# Patient Record
Sex: Female | Born: 1980 | Race: Black or African American | Hispanic: No | Marital: Single | Smoking: Never smoker
Health system: Southern US, Community
[De-identification: ages and names within clinical notes are randomized; demographics above are authoritative.]

## PROBLEM LIST (undated history)

## (undated) ENCOUNTER — Emergency Department (HOSPITAL_COMMUNITY): Admission: EM

## (undated) DIAGNOSIS — D649 Anemia, unspecified: Secondary | ICD-10-CM

## (undated) DIAGNOSIS — M1990 Unspecified osteoarthritis, unspecified site: Secondary | ICD-10-CM

## (undated) DIAGNOSIS — G4730 Sleep apnea, unspecified: Secondary | ICD-10-CM

## (undated) DIAGNOSIS — R7303 Prediabetes: Secondary | ICD-10-CM

## (undated) DIAGNOSIS — J45909 Unspecified asthma, uncomplicated: Secondary | ICD-10-CM

## (undated) DIAGNOSIS — R011 Cardiac murmur, unspecified: Secondary | ICD-10-CM

## (undated) DIAGNOSIS — R519 Headache, unspecified: Secondary | ICD-10-CM

## (undated) DIAGNOSIS — K219 Gastro-esophageal reflux disease without esophagitis: Secondary | ICD-10-CM

## (undated) DIAGNOSIS — R609 Edema, unspecified: Secondary | ICD-10-CM

## (undated) DIAGNOSIS — I10 Essential (primary) hypertension: Secondary | ICD-10-CM

## (undated) DIAGNOSIS — F329 Major depressive disorder, single episode, unspecified: Secondary | ICD-10-CM

## (undated) DIAGNOSIS — R112 Nausea with vomiting, unspecified: Secondary | ICD-10-CM

## (undated) DIAGNOSIS — R51 Headache: Secondary | ICD-10-CM

## (undated) DIAGNOSIS — F32A Depression, unspecified: Secondary | ICD-10-CM

## (undated) DIAGNOSIS — Z9889 Other specified postprocedural states: Secondary | ICD-10-CM

## (undated) DIAGNOSIS — T7840XA Allergy, unspecified, initial encounter: Secondary | ICD-10-CM

## (undated) DIAGNOSIS — IMO0001 Reserved for inherently not codable concepts without codable children: Secondary | ICD-10-CM

## (undated) HISTORY — DX: Essential (primary) hypertension: I10

## (undated) HISTORY — DX: Unspecified osteoarthritis, unspecified site: M19.90

## (undated) HISTORY — DX: Major depressive disorder, single episode, unspecified: F32.9

## (undated) HISTORY — DX: Depression, unspecified: F32.A

## (undated) HISTORY — DX: Prediabetes: R73.03

## (undated) HISTORY — DX: Sleep apnea, unspecified: G47.30

## (undated) HISTORY — DX: Edema, unspecified: R60.9

## (undated) HISTORY — PX: WISDOM TOOTH EXTRACTION: SHX21

## (undated) HISTORY — PX: CHOLECYSTECTOMY: SHX55

## (undated) HISTORY — DX: Cardiac murmur, unspecified: R01.1

## (undated) HISTORY — DX: Allergy, unspecified, initial encounter: T78.40XA

---

## 2000-10-16 ENCOUNTER — Emergency Department (HOSPITAL_COMMUNITY): Admission: EM | Admit: 2000-10-16 | Discharge: 2000-10-16 | Payer: Self-pay

## 2001-03-20 ENCOUNTER — Emergency Department (HOSPITAL_COMMUNITY): Admission: EM | Admit: 2001-03-20 | Discharge: 2001-03-20 | Payer: Self-pay | Admitting: Internal Medicine

## 2002-03-29 ENCOUNTER — Emergency Department (HOSPITAL_COMMUNITY): Admission: EM | Admit: 2002-03-29 | Discharge: 2002-03-30 | Payer: Self-pay | Admitting: Emergency Medicine

## 2003-04-25 ENCOUNTER — Emergency Department (HOSPITAL_COMMUNITY): Admission: EM | Admit: 2003-04-25 | Discharge: 2003-04-25 | Payer: Self-pay | Admitting: Emergency Medicine

## 2004-03-05 ENCOUNTER — Encounter: Admission: RE | Admit: 2004-03-05 | Discharge: 2004-06-03 | Payer: Self-pay | Admitting: Family Medicine

## 2004-11-28 ENCOUNTER — Emergency Department (HOSPITAL_COMMUNITY): Admission: EM | Admit: 2004-11-28 | Discharge: 2004-11-28 | Payer: Self-pay | Admitting: Emergency Medicine

## 2005-03-03 ENCOUNTER — Other Ambulatory Visit: Admission: RE | Admit: 2005-03-03 | Discharge: 2005-03-03 | Payer: Self-pay | Admitting: Family Medicine

## 2005-06-19 ENCOUNTER — Encounter: Admission: RE | Admit: 2005-06-19 | Discharge: 2005-06-19 | Payer: Self-pay | Admitting: Allergy and Immunology

## 2006-11-10 ENCOUNTER — Inpatient Hospital Stay (HOSPITAL_COMMUNITY): Admission: AD | Admit: 2006-11-10 | Discharge: 2006-11-10 | Payer: Self-pay | Admitting: *Deleted

## 2007-02-15 ENCOUNTER — Inpatient Hospital Stay (HOSPITAL_COMMUNITY): Admission: AD | Admit: 2007-02-15 | Discharge: 2007-02-20 | Payer: Self-pay | Admitting: Obstetrics and Gynecology

## 2009-02-01 ENCOUNTER — Emergency Department (HOSPITAL_COMMUNITY): Admission: EM | Admit: 2009-02-01 | Discharge: 2009-02-01 | Payer: Self-pay | Admitting: Emergency Medicine

## 2010-12-23 ENCOUNTER — Encounter: Payer: Self-pay | Admitting: Family Medicine

## 2011-04-18 NOTE — Discharge Summary (Signed)
Amber Daniels, Amber Daniels            ACCOUNT NO.:  [id number]   MEDICAL RECORD NO.:  [id number]          PATIENT TYPE:  INP   LOCATION:  9124                          FACILITY:  [facility]   PHYSICIAN:  Maxie Better, M.D.DATE OF BIRTH:  23-Aug-1981   DATE OF ADMISSION:  02/15/2007  DATE OF DISCHARGE:  02/20/2007                               DISCHARGE SUMMARY   ADMISSION DIAGNOSES:  1. Post dates.  2. Gestational hypertension   DISCHARGE DIAGNOSES:  1. Post dates, delivered.  2. Gestational hypertension.  3. Postoperative anemia.  4. Arrest of dilatation.   PROCEDURE:  Primary cesarean section Kerr hysterotomy.   HISTORY OF PRESENT ILLNESS:  This 30 year old G1, P0 female at 75 and  1/[redacted] weeks gestation who was admitted on February 15, 2007 for induction of  labor secondary to elevated blood pressure and nonreactive nonstress  test.  The patient was 41 weeks and 1 day, blood pressure was 150/100,  cervix was 1, 50%, and -3.  Her prenatal course up to that point was  remarkable.   HOSPITAL COURSE:  The patient was admitted.  PIH labs were obtained,  which were normal.  Cervidil was used.  A low dose Pitocin was also  subsequently used.  The patient subsequently had an epidural for pain  management.  She had a protracted active phase.  She had artificial  rupture of membranes.  Light meconium fluid had been noted.  Internal  monitors were placed.  The patient progressed to 5 cm, edematous, -3, -2  vertex presentation, meconium fluid, contracting every 2 minutes.  Cesarean section was recommended for arrest of dilatation.  Primary C-  section was performed.  Live female from the left occiput posterior  presentation was delivered.  Apgars of 9 and 9.  Normal tubes and  ovaries were noted at the time of surgery.  The patient had an  uncomplicated postoperative course.  Her postoperative labs were notable  for acute blood loss related anemia.  Her hemoglobin was 7.9, white  count of 10,  hematocrit 24.2, platelet count of 169,000.  The patient  was asymptomatic and therefore not transfused.  By postoperative day #3,  the patient had was tolerating a regular diet.  She had iron  supplementation.  Her incision showed no erythema, induration, or  exudate.  She was deemed well  to be discharged home.  Her blood  pressures did not have any problems during the labor or postoperatively.   DISPOSITION:  Home.   CONDITION:  Stable.   DISCHARGE MEDICATIONS:  1. Percocet 1 tablet every 4 hours p.r.n. pain.  2. Niferex Forte 1 p.o. daily.  3. Prenatal vitamins 1 p.o. daily.  4. Motrin 800 mg every 8 hours p.r.n. pain.   DISCHARGE INSTRUCTIONS:  Per the postpartum booklet given.   FOLLOW-UP APPOINTMENT:  At [facility] for staple removal  on February 24, 2007 and for [facility] postpartum care 6 weeks.      Maxie Better, M.D.  Electronically Signed     Kaplan/MEDQ  D:  03/28/2007  T:  03/28/2007  Job:  [id number]

## 2011-04-18 NOTE — Op Note (Signed)
Amber Daniels, SABINA            ACCOUNT NO.:  [id number]   MEDICAL RECORD NO.:  [id number]          PATIENT TYPE:  INP   LOCATION:  9164                          [facility]ACILITY:  [facility]   PHYSICIAN:  Maxie Better, M.D.DATE OF BIRTH:  01-Nov-1981   DATE OF PROCEDURE:  DATE OF DISCHARGE:                               OPERATIVE REPORT   PREOPERATIVE DIAGNOSIS:  Arrest of dilatation, post dates.   PROCEDURE:  Primary  Cesarean section, Kerr hysterotomy.   POSTOPERATIVE DIAGNOSIS:  Arrest of dilatation, post dates, left occiput  posterior presentation.   ANESTHESIA:  Epidural.   SURGEON:  Maxie Better, M.D.   ASSISTANT:  Marlinda Mike, C.N.M.   PROCEDURE:  After adequate epidural anesthesia, the patient was placed  in the  supine position with the left lateral tilt.  She was sterilely  prepped and draped in the usual fashion, and indwelling Foley catheter  was already in place.  Marcaine 0.25% was injected along the planned  Pfannenstiel skin incision.  The Pfannenstiel skin incision was then  made, carried down to the rectus fascia.  The rectus fascia was opened  transversely.  The rectus fascia was carefully dissected off the rectus  muscle in a superior and infection fashion.  The rectus muscle was split  in the midline.  The parietal peritoneum was entered sharply and  extended.  The lower uterine segment was well developed.  Vesicouterine  peritoneum was opened transversely.  The bladder was then bluntly  dissected off the lower uterine segment.  It was placed inferiorly with  a bladder retractor.  A curvilinear low transverse uterine incision was  then made and extended bilaterally with bandage scissors.  Subsequent  delivery of a live female from the left occiput posterior presentation,  was performed.  The baby was noted to have a large caput.  Meconium  previously noted.  The cord was clamped and cut.  The baby was  transferred to the awaiting pediatricians who  assigned  apgars of 9 and  9 at one and five.  The placenta was spontaneous, intact.  The uterine  cavity was cleaned of debris.  The uterine incision had no extension.  The uterine incision was closed in two layers, the first layer with 0  Monocryl running locked stitch, second layer imbricated 0 Monocryl  suture.  Several figure-of-eight sutures have been placed with good  hemostasis noted.  Small bleeders in the inferior aspect of the lower  uterine segment were cauterized.  The abdomen was then copiously  irrigated and suctioned of debris.  The paraicolic gutters were cleaned  of debris.  Normal tubes and ovaries were noted bilaterally.  The  parietal peritoneum was closed with 2-0 Vicryl.  The rectus fascia was  closed with 0 Vicryl x2.  Subcutaneous areas are approximated using a 2-  0 plain suture.  The skin is approximated using Ethicon staples.   SPECIMENS:  Placenta, not sent to pathology.   ESTIMATED BLOOD LOSS:  1000 cc.   INTRAOPERATIVE FLUIDS:  Crystalloid 2800 cc.   URINE OUTPUT:  150 cc of clear yellow urine.   Intraoperatively, the patient received  250 mcg of Hemabate due to  uterine atony noted.  Sponge and instrument counts x2 were correct.   COMPLICATIONS:  None.  The patient tolerated the procedure well.  She  was transferred to the recovery room in stable condition.      Maxie Better, M.D.  Electronically Signed     [place]ary/MEDQ  D:  02/17/2007  T:  02/17/2007  Job:  [id number]

## 2012-04-20 ENCOUNTER — Emergency Department (HOSPITAL_COMMUNITY): Payer: Medicaid Other

## 2012-04-20 ENCOUNTER — Emergency Department (HOSPITAL_COMMUNITY)
Admission: EM | Admit: 2012-04-20 | Discharge: 2012-04-20 | Disposition: A | Discharge status: Home / Self Care | Payer: Medicaid Other | Attending: Emergency Medicine | Admitting: Emergency Medicine

## 2012-04-20 ENCOUNTER — Encounter (HOSPITAL_COMMUNITY): Payer: Self-pay | Admitting: Physical Medicine and Rehabilitation

## 2012-04-20 DIAGNOSIS — S93409A Sprain of unspecified ligament of unspecified ankle, initial encounter: Secondary | ICD-10-CM | POA: Insufficient documentation

## 2012-04-20 DIAGNOSIS — S93609A Unspecified sprain of unspecified foot, initial encounter: Secondary | ICD-10-CM

## 2012-04-20 DIAGNOSIS — R937 Abnormal findings on diagnostic imaging of other parts of musculoskeletal system: Secondary | ICD-10-CM | POA: Insufficient documentation

## 2012-04-20 DIAGNOSIS — M79609 Pain in unspecified limb: Secondary | ICD-10-CM | POA: Insufficient documentation

## 2012-04-20 DIAGNOSIS — M25579 Pain in unspecified ankle and joints of unspecified foot: Secondary | ICD-10-CM | POA: Insufficient documentation

## 2012-04-20 DIAGNOSIS — W101XXA Fall (on)(from) sidewalk curb, initial encounter: Secondary | ICD-10-CM | POA: Insufficient documentation

## 2012-04-20 MED ORDER — HYDROCODONE-ACETAMINOPHEN 5-325 MG PO TABS
1.0000 | ORAL_TABLET | Freq: Once | ORAL | Status: AC
  Administered 2012-04-20: 1 via ORAL
  Filled 2012-04-20: qty 1

## 2012-04-20 MED ORDER — NAPROXEN 500 MG PO TABS: 500.0000 mg | ORAL_TABLET | Freq: Two times a day (BID) | ORAL | Status: AC

## 2012-04-20 MED ORDER — HYDROCODONE-ACETAMINOPHEN 5-325 MG PO TABS: 1.0000 | ORAL_TABLET | Freq: Four times a day (QID) | ORAL | Status: AC | PRN

## 2012-04-20 MED ORDER — IBUPROFEN 800 MG PO TABS
800.0000 mg | ORAL_TABLET | Freq: Once | ORAL | Status: AC
  Administered 2012-04-20: 800 mg via ORAL
  Filled 2012-04-20: qty 1

## 2012-04-20 NOTE — ED Notes (Signed)
Pt presents to department for evaluation of L foot and ankle pain. States she tripped and fell off curb this morning. Unable to wiggle digits at the time, pedal pulses present. No obvious deformity noted. 10/10 pain at the time. She is conscious alert and oriented x4. No signs of distress noted at present.

## 2012-04-20 NOTE — ED Provider Notes (Signed)
History   This chart was scribed for Shelda Jakes, MD by Brooks Sailors. The patient was seen in room STRE7/STRE7. Patient's care was started at 1626.   CSN: [id number]  Arrival date & time 04/20/12  1626   First MD Initiated Contact with Patient 04/20/12 1733      Chief Complaint  Patient presents with  . Foot Pain  . Ankle Pain    (Consider location/radiation/quality/duration/timing/severity/associated sxs/prior treatment) HPI  Amber Daniels is a 31 y.o. female who presents to the Emergency Department complaining of left foot and ankle pain onset this morning 8 hours ago. Pt was walking to the mailbox this morning when she fell and twisted left ankle. Pt says she was walking off the curb of the street when injury occurred. Pt says walking and movement make the pain worse.    No past medical history on file.  No past surgical history on file.  No family history on file.  History  Substance Use Topics  . Smoking status: Never Smoker   . Smokeless tobacco: Not on file  . Alcohol Use: Yes    OB History    Grav Para Term Preterm Abortions TAB SAB Ect Mult Living                  Review of Systems  Constitutional: Negative for fatigue.  HENT: Negative for sore throat.   Respiratory: Negative for cough and shortness of breath.   Cardiovascular: Negative for chest pain.  Gastrointestinal: Negative for nausea, vomiting, abdominal pain and diarrhea.  Genitourinary: Negative for dysuria.  Musculoskeletal: Negative for back pain.  Skin: Negative for rash.  Hematological: Negative for adenopathy.  All other systems reviewed and are negative.    Allergies  Review of patient's allergies indicates no known allergies.  Home Medications   Current Outpatient Rx  Name Route Sig Dispense Refill  . ACETAMINOPHEN 500 MG PO TABS Oral Take 1,500 mg by mouth every 6 (six) hours as needed. For pain.    Marland Kitchen HYDROCODONE-ACETAMINOPHEN 5-325 MG PO TABS Oral Take 1-2 tablets  by mouth every 6 (six) hours as needed for pain. 14 tablet 0  . NAPROXEN 500 MG PO TABS Oral Take 1 tablet (500 mg total) by mouth 2 (two) times daily. 14 tablet 0    BP 134/78  Pulse 77  Temp(Src) 98.4 F (36.9 C) (Oral)  Resp 16  SpO2 100%  LMP 03/29/2012  Physical Exam  Nursing note and vitals reviewed. Constitutional: She is oriented to person, place, and time. She appears well-developed and well-nourished.       obese  HENT:  Head: Normocephalic and atraumatic.  Eyes: Conjunctivae and EOM are normal. Pupils are equal, round, and reactive to light.  Neck: Normal range of motion. Neck supple.  Cardiovascular: Normal rate and regular rhythm.   Pulmonary/Chest: Effort normal and breath sounds normal.  Abdominal: Soft. Bowel sounds are normal.  Musculoskeletal: Normal range of motion.       DP pulse is 2+, cap refill 1 second, sensation intact, can move toes. Left foot  Neurological: She is alert and oriented to person, place, and time.  Skin: Skin is warm and dry.  Psychiatric: She has a normal mood and affect.    ED Course  Procedures (including critical care time)  Pt seen at 1745 Pt informed of xray results at 1851   Labs Reviewed - No data to display Dg Ankle Complete Left  04/20/2012  *RADIOLOGY REPORT*  Clinical Data: Injured  left ankle.  LEFT ANKLE COMPLETE - 3+ VIEW  Comparison: None.  Findings: The lateral ankle mortise is slightly widened.  Cannot exclude a ligamentous injury.  No acute ankle fracture.  The talus is intact.  IMPRESSION: Mild lateral ankle mortise widening.  Possible ligamentous injury.  Original Report Authenticated By: P. Loralie Champagne, M.D.   Ct Foot Left Wo Contrast  04/20/2012  *RADIOLOGY REPORT*  Clinical Data: Abnormal imaging of the left foot earlier, possible fracture involving the base of the second metatarsal.  CT OF THE LEFT FOOT WITHOUT CONTRAST  Technique:  Multidetector CT imaging was performed according to the standard protocol.  Multiplanar CT image reconstructions were also generated.  Comparison: Left foot x-rays obtained earlier same date.  Findings: No fracture identified involving the base of the second metatarsal or elsewhere involving the bones of the foot.  Ankle mortise intact, and no fractures identified about the ankle.  Joint spaces well preserved.  IMPRESSION: No fracture identified involving the base of the second metatarsal as questioned on earlier imaging.  No fractures elsewhere.  Original Report Authenticated By: Arnell Sieving, M.D.   Dg Foot Complete Left  04/20/2012  *RADIOLOGY REPORT*  Clinical Data: Larey Seat.  Left foot pain.  LEFT FOOT - COMPLETE 3+ VIEW  Comparison: None  Findings: The joint spaces are maintained.  Suspect fractures of the base of the second metatarsal.  No obvious Lisfranc's injury but CT suggested for further evaluation.  The metatarsal phalangeal joints are normal.  IMPRESSION:  Possible second metatarsal base fractures.  Recommend CT for further evaluation.  Original Report Authenticated By: P. Loralie Champagne, M.D.     1. Ankle sprain   2. Foot sprain       MDM  Followup with orthopedics.  CT scan shows no fracture to the foot.  Read your x-rays of the ankle were determined to be normal, possible ligamental injury, but no fracture.  Will treat as a bad ankle sprain foot sprain with crutches, ASO as needed and referral to orthopedics.      I personally performed the services described in this documentation, which was scribed in my presence. The recorded information has been reviewed and considered.     Shelda Jakes, MD 04/20/12 2024

## 2012-04-20 NOTE — Discharge Instructions (Signed)
Ankle Sprain An ankle sprain happens when the bands of tissue that hold the ankle bones together (ligaments) stretch too much and tear.  HOME CARE   Put ice on the ankle for 15 to 20 minutes, 3 to 4 times a day.   Put ice in a plastic bag.   Place a towel between your skin and the bag.   You may stop icing when the puffiness (swelling) goes down.   Raise (elevate) the injured ankle to lessen puffiness.   Use crutches if your doctor tells you to. Use them until you can walk without pain.   If a plaster splint was applied:   Rest the plaster splint on nothing harder than a pillow for 24 hours.   Do not put weight on it.   Do not get it wet.   Take it off to shower or bathe.   Follow up with your doctor.   Use an elastic wrap for support. Take the wrap off if the toes lose feeling (numb), tingle, or turn cold or blue.   If an air splint was applied:   Add or release air to make it comfortable.   Take it off at night and to shower and bathe.   Wiggle your toes while wearing the air splint.   Only take medicine as told by your doctor.   Do not drive until your doctor says it is okay.  GET HELP RIGHT AWAY IF:   You have more bruising, puffiness, or pain.   Your toes feel cold.   Your medicine does not help lessen your pain.   You are losing feeling in your toes or they turn blue.   You have severe pain.  MAKE SURE YOU:   Understand these instructions.   Will watch your condition.   Will get help right away if you are not doing well or get worse.  Document Released: 05/05/2008 Document Revised: 11/06/2011 Document Reviewed: 05/05/2008 [facility] Patient Information 2012 [place], [place].  Use crutches as needed.  X-rays and CAT scan of ankle, and foot without any evidence of acute fracture.  So, therefore, it is a bad sprain.  Followup with orthopedics.  Usually associate went to help support the ankle is okay to try to weight-bear and walk as tolerated.  Is too  uncomfortable.  Continue use of crutches.  He would need to give orthopedics a call for appointment time

## 2012-04-20 NOTE — ED Notes (Signed)
Unable to check temp due to pt having something cold to drink.

## 2012-04-20 NOTE — Progress Notes (Signed)
Orthopedic Tech Progress Note Patient Details:  Amber Daniels [birth year] [id number]  Other Ortho Devices Type of Ortho Device: ASO;Crutches Ortho Device Location: left ankle Ortho Device Interventions: Application   Nikki Dom 04/20/2012, 8:43 PM

## 2012-07-05 ENCOUNTER — Encounter (HOSPITAL_BASED_OUTPATIENT_CLINIC_OR_DEPARTMENT_OTHER): Payer: Self-pay | Admitting: *Deleted

## 2012-07-05 ENCOUNTER — Emergency Department (HOSPITAL_BASED_OUTPATIENT_CLINIC_OR_DEPARTMENT_OTHER)
Admission: EM | Admit: 2012-07-05 | Discharge: 2012-07-05 | Disposition: A | Discharge status: Home / Self Care | Payer: Medicaid Other | Attending: Emergency Medicine | Admitting: Emergency Medicine

## 2012-07-05 DIAGNOSIS — Z113 Encounter for screening for infections with a predominantly sexual mode of transmission: Secondary | ICD-10-CM | POA: Insufficient documentation

## 2012-07-05 DIAGNOSIS — J039 Acute tonsillitis, unspecified: Secondary | ICD-10-CM | POA: Insufficient documentation

## 2012-07-05 DIAGNOSIS — J45909 Unspecified asthma, uncomplicated: Secondary | ICD-10-CM | POA: Insufficient documentation

## 2012-07-05 HISTORY — DX: Unspecified asthma, uncomplicated: J45.909

## 2012-07-05 LAB — RAPID STREP SCREEN (MED CTR MEBANE ONLY): Streptococcus, Group A Screen (Direct): NEGATIVE

## 2012-07-05 MED ORDER — PENICILLIN G BENZATHINE 1200000 UNIT/2ML IM SUSP
1.2000 10*6.[IU] | Freq: Once | INTRAMUSCULAR | Status: AC
  Administered 2012-07-05: 1.2 10*6.[IU] via INTRAMUSCULAR
  Filled 2012-07-05: qty 2

## 2012-07-05 NOTE — ED Provider Notes (Signed)
History     CSN: [id number]  Arrival date & time 07/05/12  1300   First MD Initiated Contact with Patient 07/05/12 1404      Chief Complaint  Patient presents with  . Sore Throat    (Consider location/radiation/quality/duration/timing/severity/associated sxs/prior treatment) Patient is a 31 y.o. female presenting with pharyngitis. The history is provided by the patient.  Sore Throat This is a new problem. The current episode started yesterday. The problem occurs constantly. The problem has been gradually worsening. Associated symptoms include chills, myalgias and a sore throat. Pertinent negatives include no congestion, coughing, fever, nausea or rash. Associated symptoms comments: Feels generally achy, chills, sore throat that is worse today. No sick contacts. No cough.. The symptoms are aggravated by swallowing.    Past Medical History  Diagnosis Date  . Asthma     Past Surgical History  Procedure Date  . Cesarean section     No family history on file.  History  Substance Use Topics  . Smoking status: Never Smoker   . Smokeless tobacco: Not on file  . Alcohol Use: Yes    OB History    Grav Para Term Preterm Abortions TAB SAB Ect Mult Living                  Review of Systems  Constitutional: Positive for chills. Negative for fever.  HENT: Positive for sore throat. Negative for congestion and trouble swallowing.   Respiratory: Negative for cough.   Gastrointestinal: Negative for nausea.  Musculoskeletal: Positive for myalgias.  Skin: Negative for rash.    Allergies  Review of patient's allergies indicates no known allergies.  Home Medications   Current Outpatient Rx  Name Route Sig Dispense Refill  . ACETAMINOPHEN 500 MG PO TABS Oral Take 1,500 mg by mouth every 6 (six) hours as needed. For pain.    Marland Kitchen NAPROXEN 500 MG PO TABS Oral Take 1 tablet (500 mg total) by mouth 2 (two) times daily. 14 tablet 0    BP 135/89  Pulse 86  Temp 98.4 F (36.9 C)  (Oral)  Resp 20  SpO2 100%  Physical Exam  Constitutional: She appears well-developed and well-nourished.  HENT:  Head: Normocephalic.  Right Ear: External ear normal.  Left Ear: External ear normal.  Nose: Mucosal edema present. Right sinus exhibits frontal sinus tenderness. Left sinus exhibits frontal sinus tenderness.  Mouth/Throat: Oropharyngeal exudate present.  Neck: Normal range of motion. Neck supple.  Cardiovascular: Normal rate and normal heart sounds.   No murmur heard. Pulmonary/Chest: Effort normal and breath sounds normal. She has no wheezes. She has no rales.  Abdominal: Soft. Bowel sounds are normal. She exhibits no distension. There is no tenderness.  Musculoskeletal: Normal range of motion.  Lymphadenopathy:    She has cervical adenopathy.  Skin: Skin is warm and dry. No pallor.    ED Course  Procedures (including critical care time)   Labs Reviewed  RAPID STREP SCREEN   No results found.   No diagnosis found.  1. Exudative tonsillitis  MDM  Negative rapid strep, however, tonsillar erythema with exudate appears concerning for bacterial infection. Given Bicillin. Patient had concern regarding oral sex with boyfriend and possible STD infection as cause of her symptoms. She denies genital symptoms. Oral swab for GC done.         Rodena Medin, PA-C 07/05/12 [phone]

## 2012-07-05 NOTE — ED Notes (Signed)
Pt. Questioning about poss. Sexually transmitted disease in the throat.  RN Earlene Plater has inquired to EDP and she will speak with DR

## 2012-07-05 NOTE — ED Provider Notes (Signed)
Medical screening examination/treatment/procedure(s) were performed by non-physician practitioner and as supervising physician I was immediately available for consultation/collaboration.  Ethelda Chick, MD 07/05/12 [phone]

## 2012-07-05 NOTE — ED Notes (Signed)
Sore throat x 2 days. Woke this am with white exudate on her left tonsil.

## 2013-03-28 LAB — OB RESULTS CONSOLE HIV ANTIBODY (ROUTINE TESTING): HIV: NONREACTIVE

## 2013-03-28 LAB — OB RESULTS CONSOLE RPR: RPR: NONREACTIVE

## 2013-03-28 LAB — OB RESULTS CONSOLE ABO/RH: RH Type: POSITIVE

## 2013-03-28 LAB — OB RESULTS CONSOLE HEPATITIS B SURFACE ANTIGEN: Hepatitis B Surface Ag: NEGATIVE

## 2013-03-28 LAB — OB RESULTS CONSOLE RUBELLA ANTIBODY, IGM: Rubella: IMMUNE

## 2013-10-14 ENCOUNTER — Other Ambulatory Visit: Payer: Self-pay | Admitting: Obstetrics and Gynecology

## 2013-10-18 ENCOUNTER — Encounter (HOSPITAL_COMMUNITY): Payer: Self-pay | Admitting: Pharmacist

## 2013-10-31 ENCOUNTER — Encounter (HOSPITAL_COMMUNITY): Payer: Self-pay

## 2013-10-31 ENCOUNTER — Encounter (HOSPITAL_COMMUNITY)
Admission: RE | Admit: 2013-10-31 | Discharge: 2013-10-31 | Disposition: A | Discharge status: Home / Self Care | Payer: 59 | Source: Ambulatory Visit | Attending: Obstetrics and Gynecology | Admitting: Obstetrics and Gynecology

## 2013-10-31 LAB — TYPE AND SCREEN
ABO/RH(D): O POS
Antibody Screen: NEGATIVE

## 2013-10-31 LAB — CBC
Hemoglobin: 11.6 g/dL — ABNORMAL LOW (ref 12.0–15.0)
MCHC: 32.2 g/dL (ref 30.0–36.0)
RBC: 4.22 MIL/uL (ref 3.87–5.11)
WBC: 9.4 10*3/uL (ref 4.0–10.5)

## 2013-10-31 LAB — ABO/RH: ABO/RH(D): O POS

## 2013-10-31 LAB — RPR: RPR Ser Ql: NONREACTIVE

## 2013-10-31 MED ORDER — DEXTROSE 5 % IV SOLN
3.0000 g | INTRAVENOUS | Status: AC
  Administered 2013-11-01: 3 g via INTRAVENOUS
  Filled 2013-10-31: qty 3000

## 2013-10-31 NOTE — Patient Instructions (Signed)
20 Raymona Boss  10/31/2013   Your procedure is scheduled on:  11/01/13  Enter through the Main Entrance of [facility] at 745 AM.  Pick up the phone at the desk and dial [phone].   Call this number if you have problems the morning of surgery: [phone]   Remember:   Do not eat food:After Midnight.  Do not drink clear liquids: After Midnight.  Take these medicines the morning of surgery with A SIP OF WATER: Bring inhalers   Do not wear jewelry, make-up or nail polish.  Do not wear lotions, powders, or perfumes. You may wear deodorant.  Do not shave 48 hours prior to surgery.  Do not bring valuables to the hospital.  [facility] is not   responsible for any belongings or valuables brought to the hospital.  Contacts, dentures or bridgework may not be worn into surgery.  Leave suitcase in the car. After surgery it may be brought to your room.  For patients admitted to the hospital, checkout time is 11:00 AM the day of              discharge.   Patients discharged the day of surgery will not be allowed to drive             home.  Name and phone number of your driver: NA  Special Instructions:   Shower using CHG 2 nights before surgery and the night before surgery.  If you shower the day of surgery use CHG.  Use special wash - you have one bottle of CHG for all showers.  You should use approximately 1/3 of the bottle for each shower.   Please read over the following fact sheets that you were given:   Surgical Site Infection Prevention

## 2013-11-01 ENCOUNTER — Inpatient Hospital Stay (HOSPITAL_COMMUNITY): Payer: 59 | Admitting: Anesthesiology

## 2013-11-01 ENCOUNTER — Encounter (HOSPITAL_COMMUNITY): Payer: 59 | Admitting: Anesthesiology

## 2013-11-01 ENCOUNTER — Encounter (HOSPITAL_COMMUNITY): Payer: Self-pay | Admitting: *Deleted

## 2013-11-01 ENCOUNTER — Encounter (HOSPITAL_COMMUNITY)
Admission: AD | Disposition: A | Discharge status: Home / Self Care | Payer: Self-pay | Source: Ambulatory Visit | Attending: Obstetrics and Gynecology

## 2013-11-01 ENCOUNTER — Inpatient Hospital Stay (HOSPITAL_COMMUNITY)
Admission: AD | Admit: 2013-11-01 | Discharge: 2013-11-04 | DRG: 765 | Disposition: A | Discharge status: Home / Self Care | Payer: 59 | Source: Ambulatory Visit | Attending: Obstetrics and Gynecology | Admitting: Obstetrics and Gynecology

## 2013-11-01 DIAGNOSIS — E669 Obesity, unspecified: Secondary | ICD-10-CM | POA: Diagnosis present

## 2013-11-01 DIAGNOSIS — N736 Female pelvic peritoneal adhesions (postinfective): Secondary | ICD-10-CM | POA: Diagnosis present

## 2013-11-01 DIAGNOSIS — O9903 Anemia complicating the puerperium: Secondary | ICD-10-CM | POA: Diagnosis not present

## 2013-11-01 DIAGNOSIS — O99892 Other specified diseases and conditions complicating childbirth: Secondary | ICD-10-CM | POA: Diagnosis present

## 2013-11-01 DIAGNOSIS — D62 Acute posthemorrhagic anemia: Secondary | ICD-10-CM | POA: Diagnosis not present

## 2013-11-01 DIAGNOSIS — O34219 Maternal care for unspecified type scar from previous cesarean delivery: Principal | ICD-10-CM | POA: Diagnosis present

## 2013-11-01 SURGERY — Surgical Case
Anesthesia: Spinal | Site: Abdomen

## 2013-11-01 MED ORDER — SIMETHICONE 80 MG PO CHEW: 80.0000 mg | CHEWABLE_TABLET | ORAL | Status: DC | PRN

## 2013-11-01 MED ORDER — SIMETHICONE 80 MG PO CHEW
80.0000 mg | CHEWABLE_TABLET | Freq: Three times a day (TID) | ORAL | Status: DC
  Administered 2013-11-01 – 2013-11-04 (×9): 80 mg via ORAL
  Filled 2013-11-01 (×8): qty 1

## 2013-11-01 MED ORDER — PNEUMOCOCCAL VAC POLYVALENT 25 MCG/0.5ML IJ INJ
0.5000 mL | INJECTION | INTRAMUSCULAR | Status: AC
  Administered 2013-11-04: 0.5 mL via INTRAMUSCULAR
  Filled 2013-11-01: qty 0.5

## 2013-11-01 MED ORDER — OXYTOCIN 10 UNIT/ML IJ SOLN
40.0000 [IU] | INTRAVENOUS | Status: DC | PRN
  Administered 2013-11-01: 40 [IU] via INTRAVENOUS

## 2013-11-01 MED ORDER — SCOPOLAMINE 1 MG/3DAYS TD PT72
1.0000 | MEDICATED_PATCH | Freq: Once | TRANSDERMAL | Status: DC
  Administered 2013-11-01: 1.5 mg via TRANSDERMAL

## 2013-11-01 MED ORDER — HYDROMORPHONE HCL PF 1 MG/ML IJ SOLN: 0.2500 mg | INTRAMUSCULAR | Status: DC | PRN

## 2013-11-01 MED ORDER — SIMETHICONE 80 MG PO CHEW
80.0000 mg | CHEWABLE_TABLET | ORAL | Status: DC
  Administered 2013-11-01 – 2013-11-03 (×2): 80 mg via ORAL
  Filled 2013-11-01 (×3): qty 1

## 2013-11-01 MED ORDER — SODIUM CHLORIDE 0.9 % IJ SOLN: 3.0000 mL | Freq: Two times a day (BID) | INTRAMUSCULAR | Status: DC

## 2013-11-01 MED ORDER — DIPHENHYDRAMINE HCL 25 MG PO CAPS: 25.0000 mg | ORAL_CAPSULE | Freq: Four times a day (QID) | ORAL | Status: DC | PRN

## 2013-11-01 MED ORDER — LACTATED RINGERS IV SOLN
INTRAVENOUS | Status: DC
  Administered 2013-11-01 (×3): via INTRAVENOUS

## 2013-11-01 MED ORDER — PHENYLEPHRINE HCL 10 MG/ML IJ SOLN
INTRAMUSCULAR | Status: DC | PRN
  Administered 2013-11-01: 80 ug via INTRAVENOUS

## 2013-11-01 MED ORDER — MORPHINE SULFATE (PF) 0.5 MG/ML IJ SOLN
INTRAMUSCULAR | Status: DC | PRN
  Administered 2013-11-01: 4 mg via EPIDURAL

## 2013-11-01 MED ORDER — ARTIFICIAL TEARS OP OINT
TOPICAL_OINTMENT | OPHTHALMIC | Status: AC
  Filled 2013-11-01: qty 3.5

## 2013-11-01 MED ORDER — PRENATAL MULTIVITAMIN CH
1.0000 | ORAL_TABLET | Freq: Every day | ORAL | Status: DC
  Administered 2013-11-03 – 2013-11-04 (×2): 1 via ORAL
  Filled 2013-11-01 (×3): qty 1

## 2013-11-01 MED ORDER — LACTATED RINGERS IV SOLN
INTRAVENOUS | Status: DC
  Administered 2013-11-01: 18:00:00 via INTRAVENOUS

## 2013-11-01 MED ORDER — FLUTICASONE PROPIONATE HFA 44 MCG/ACT IN AERO
1.0000 | INHALATION_SPRAY | Freq: Two times a day (BID) | RESPIRATORY_TRACT | Status: DC
  Administered 2013-11-01 – 2013-11-04 (×6): 1 via RESPIRATORY_TRACT
  Filled 2013-11-01: qty 10.6

## 2013-11-01 MED ORDER — LACTATED RINGERS IV SOLN
INTRAVENOUS | Status: DC | PRN
  Administered 2013-11-01: 10:00:00 via INTRAVENOUS

## 2013-11-01 MED ORDER — KETOROLAC TROMETHAMINE 30 MG/ML IJ SOLN: 15.0000 mg | Freq: Once | INTRAMUSCULAR | Status: AC | PRN

## 2013-11-01 MED ORDER — OXYCODONE-ACETAMINOPHEN 5-325 MG PO TABS
1.0000 | ORAL_TABLET | ORAL | Status: DC | PRN
  Administered 2013-11-01 – 2013-11-03 (×6): 2 via ORAL
  Administered 2013-11-03: 1 via ORAL
  Administered 2013-11-03 – 2013-11-04 (×3): 2 via ORAL
  Filled 2013-11-01 (×5): qty 2
  Filled 2013-11-01: qty 1
  Filled 2013-11-01 (×4): qty 2

## 2013-11-01 MED ORDER — SCOPOLAMINE 1 MG/3DAYS TD PT72
MEDICATED_PATCH | TRANSDERMAL | Status: AC
  Administered 2013-11-01: 1.5 mg via TRANSDERMAL
  Filled 2013-11-01: qty 1

## 2013-11-01 MED ORDER — METHYLERGONOVINE MALEATE 0.2 MG/ML IJ SOLN: 0.2000 mg | INTRAMUSCULAR | Status: DC | PRN

## 2013-11-01 MED ORDER — OXYTOCIN 40 UNITS IN LACTATED RINGERS INFUSION - SIMPLE MED: 62.5000 mL/h | INTRAVENOUS | Status: AC

## 2013-11-01 MED ORDER — ONDANSETRON HCL 4 MG/2ML IJ SOLN
INTRAMUSCULAR | Status: AC
  Filled 2013-11-01: qty 2

## 2013-11-01 MED ORDER — SODIUM BICARBONATE 8.4 % IV SOLN
INTRAVENOUS | Status: DC | PRN
  Administered 2013-11-01: 5 mL via EPIDURAL
  Administered 2013-11-01 (×2): 3 mL via EPIDURAL

## 2013-11-01 MED ORDER — FLEET ENEMA 7-19 GM/118ML RE ENEM: 1.0000 | ENEMA | Freq: Every day | RECTAL | Status: DC | PRN

## 2013-11-01 MED ORDER — LIDOCAINE-EPINEPHRINE (PF) 2 %-1:200000 IJ SOLN
INTRAMUSCULAR | Status: AC
  Filled 2013-11-01: qty 20

## 2013-11-01 MED ORDER — SENNOSIDES-DOCUSATE SODIUM 8.6-50 MG PO TABS
2.0000 | ORAL_TABLET | ORAL | Status: DC
  Administered 2013-11-01 – 2013-11-03 (×3): 2 via ORAL
  Filled 2013-11-01 (×3): qty 2

## 2013-11-01 MED ORDER — PHENYLEPHRINE 40 MCG/ML (10ML) SYRINGE FOR IV PUSH (FOR BLOOD PRESSURE SUPPORT)
PREFILLED_SYRINGE | INTRAVENOUS | Status: AC
  Filled 2013-11-01: qty 5

## 2013-11-01 MED ORDER — LACTATED RINGERS IV SOLN
Freq: Once | INTRAVENOUS | Status: AC
  Administered 2013-11-01: 09:00:00 via INTRAVENOUS

## 2013-11-01 MED ORDER — SODIUM CHLORIDE 0.9 % IJ SOLN: 3.0000 mL | INTRAMUSCULAR | Status: DC | PRN

## 2013-11-01 MED ORDER — MEPERIDINE HCL 25 MG/ML IJ SOLN: 6.2500 mg | INTRAMUSCULAR | Status: DC | PRN

## 2013-11-01 MED ORDER — ONDANSETRON HCL 4 MG/2ML IJ SOLN: 4.0000 mg | INTRAMUSCULAR | Status: DC | PRN

## 2013-11-01 MED ORDER — NALBUPHINE HCL 10 MG/ML IJ SOLN
5.0000 mg | INTRAMUSCULAR | Status: DC | PRN
Start: 2013-11-01 — End: 2013-11-04
  Administered 2013-11-01: 10 mg via SUBCUTANEOUS
  Filled 2013-11-01 (×3): qty 1

## 2013-11-01 MED ORDER — NALOXONE HCL 0.4 MG/ML IJ SOLN: 0.4000 mg | INTRAMUSCULAR | Status: DC | PRN

## 2013-11-01 MED ORDER — WITCH HAZEL-GLYCERIN EX PADS: 1.0000 "application " | MEDICATED_PAD | CUTANEOUS | Status: DC | PRN

## 2013-11-01 MED ORDER — NALBUPHINE HCL 10 MG/ML IJ SOLN
5.0000 mg | INTRAMUSCULAR | Status: DC | PRN
  Administered 2013-11-01: 10 mg via INTRAVENOUS
  Filled 2013-11-01 (×2): qty 1

## 2013-11-01 MED ORDER — FENTANYL CITRATE 0.05 MG/ML IJ SOLN
INTRAMUSCULAR | Status: DC | PRN
  Administered 2013-11-01: 100 ug via EPIDURAL

## 2013-11-01 MED ORDER — ONDANSETRON HCL 4 MG PO TABS: 4.0000 mg | ORAL_TABLET | ORAL | Status: DC | PRN

## 2013-11-01 MED ORDER — SODIUM BICARBONATE 8.4 % IV SOLN
INTRAVENOUS | Status: AC
  Filled 2013-11-01: qty 50

## 2013-11-01 MED ORDER — ONDANSETRON HCL 4 MG/2ML IJ SOLN: 4.0000 mg | Freq: Three times a day (TID) | INTRAMUSCULAR | Status: DC | PRN

## 2013-11-01 MED ORDER — MORPHINE SULFATE 0.5 MG/ML IJ SOLN
INTRAMUSCULAR | Status: AC
  Filled 2013-11-01: qty 10

## 2013-11-01 MED ORDER — PROMETHAZINE HCL 25 MG/ML IJ SOLN: 6.2500 mg | INTRAMUSCULAR | Status: DC | PRN

## 2013-11-01 MED ORDER — BUPIVACAINE HCL (PF) 0.25 % IJ SOLN
INTRAMUSCULAR | Status: AC
  Filled 2013-11-01: qty 30

## 2013-11-01 MED ORDER — PHENYLEPHRINE 8 MG IN D5W 100 ML (0.08MG/ML) PREMIX OPTIME
INJECTION | INTRAVENOUS | Status: DC | PRN
  Administered 2013-11-01: 60 ug/min via INTRAVENOUS

## 2013-11-01 MED ORDER — SODIUM CHLORIDE 0.9 % IV SOLN: 250.0000 mL | INTRAVENOUS | Status: DC

## 2013-11-01 MED ORDER — DIPHENHYDRAMINE HCL 50 MG/ML IJ SOLN: 12.5000 mg | INTRAMUSCULAR | Status: DC | PRN

## 2013-11-01 MED ORDER — KETOROLAC TROMETHAMINE 30 MG/ML IJ SOLN
30.0000 mg | Freq: Four times a day (QID) | INTRAMUSCULAR | Status: AC | PRN
  Administered 2013-11-01: 30 mg via INTRAMUSCULAR

## 2013-11-01 MED ORDER — MENTHOL 3 MG MT LOZG: 1.0000 | LOZENGE | OROMUCOSAL | Status: DC | PRN

## 2013-11-01 MED ORDER — EPHEDRINE 5 MG/ML INJ
INTRAVENOUS | Status: AC
  Filled 2013-11-01: qty 10

## 2013-11-01 MED ORDER — METHYLERGONOVINE MALEATE 0.2 MG PO TABS: 0.2000 mg | ORAL_TABLET | ORAL | Status: DC | PRN

## 2013-11-01 MED ORDER — PHENYLEPHRINE HCL 10 MG/ML IJ SOLN
INTRAMUSCULAR | Status: AC
  Filled 2013-11-01: qty 1

## 2013-11-01 MED ORDER — LANOLIN HYDROUS EX OINT: 1.0000 "application " | TOPICAL_OINTMENT | CUTANEOUS | Status: DC | PRN

## 2013-11-01 MED ORDER — DIBUCAINE 1 % RE OINT: 1.0000 "application " | TOPICAL_OINTMENT | RECTAL | Status: DC | PRN

## 2013-11-01 MED ORDER — ALBUTEROL SULFATE HFA 108 (90 BASE) MCG/ACT IN AERS
2.0000 | INHALATION_SPRAY | RESPIRATORY_TRACT | Status: DC | PRN
  Filled 2013-11-01: qty 6.7

## 2013-11-01 MED ORDER — OXYTOCIN 10 UNIT/ML IJ SOLN
INTRAMUSCULAR | Status: AC
  Filled 2013-11-01: qty 4

## 2013-11-01 MED ORDER — KETOROLAC TROMETHAMINE 30 MG/ML IJ SOLN
INTRAMUSCULAR | Status: AC
  Filled 2013-11-01: qty 1

## 2013-11-01 MED ORDER — NALOXONE HCL 1 MG/ML IJ SOLN
1.0000 ug/kg/h | INTRAVENOUS | Status: DC | PRN
  Filled 2013-11-01: qty 2

## 2013-11-01 MED ORDER — MORPHINE SULFATE (PF) 0.5 MG/ML IJ SOLN
INTRAMUSCULAR | Status: DC | PRN
  Administered 2013-11-01: 1 mg via INTRAVENOUS

## 2013-11-01 MED ORDER — BUPIVACAINE IN DEXTROSE 0.75-8.25 % IT SOLN
INTRATHECAL | Status: DC | PRN
  Administered 2013-11-01: 10.5 mg via INTRATHECAL

## 2013-11-01 MED ORDER — IBUPROFEN 600 MG PO TABS: 600.0000 mg | ORAL_TABLET | Freq: Four times a day (QID) | ORAL | Status: DC | PRN

## 2013-11-01 MED ORDER — BISACODYL 10 MG RE SUPP: 10.0000 mg | Freq: Every day | RECTAL | Status: DC | PRN

## 2013-11-01 MED ORDER — DIPHENHYDRAMINE HCL 50 MG/ML IJ SOLN: 25.0000 mg | INTRAMUSCULAR | Status: DC | PRN

## 2013-11-01 MED ORDER — DIPHENHYDRAMINE HCL 25 MG PO CAPS
25.0000 mg | ORAL_CAPSULE | ORAL | Status: DC | PRN
  Administered 2013-11-02: 25 mg via ORAL
  Filled 2013-11-01 (×2): qty 1

## 2013-11-01 MED ORDER — FENTANYL CITRATE 0.05 MG/ML IJ SOLN
INTRAMUSCULAR | Status: AC
  Filled 2013-11-01: qty 2

## 2013-11-01 MED ORDER — ZOLPIDEM TARTRATE 5 MG PO TABS: 5.0000 mg | ORAL_TABLET | Freq: Every evening | ORAL | Status: DC | PRN

## 2013-11-01 MED ORDER — ONDANSETRON HCL 4 MG/2ML IJ SOLN
INTRAMUSCULAR | Status: DC | PRN
  Administered 2013-11-01: 4 mg via INTRAVENOUS

## 2013-11-01 MED ORDER — KETOROLAC TROMETHAMINE 30 MG/ML IJ SOLN: 30.0000 mg | Freq: Four times a day (QID) | INTRAMUSCULAR | Status: AC | PRN

## 2013-11-01 MED ORDER — METOCLOPRAMIDE HCL 5 MG/ML IJ SOLN: 10.0000 mg | Freq: Three times a day (TID) | INTRAMUSCULAR | Status: DC | PRN

## 2013-11-01 MED ORDER — EPHEDRINE SULFATE 50 MG/ML IJ SOLN
INTRAMUSCULAR | Status: DC | PRN
  Administered 2013-11-01 (×2): 10 mg via INTRAVENOUS

## 2013-11-01 MED ORDER — BUPIVACAINE HCL (PF) 0.25 % IJ SOLN
INTRAMUSCULAR | Status: DC | PRN
  Administered 2013-11-01: 10 mL

## 2013-11-01 MED ORDER — FERROUS SULFATE 325 (65 FE) MG PO TABS
325.0000 mg | ORAL_TABLET | Freq: Two times a day (BID) | ORAL | Status: DC
  Administered 2013-11-01 – 2013-11-04 (×6): 325 mg via ORAL
  Filled 2013-11-01 (×6): qty 1

## 2013-11-01 MED ORDER — BUPIVACAINE IN DEXTROSE 0.75-8.25 % IT SOLN
INTRATHECAL | Status: AC
  Filled 2013-11-01: qty 2

## 2013-11-01 MED ORDER — IBUPROFEN 600 MG PO TABS
600.0000 mg | ORAL_TABLET | Freq: Four times a day (QID) | ORAL | Status: DC
  Administered 2013-11-01 – 2013-11-04 (×12): 600 mg via ORAL
  Filled 2013-11-01 (×12): qty 1

## 2013-11-01 SURGICAL SUPPLY — 48 items
APL SKNCLS STERI-STRIP NONHPOA (GAUZE/BANDAGES/DRESSINGS) ×1
BARRIER ADHS 3X4 INTERCEED (GAUZE/BANDAGES/DRESSINGS) ×2 IMPLANT
BENZOIN TINCTURE PRP APPL 2/3 (GAUZE/BANDAGES/DRESSINGS) ×1 IMPLANT
BRR ADH 4X3 ABS CNTRL BYND (GAUZE/BANDAGES/DRESSINGS) ×1
CLAMP CORD UMBIL (MISCELLANEOUS) ×1 IMPLANT
CLOTH BEACON ORANGE TIMEOUT ST (SAFETY) ×2 IMPLANT
CONTAINER PREFILL 10% NBF 15ML (MISCELLANEOUS) IMPLANT
DRAPE LG THREE QUARTER DISP (DRAPES) IMPLANT
DRSG OPSITE POSTOP 4X10 (GAUZE/BANDAGES/DRESSINGS) ×2 IMPLANT
DURAPREP 26ML APPLICATOR (WOUND CARE) ×2 IMPLANT
ELECT REM PT RETURN 9FT ADLT (ELECTROSURGICAL) ×2
ELECTRODE REM PT RTRN 9FT ADLT (ELECTROSURGICAL) ×1 IMPLANT
EXTRACTOR VACUUM M CUP 4 TUBE (SUCTIONS) IMPLANT
GLOVE BIO SURGEON STRL SZ7 (GLOVE) ×1 IMPLANT
GLOVE BIO SURGEON STRL SZ8.5 (GLOVE) ×1 IMPLANT
GLOVE BIOGEL PI IND STRL 7.0 (GLOVE) ×1 IMPLANT
GLOVE BIOGEL PI IND STRL 8.5 (GLOVE) IMPLANT
GLOVE BIOGEL PI INDICATOR 7.0 (GLOVE) ×4
GLOVE BIOGEL PI INDICATOR 8.5 (GLOVE) ×2
GLOVE ECLIPSE 6.5 STRL STRAW (GLOVE) ×2 IMPLANT
GOWN PREVENTION PLUS XLARGE (GOWN DISPOSABLE) ×4 IMPLANT
GOWN STRL REIN XL XLG (GOWN DISPOSABLE) ×4 IMPLANT
KIT ABG SYR 3ML LUER SLIP (SYRINGE) IMPLANT
NDL HYPO 25X1 1.5 SAFETY (NEEDLE) ×1 IMPLANT
NDL HYPO 25X5/8 SAFETYGLIDE (NEEDLE) IMPLANT
NEEDLE HYPO 25X1 1.5 SAFETY (NEEDLE) ×2 IMPLANT
NEEDLE HYPO 25X5/8 SAFETYGLIDE (NEEDLE) ×2 IMPLANT
NS IRRIG 1000ML POUR BTL (IV SOLUTION) ×2 IMPLANT
PACK C SECTION WH (CUSTOM PROCEDURE TRAY) ×2 IMPLANT
PAD OB MATERNITY 4.3X12.25 (PERSONAL CARE ITEMS) ×2 IMPLANT
RTRCTR C-SECT PINK 25CM LRG (MISCELLANEOUS) IMPLANT
SPONGE LAP 18X18 X RAY DECT (DISPOSABLE) ×1 IMPLANT
STAPLER VISISTAT 35W (STAPLE) IMPLANT
STRIP CLOSURE SKIN 1/2X4 (GAUZE/BANDAGES/DRESSINGS) IMPLANT
SUT CHROMIC GUT AB #0 18 (SUTURE) IMPLANT
SUT MNCRL 0 VIOLET CTX 36 (SUTURE) ×3 IMPLANT
SUT MON AB 4-0 PS1 27 (SUTURE) IMPLANT
SUT MONOCRYL 0 CTX 36 (SUTURE) ×3
SUT PLAIN 2 0 (SUTURE)
SUT PLAIN 2 0 XLH (SUTURE) ×1 IMPLANT
SUT PLAIN ABS 2-0 CT1 27XMFL (SUTURE) IMPLANT
SUT VIC AB 0 CT1 36 (SUTURE) ×4 IMPLANT
SUT VIC AB 2-0 CT1 27 (SUTURE) ×2
SUT VIC AB 2-0 CT1 TAPERPNT 27 (SUTURE) ×1 IMPLANT
SYR CONTROL 10ML LL (SYRINGE) ×2 IMPLANT
TOWEL OR 17X24 6PK STRL BLUE (TOWEL DISPOSABLE) ×2 IMPLANT
TRAY FOLEY CATH 14FR (SET/KITS/TRAYS/PACK) ×1 IMPLANT
WATER STERILE IRR 1000ML POUR (IV SOLUTION) ×2 IMPLANT

## 2013-11-01 NOTE — Anesthesia Preprocedure Evaluation (Signed)
Anesthesia Evaluation  Patient identified by MRN, date of birth, ID band Patient awake    Reviewed: Allergy & Precautions, H&P , NPO status , Patient's Chart, lab work & pertinent test results  Airway Mallampati: II TM Distance: >3 FB Neck ROM: full    Dental no notable dental hx.    Pulmonary    Pulmonary exam normal       Cardiovascular negative cardio ROS      Neuro/Psych negative neurological ROS  negative psych ROS   GI/Hepatic negative GI ROS, Neg liver ROS,   Endo/Other  Morbid obesity  Renal/GU negative Renal ROS     Musculoskeletal   Abdominal (+) + obese,   Peds  Hematology negative hematology ROS (+)   Anesthesia Other Findings   Reproductive/Obstetrics (+) Pregnancy                           Anesthesia Physical Anesthesia Plan  ASA: III  Anesthesia Plan: Combined Spinal and Epidural   Post-op Pain Management:    Induction:   Airway Management Planned:   Additional Equipment:   Intra-op Plan:   Post-operative Plan:   Informed Consent: I have reviewed the patients History and Physical, chart, labs and discussed the procedure including the risks, benefits and alternatives for the proposed anesthesia with the patient or authorized representative who has indicated his/her understanding and acceptance.     Plan Discussed with:   Anesthesia Plan Comments:         Anesthesia Quick Evaluation

## 2013-11-01 NOTE — Anesthesia Procedure Notes (Signed)
Spinal  Patient location during procedure: OR Start time: 11/01/2013 9:21 AM End time: 11/01/2013 9:25 AM Reason for block: procedure for pain Staffing Anesthesiologist: Leilani Able Performed by: anesthesiologist  Preanesthetic Checklist Completed: patient identified, surgical consent, pre-op evaluation, timeout performed, IV checked, risks and benefits discussed and monitors and equipment checked Spinal Block Patient position: sitting Prep: DuraPrep Patient monitoring: cardiac monitor, continuous pulse ox, blood pressure and heart rate Approach: midline Location: L3-4 Injection technique: catheter Needle Needle type: Tuohy and Sprotte  Needle gauge: 24 G Needle length: 12.7 cm Needle insertion depth: 9 cm Catheter type: closed end flexible Catheter size: 19 g Catheter at skin depth: 15 cm Assessment Sensory level: T8

## 2013-11-01 NOTE — Anesthesia Postprocedure Evaluation (Signed)
Anesthesia Post Note  Patient: Amber Daniels  Procedure(s) Performed: Procedure(s) (LRB): REPEAT CESAREAN SECTION (N/A)  Anesthesia type: Spinal  Patient location: PACU  Post pain: Pain level controlled  Post assessment: Post-op Vital signs reviewed  Last Vitals:  Filed Vitals:   11/01/13 1130  BP: 106/85  Pulse: 92  Temp:   Resp: 24    Post vital signs: Reviewed  Level of consciousness: awake  Complications: No apparent anesthesia complications

## 2013-11-01 NOTE — Brief Op Note (Signed)
11/01/2013  11:08 AM  PATIENT:  Amber Daniels  32 y.o. female  PRE-OPERATIVE DIAGNOSIS:  Previous Cesarean Section, Morbid Obesity, Term gestation  POST-OPERATIVE DIAGNOSIS:  Previous Cesarean Section, Morbid Obesity , Term gestation, abdominopelvic adhesions  PROCEDURE:  Repeat Cesarean section, Kerr hysterotomy, extensive lysis of adhesions  SURGEON:  Surgeon(s) and Role:    * Kania Regnier Cathie Beams, MD - Primary  PHYSICIAN ASSISTANT:   ASSISTANTS: Raelyn Mora, CNM   ANESTHESIA:   epidural and spinal Findings: extensive omental adhesion to ant abdominal wall, & uterus, live female floating vtx, nl tubes and ovaries  EBL:  Total I/O In: 3500 [I.V.:3500] Out: 1000 [Urine:200; Blood:800]  BLOOD ADMINISTERED:none  DRAINS: none   LOCAL MEDICATIONS USED:  MARCAINE     SPECIMEN:  Source of Specimen:  Placenta  not sent  DISPOSITION OF SPECIMEN:  N/A  COUNTS:  YES  TOURNIQUET:  * No tourniquets in log *  DICTATION: .Other Dictation: Dictation Number [id number]  PLAN OF CARE: Admit to inpatient   PATIENT DISPOSITION:  PACU - hemodynamically stable.   Delay start of Pharmacological VTE agent (>24hrs) due to surgical blood loss or risk of bleeding: no

## 2013-11-01 NOTE — Transfer of Care (Signed)
Immediate Anesthesia Transfer of Care Note  Patient: Amber Daniels  Procedure(s) Performed: Procedure(s) with comments: REPEAT CESAREAN SECTION (N/A) - EDD: 11/04/13  Patient Location: PACU  Anesthesia Type:Epidural  Level of Consciousness: awake, alert  and oriented  Airway & Oxygen Therapy: Patient Spontanous Breathing  Post-op Assessment: Report given to PACU RN  Post vital signs: Reviewed  Complications: No apparent anesthesia complications

## 2013-11-01 NOTE — Anesthesia Postprocedure Evaluation (Signed)
Anesthesia Post Note  Patient: Amber Daniels  Procedure(s) Performed: Procedure(s) (LRB): REPEAT CESAREAN SECTION (N/A)  Anesthesia type: CSE  Patient location: Mother/Baby  Post pain: Pain level controlled  Post assessment: Post-op Vital signs reviewed  Last Vitals:  Filed Vitals:   11/01/13 1750  BP: 117/75  Pulse: 79  Temp: 36.3 C  Resp: 16    Post vital signs: Reviewed  Level of consciousness: awake  Complications: No apparent anesthesia complications

## 2013-11-01 NOTE — Preoperative (Signed)
Beta Blockers   Reason not to administer Beta Blockers:Not Applicable 

## 2013-11-01 NOTE — Progress Notes (Signed)
Dr. Jean Rosenthal called about pt pain, he ordered pt to go ahead and receive 2 percocet for pain and to start prior to 12 hrs after duramorph since she is in such pain. Pt is tolerating diet, without nausea.

## 2013-11-01 NOTE — Progress Notes (Signed)
Patient was referred for history of depression/anxiety. * Referral screened out by Clinical Social Worker because none of the following criteria appear to apply: ~ History of anxiety/depression during this pregnancy, or of post-partum depression. ~ Diagnosis of anxiety and/or depression within last 3 years-PNR states patient stopped taking medication in 2007. ~ History of depression due to pregnancy loss/loss of child OR * Patient's symptoms currently being treated with medication and/or therapy. Please contact the Clinical Social Worker if needs arise, or if patient requests.

## 2013-11-02 ENCOUNTER — Encounter (HOSPITAL_COMMUNITY): Payer: Self-pay | Admitting: Obstetrics and Gynecology

## 2013-11-02 LAB — CBC
HCT: 29.3 % — ABNORMAL LOW (ref 36.0–46.0)
Hemoglobin: 9.5 g/dL — ABNORMAL LOW (ref 12.0–15.0)
MCH: 28 pg (ref 26.0–34.0)
MCHC: 32.4 g/dL (ref 30.0–36.0)
MCV: 86.4 fL (ref 78.0–100.0)
Platelets: 167 10*3/uL (ref 150–400)
RBC: 3.39 MIL/uL — ABNORMAL LOW (ref 3.87–5.11)
RDW: 17.1 % — ABNORMAL HIGH (ref 11.5–15.5)
WBC: 10.3 10*3/uL (ref 4.0–10.5)

## 2013-11-02 LAB — CCBB MATERNAL DONOR DRAW

## 2013-11-02 NOTE — Progress Notes (Signed)
POD # 1  Subjective: Pt reports feeling good/ Pain controlled with Motrin and Percocet Tolerating po/ Foley d/c'd and voiding without problems/ No n/v/ Flatus absent but belching Activity: ad lib Bleeding is light Newborn info:  Information for the patient's newborn:  Deborah, Lazcano [[id number]]  female Feeding: breast   Objective:  VS:  Filed Vitals:   11/01/13 2020 11/01/13 2242 11/02/13 0100 11/02/13 0515  BP: 138/84 136/83 145/73 127/80  Pulse: 101 85 106 92  Temp: 98 F (36.7 C) 98.2 F (36.8 C) 98.5 F (36.9 C) 97.1 F (36.2 C)  TempSrc: Oral Oral Oral Oral  Resp: 16 20 20 20   Weight:      SpO2:  100% 100% 100%     I&O: Intake/Output     12/02 0701 - 12/03 0700 12/03 0701 - 12/04 0700   P.O. 1800    I.V. (mL/kg) 4600 (28)    Total Intake(mL/kg) 6400 (38.9)    Urine (mL/kg/hr) 1625    Blood 800    Total Output 2425     Net +3975             Recent Labs  10/31/13 1135 11/02/13 0600  WBC 9.4 10.3  HGB 11.6* 9.5*  HCT 36.0 29.3*  PLT 200 167    Blood type: --/--/O POS, O POS (12/01 1135) Rubella: Immune (04/28 1125)    Physical Exam:  General: alert, cooperative and morbidly obese CV: Regular rate and rhythm Resp: CTA bilaterally Abdomen: soft, nontender, normal bowel sounds Incision: healing well, no erythema, no hernia, no seroma, no swelling, well approximated, small amt pink drainage along inferior border of honeycomb dressing Uterine Fundus: firm, below umbilicus, nontender Lochia: minimal Ext: edema trace to feet and Homans sign is negative, no sign of DVT    Assessment: POD # 1/ G2P2002/ S/P C/Section d/t repeat Doing well  Plan: Ambulate Continue routine post op orders   Signed: Donette Larry, Dorris Carnes, MSN, CNM 11/02/2013, 8:36 AM

## 2013-11-02 NOTE — Op Note (Signed)
NAMESANTRESA, LEVETT            ACCOUNT NO.:  [id number]  MEDICAL RECORD NO.:  [id number]  LOCATION:  9147                          FACILITY:  [facility]  PHYSICIAN:  Maxie Better, M.D.DATE OF BIRTH:  November 01, 1981  DATE OF PROCEDURE:  11/01/2013 DATE OF DISCHARGE:                              OPERATIVE REPORT   PREOPERATIVE DIAGNOSES:  Morbid obesity, term gestation, previous cesarean section.  PROCEDURES:  Repeat cesarean section, Kerr hysterotomy. Lysis of adhesions.  POSTOPERATIVE DIAGNOSES:  Morbid obesity, term gestation, previous cesarean section.  ANESTHESIA:  Spinal/epidural anesthesia.  SURGEON:  Maxie Better, MD.  ASSISTANT:  Raelyn Mora, CNM.  DESCRIPTION OF PROCEDURE:  Under adequate spinal/epidural anesthesia, the patient was placed in the supine position with a left lateral tilt. She was sterilely prepped and draped in usual fashion.  Indwelling Foley catheter was sterilely placed.  An 0.25% Marcaine was injected along the previous Pfannenstiel skin incision.  Pfannenstiel skin incision was then made, carried down to the rectus fascia.  Rectus fascia was opened transversely.  Rectus fascia was then bluntly and sharply dissected off the rectus muscle in superior and inferior fashion.  The rectus muscle was split in midline.  The parietal peritoneum was entered carefully and sharply.  Upon entering the abdominal cavity, omental adhesions were encountered which needed to be lysed in order to facilitate the surgery. The vesicouterine peritoneum was subsequently opened transversely and the bladder carefully dissected off the lower uterine segment.  A curvilinear low transverse uterine incision was then made and extended with bandage scissors.  Artificial rupture of membranes occurred.  Clear amniotic fluid noted.  Subsequent delivery of a live female was accomplished.  Baby was bulb suctioned on the abdomen and cord was clamped, cut.  The baby was  transferred to the awaiting pediatrician who assigned Apgars of 8 and 9 at 1 and 5 minutes.  The placenta was manually removed.  Uterine cavity was cleaned of debris.  Uterine incision had no extension, was closed in 2 layers, the first layer with 0 Monocryl running locked stitch, second layer was imbricated using 0 Monocryl suture with good hemostasis subsequently noted.  Abdomen was copiously irrigated and suctioned.  The paracolic gutters were cleaned of debris.  Interceed was placed overlying the lower uterine segment. The parietal peritoneum was then closed with 2-0 Vicryl.  The rectus fascia was closed with 0 Vicryl x2.  Subcutaneous areas were irrigated, small bleeders cauterized.  Interrupted 2-0 plain sutures placed, and the skin approximated using Ethicon staples.  SPECIMENS:  Placenta not sent to Pathology.  Cord blood was obtained for donation.  ESTIMATED BLOOD LOSS:  800 mL.  INTRAOPERATIVE FLUIDS:  3400 mL.  URINE OUTPUT:  200 mL.  Sponge and instrument counts x2 were correct.  COMPLICATIONS:  None.  The patient tolerated the procedure well, was transferred to recovery room in stable condition and baby placed skin to skin.     Maxie Better, M.D.     /MEDQ  D:  11/01/2013  T:  11/02/2013  Job:  [id number]

## 2013-11-02 NOTE — Lactation Note (Signed)
This note was copied from the chart of Amber Daniels. Lactation Consultation Note  Patient Name: Amber Eshani Maestre UUVOZ'D Date: 11/02/2013 Reason for consult: Follow-up assessment;Other (Comment) (sleepy baby) but has been latching better today for 10-15 minute feedings.  LC arrived to observe last 5 minutes of a 15 minute feeding on mom's (R) breast.  Baby has rhythmical sucking and a few swallows but after 5 more minutes, she slips off and is no longer rooting. Mom has intact, everted nipple and no nipple discomfort with feedings on either breast.  A few drops of colostrum expressible but mom says she can express more easily on (L) breast.  LC reviewed cue feedings and need to ensure baby feeds at least 8-12 times per 24 hours.  LC also reviewed normal newborn output (p. 24 in Baby and Me).   Maternal Data    Feeding Feeding Type: Breast Fed  LATCH Score/Interventions            Observed baby well-latched on (R) with wide areolar grasp and strong sucking bursts          Lactation Tools Discussed/Used   Cue feedings Normal newborn output, based on baby's day of life  Consult Status Consult Status: Follow-up Date: 11/03/13 Follow-up type: In-patient    Warrick Parisian [facility] 11/02/2013, 8:37 PM

## 2013-11-03 NOTE — Progress Notes (Signed)
Patient ID: Amber Daniels, female   DOB: [birth year], 32 y.o.   MRN: [id number] Subjective: POD# 2 Information for the patient's newborn:  Amber, Daniels [[id number]]  female   Reports feeling well Feeding: breast Patient reports tolerating PO.  Breast symptoms: none Pain controlled with ibuprofen (OTC) and narcotic analgesics including Percocet Denies HA/SOB/C/P/N/V/dizziness. Flatus present, BM x 2 today. She reports vaginal bleeding as normal, without clots.  She is ambulating, urinating without difficult.     Objective:   VS:  Filed Vitals:   11/02/13 0100 11/02/13 0515 11/02/13 1722 11/03/13 0622  BP: 145/73 127/80 134/81 128/78  Pulse: 106 92 107 93  Temp: 98.5 F (36.9 C) 97.1 F (36.2 C) 98.3 F (36.8 C) 98.5 F (36.9 C)  TempSrc: Oral Oral Oral Oral  Resp: 20 20 19 20   Height:    5\' 3"  (1.6 m)  Weight:    164.202 kg (362 lb)  SpO2: 100% 100%      No intake or output data in the 24 hours ending 11/03/13 1428      Recent Labs  11/02/13 0600  WBC 10.3  HGB 9.5*  HCT 29.3*  PLT 167     Blood type: --/--/O POS, O POS (12/01 1135)  Rubella: Immune (04/28 1125)     Physical Exam:  General: alert, cooperative, no distress and morbidly obese Abdomen: soft, nontender, normal bowel sounds Incision: Tegaderm and Honeycomb intact - difficult to assess underlying incision d/t pannus Uterine Fundus: firm, 2 FB below umbilicus, nontender Lochia: minimal Ext: edema 2+, Homans sign is negative, no sign of DVT, redness or tenderness in the calves or thighs      Assessment/Plan: 32 y.o.   POD# 2.  s/p Cesarean Delivery.  Indications: repeat                Principal Problem:   Postpartum care following cesarean delivery (12/2)  Doing well, stable.               Regular diet as tolerated Ambulate Routine post-op care Anticipate discharge tomorrow  Raelyn Mora, M, MSN, CNM 11/03/2013, 2:28 PM

## 2013-11-04 MED ORDER — OXYCODONE-ACETAMINOPHEN 5-325 MG PO TABS: 1.0000 | ORAL_TABLET | ORAL | Status: DC | PRN

## 2013-11-04 MED ORDER — IBUPROFEN 600 MG PO TABS: 600.0000 mg | ORAL_TABLET | Freq: Four times a day (QID) | ORAL | Status: DC | PRN

## 2013-11-04 MED ORDER — FERROUS SULFATE 325 (65 FE) MG PO TABS: 325.0000 mg | ORAL_TABLET | Freq: Two times a day (BID) | ORAL | Status: DC

## 2013-11-04 MED ORDER — SENNOSIDES-DOCUSATE SODIUM 8.6-50 MG PO TABS: 2.0000 | ORAL_TABLET | ORAL | Status: DC

## 2013-11-04 NOTE — Lactation Note (Signed)
This note was copied from the chart of Amber Alyene Predmore. Lactation Consultation Note  Baby sleeping.  Baby recently fed infant for 20 minutes at 1000.  Reviewed breastfeeding basics with mom, engorgement care.  Taught mom to hand express, good amount of colostrum expressed.  Encouraged mother to feed baby 8-12 times in 24 hours and keeping baby awake to feed at least 15-20 min.  Mom using lanolin for dryness. Due to history of yeast infection, advised using olive oil and expressed milk on nipple instead.  Advised about patient services and support group.  Peds will follow up tomorrow for weight check.  Advised to call with next feeding for lactation to view latch.    Patient Name: Amber Daniels BJYNW'G Date: 11/04/2013 Reason for consult: Follow-up assessment   Maternal Data    Feeding Feeding Type: Breast Fed Length of feed: 10 min  LATCH Score/Interventions       Type of Nipple: Everted at rest and after stimulation  Comfort (Breast/Nipple): Soft / non-tender     Intervention(s): Breastfeeding basics reviewed;Skin to skin     Lactation Tools Discussed/Used     Consult Status      Hardie Pulley 11/04/2013, 10:45 AM

## 2013-11-04 NOTE — Progress Notes (Signed)
Patient ID: Amber Daniels, female   DOB: 11-18-1981, 32 y.o.   MRN: [id number] POD # 3  Subjective: Pt reports feeling well and eager for d/c home/ Pain controlled with ibuprofen and percocet Tolerating po/Voiding without problems/ No n/v/Flatus pos Activity: out of bed and ambulate Bleeding is light Newborn info:  Information for the patient's newborn:  Destyne, Goodreau [[id number]]  female Feeding: breast   Objective: VS: Blood pressure 134/84, pulse 96, temperature 98.2 F (36.8 C), temperature source Oral, resp. rate 20.   LABS:  Recent Labs  11/02/13 0600  WBC 10.3  HGB 9.5*  PLT 167                             Physical Exam:  General: alert, cooperative and no distress CV: Regular rate and rhythm Resp: clear Abdomen: pendulous, morbidly obese.  Pos BS x 4 quads  Incision: Tegaderm and honeycomb falling off and removed.  Staples intact and well approximated. Uterine Fundus: firm, below umbilicus, nontender Lochia: minimal Ext: edema +1 and Homans sign is negative, no sign of DVT    A/P: POD # 3/ G2P2002/ S/P Elective repeat C/Section @ 38w 5d Doing well and stable for discharge home RX's: Ibuprofen 600mg  po Q 6 hrs prn pain #30 Refill x 1 Percocet 5/325 1 - 2 tabs po every 6 hrs prn pain  #30 No refill Niferex 150mg  po QD #30 Refill x 1 Colace 100mg  po up to BID prn #30 Ref x 1 Staple removal in the office 11/08/13 @ 3:15 Rt pp visit in 6 weeks   Signed: Demetrius Revel, MSN, [facility] 11/04/2013, 9:46 AM

## 2013-11-04 NOTE — Discharge Summary (Signed)
Obstetric Discharge Summary Reason for Admission: G2 P1 0 0 1 @ 38w 5d for elective repeat C/S.  Morbidly obese Prenatal Procedures: NST and ultrasound Intrapartum Procedures: cesarean: low cervical, transverse and LOA Postpartum Procedures: none Complications-Operative and Postpartum: none Hemoglobin  Date Value Range Status  11/02/2013 9.5* 12.0 - 15.0 g/dL Final     REPEATED TO VERIFY     DELTA CHECK NOTED     HCT  Date Value Range Status  11/02/2013 29.3* 36.0 - 46.0 % Final    Physical Exam:  General: alert, cooperative and no distress Lochia: appropriate Uterine Fundus: firm Incision: closed with staples and well approximated DVT Evaluation: No evidence of DVT seen on physical exam. Negative Homan's sign.  Discharge Diagnoses: G2 P2 s/p repeat C/S with LOA @ 38w 5d.  Morbid obesity.  Mild ABL anemia. Abdominopelvic adhesions  Discharge Information: Date: 11/04/2013 Activity: pelvic rest Diet: routine Medications: PNV, Ibuprofen, Colace, Iron and Percocet Condition: stable Instructions: refer to practice specific booklet Discharge to: home Follow-up Information   Follow up with Emalee Knies A, MD In 6 weeks. (Staple removal in the office 11/08/13 @ 315pm)    Specialty:  Obstetrics and Gynecology   Contact information:   [address] [place] [address] [phone]       Newborn Data: Live born female on 11/01/13 Jonita Albee) Birth Weight: 7 lb 7.6 oz (3391 g) APGAR: 8, 9  Home with mother.  FISHER,JULIE K 11/04/2013, 10:12 AM

## 2014-01-01 ENCOUNTER — Emergency Department (HOSPITAL_BASED_OUTPATIENT_CLINIC_OR_DEPARTMENT_OTHER)
Admission: EM | Admit: 2014-01-01 | Discharge: 2014-01-01 | Disposition: A | Discharge status: Home / Self Care | Payer: 59 | Attending: Emergency Medicine | Admitting: Emergency Medicine

## 2014-01-01 ENCOUNTER — Encounter (HOSPITAL_BASED_OUTPATIENT_CLINIC_OR_DEPARTMENT_OTHER): Payer: Self-pay | Admitting: Emergency Medicine

## 2014-01-01 DIAGNOSIS — J45909 Unspecified asthma, uncomplicated: Secondary | ICD-10-CM | POA: Insufficient documentation

## 2014-01-01 DIAGNOSIS — Z3202 Encounter for pregnancy test, result negative: Secondary | ICD-10-CM | POA: Insufficient documentation

## 2014-01-01 DIAGNOSIS — IMO0002 Reserved for concepts with insufficient information to code with codable children: Secondary | ICD-10-CM | POA: Insufficient documentation

## 2014-01-01 DIAGNOSIS — Z79899 Other long term (current) drug therapy: Secondary | ICD-10-CM | POA: Insufficient documentation

## 2014-01-01 DIAGNOSIS — I10 Essential (primary) hypertension: Secondary | ICD-10-CM | POA: Insufficient documentation

## 2014-01-01 DIAGNOSIS — R609 Edema, unspecified: Secondary | ICD-10-CM

## 2014-01-01 HISTORY — DX: Morbid (severe) obesity due to excess calories: E66.01

## 2014-01-01 LAB — POCT I-STAT, CHEM 8
BUN: 6 mg/dL (ref 6–23)
Calcium, Ion: 1.23 mmol/L (ref 1.12–1.23)
Chloride: 103 mEq/L (ref 96–112)
Creatinine, Ser: 0.7 mg/dL (ref 0.50–1.10)
GLUCOSE: 98 mg/dL (ref 70–99)
HCT: 35 % — ABNORMAL LOW (ref 36.0–46.0)
HEMOGLOBIN: 11.9 g/dL — AB (ref 12.0–15.0)
Potassium: 4.1 mEq/L (ref 3.7–5.3)
SODIUM: 141 meq/L (ref 137–147)
TCO2: 26 mmol/L (ref 0–100)

## 2014-01-01 LAB — URINALYSIS, ROUTINE W REFLEX MICROSCOPIC
Bilirubin Urine: NEGATIVE
Glucose, UA: NEGATIVE mg/dL
Hgb urine dipstick: NEGATIVE
KETONES UR: NEGATIVE mg/dL
LEUKOCYTES UA: NEGATIVE
NITRITE: NEGATIVE
Protein, ur: NEGATIVE mg/dL
SPECIFIC GRAVITY, URINE: 1.017 (ref 1.005–1.030)
Urobilinogen, UA: 0.2 mg/dL (ref 0.0–1.0)
pH: 7 (ref 5.0–8.0)

## 2014-01-01 LAB — CBC WITH DIFFERENTIAL/PLATELET
BASOS ABS: 0 10*3/uL (ref 0.0–0.1)
Basophils Relative: 0 % (ref 0–1)
Eosinophils Absolute: 0.1 10*3/uL (ref 0.0–0.7)
Eosinophils Relative: 2 % (ref 0–5)
HCT: 34.8 % — ABNORMAL LOW (ref 36.0–46.0)
Hemoglobin: 10.8 g/dL — ABNORMAL LOW (ref 12.0–15.0)
LYMPHS PCT: 22 % (ref 12–46)
Lymphs Abs: 1.2 10*3/uL (ref 0.7–4.0)
MCH: 26.7 pg (ref 26.0–34.0)
MCHC: 31 g/dL (ref 30.0–36.0)
MCV: 86.1 fL (ref 78.0–100.0)
Monocytes Absolute: 0.5 10*3/uL (ref 0.1–1.0)
Monocytes Relative: 8 % (ref 3–12)
Neutro Abs: 3.8 10*3/uL (ref 1.7–7.7)
Neutrophils Relative %: 68 % (ref 43–77)
PLATELETS: 219 10*3/uL (ref 150–400)
RBC: 4.04 MIL/uL (ref 3.87–5.11)
RDW: 15.5 % (ref 11.5–15.5)
WBC: 5.5 10*3/uL (ref 4.0–10.5)

## 2014-01-01 LAB — D-DIMER, QUANTITATIVE (NOT AT ARMC): D-Dimer, Quant: 0.4 ug/mL-FEU (ref 0.00–0.48)

## 2014-01-01 LAB — PREGNANCY, URINE: Preg Test, Ur: NEGATIVE

## 2014-01-01 NOTE — ED Notes (Addendum)
Leg and feet swelling x2 days.  Also sore throat. Pt is 8 wks post partum.

## 2014-01-01 NOTE — Discharge Instructions (Signed)
Peripheral Edema You have swelling in your legs (peripheral edema). This swelling is due to excess accumulation of salt and water in your body. Edema may be a sign of heart, kidney or liver disease, or a side effect of a medication. It may also be due to problems in the leg veins. Elevating your legs and using special support stockings may be very helpful, if the cause of the swelling is due to poor venous circulation. Avoid long periods of standing, whatever the cause. Treatment of edema depends on identifying the cause. Chips, pretzels, pickles and other salty foods should be avoided. Restricting salt in your diet is almost always needed. Water pills (diuretics) are often used to remove the excess salt and water from your body via urine. These medicines prevent the kidney from reabsorbing sodium. This increases urine flow. Diuretic treatment may also result in lowering of potassium levels in your body. Potassium supplements may be needed if you have to use diuretics daily. Daily weights can help you keep track of your progress in clearing your edema. You should call your caregiver for follow up care as recommended. SEEK IMMEDIATE MEDICAL CARE IF:   You have increased swelling, pain, redness, or heat in your legs. Edema Edema is a buildup of fluids. It is most common in the feet, ankles, and legs. This happens more as a person ages. It may affect one or both legs. HOME CARE  Raise (elevate) the legs or ankles above the level of the heart while lying down. Avoid sitting or standing still for a long time. Exercise the legs to help the puffiness (swelling) go down. A low-salt diet may help lessen the puffiness. Only take medicine as told by your doctor. GET HELP RIGHT AWAY IF:  You develop shortness of breath or chest pain. You cannot breathe when you lie down. You have more puffiness that does not go away with treatment. You develop pain or redness in the areas that are puffy. You have a  temperature by mouth above 102 F (38.9 C), not controlled by medicine. You gain 03 lb/1.4 kg or more in 1 day or 05 lb/2.3 kg in a week. MAKE SURE YOU:  Understand these instructions. Will watch your condition. Will get help right away if you are not doing well or get worse. Document Released: 05/05/2008 Document Revised: 02/09/2012 Document Reviewed: 05/05/2008 [facility] Patient Information 2014 [employer], [place].   You develop shortness of breath, especially when lying down.  You develop chest or abdominal pain, weakness, or fainting.  You have a fever. Document Released: 12/25/2004 Document Revised: 02/09/2012 Document Reviewed: 12/05/2009 [facility] Patient Information 2014 [place], [place].

## 2014-01-01 NOTE — ED Provider Notes (Signed)
CSN: [id number]     Arrival date & time 01/01/14  4098 History   First MD Initiated Contact with Patient 01/01/14 0831     Chief Complaint  Patient presents with  . Leg Swelling   (Consider location/radiation/quality/duration/timing/severity/associated sxs/prior Treatment) HPI 33 year old female status post C-section 8 weeks prior to evaluation who presents today complaining of bilateral peripheral edema. She states she had severe edema immediately after the baby was born. She states this has been improving. She began taking her blood pressure medicine again on Wednesday and noticed some increased swelling of both of her legs on Friday which is somewhat worse today. She feels that the left leg may be slightly more swollen than the right leg. It is painful but there has not been any redness, chest pain, history of DVT, or dyspnea. She is back to work and is up and down during the day. She is not a smoker. This was her second pregnancy. Past Medical History  Diagnosis Date  . Asthma   . Postpartum care following cesarean delivery (12/2) 11/01/2013  . Morbid obesity    Past Surgical History  Procedure Laterality Date  . Cesarean section    . Cesarean section N/A 11/01/2013    Procedure: REPEAT CESAREAN SECTION;  Surgeon: Serita Kyle, MD;  Location: [facility];  Service: Obstetrics;  Laterality: N/A;  EDD: 11/04/13   No family history on file. History  Substance Use Topics  . Smoking status: Never Smoker   . Smokeless tobacco: Not on file  . Alcohol Use: Yes     Comment: 0 while pregnant   OB History   Grav Para Term Preterm Abortions TAB SAB Ect Mult Living   2 2 2       2      Review of Systems  All other systems reviewed and are negative.    Allergies  Review of patient's allergies indicates no known allergies.  Home Medications   Current Outpatient Rx  Name  Route  Sig  Dispense  Refill  . labetalol (NORMODYNE) 100 MG tablet   Oral   Take 100 mg by mouth 2 (two) times  daily.         Marland Kitchen acetaminophen (TYLENOL) 500 MG tablet   Oral   Take 1,500 mg by mouth every 6 (six) hours as needed. For pain.         Marland Kitchen albuterol (PROVENTIL HFA;VENTOLIN HFA) 108 (90 BASE) MCG/ACT inhaler   Inhalation   Inhale 2 puffs into the lungs every 4 (four) hours as needed for wheezing or shortness of breath.         . beclomethasone (QVAR) 40 MCG/ACT inhaler   Inhalation   Inhale 2 puffs into the lungs daily.         . Doxylamine-Pyridoxine (DICLEGIS) 10-10 MG TBEC   Oral   Take 1 tablet by mouth daily.         . ferrous sulfate 325 (65 FE) MG tablet   Oral   Take 1 tablet (325 mg total) by mouth 2 (two) times daily with a meal.   60 tablet   1   . fexofenadine (ALLEGRA) 60 MG tablet   Oral   Take 60 mg by mouth daily.         . hydrocortisone cream 0.5 %   Topical   Apply 1 application topically 2 (two) times daily. For eczema         . ibuprofen (ADVIL,MOTRIN) 600 MG tablet   Oral  Take 1 tablet (600 mg total) by mouth every 6 (six) hours as needed for mild pain.   30 tablet   1   . magnesium gluconate (MAGONATE) 500 MG tablet   Oral   Take 250 mg by mouth daily.         . mometasone (NASONEX) 50 MCG/ACT nasal spray   Nasal   Place 2 sprays into the nose daily.         Marland Kitchen. oxyCODONE-acetaminophen (PERCOCET/ROXICET) 5-325 MG per tablet   Oral   Take 1-2 tablets by mouth every 4 (four) hours as needed for severe pain (moderate - severe pain).   30 tablet   0   . Prenatal Vit-Fe Fumarate-FA (PRENATAL MULTIVITAMIN) TABS tablet   Oral   Take 1 tablet by mouth daily at 12 noon.         . senna-docusate (SENOKOT-S) 8.6-50 MG per tablet   Oral   Take 2 tablets by mouth daily.   60 tablet   1   . valACYclovir (VALTREX) 500 MG tablet   Oral   Take 500 mg by mouth. For fever blister          BP 129/63  Pulse 87  Temp(Src) 98.6 F (37 C) (Oral)  Resp 16  Ht 5\' 3"  (1.6 m)  Wt 345 lb (156.491 kg)  BMI 61.13 kg/m2  SpO2 99%   LMP 12/23/2013  Breastfeeding? No Physical Exam  Nursing note and vitals reviewed. Constitutional: She is oriented to person, place, and time. She appears well-developed and well-nourished.  Morbid obesity  HENT:  Head: Normocephalic and atraumatic.  Right Ear: External ear normal.  Left Ear: External ear normal.  Nose: Nose normal.  Mouth/Throat: Oropharynx is clear and moist.  Eyes: Conjunctivae and EOM are normal. Pupils are equal, round, and reactive to light.  Neck: Normal range of motion. Neck supple.  Cardiovascular: Normal rate, regular rhythm, normal heart sounds and intact distal pulses.   Pulmonary/Chest: Effort normal and breath sounds normal.  Abdominal: Soft. Bowel sounds are normal.  Musculoskeletal: She exhibits no tenderness.  Trace edema bilateral lower extremities with legs obese but equal. Dorsal pedalis pulses are 2+ bilaterally. There is no tenderness over the deep venous system bilaterally. No superficial veins are present.  Neurological: She is alert and oriented to person, place, and time. She has normal reflexes.  Skin: Skin is warm and dry.  Psychiatric: She has a normal mood and affect. Her behavior is normal. Judgment and thought content normal.    ED Course  Procedures (including critical care time) Labs Review Labs Reviewed  CBC WITH DIFFERENTIAL - Abnormal; Notable for the following:    Hemoglobin 10.8 (*)    HCT 34.8 (*)    All other components within normal limits  POCT I-STAT, CHEM 8 - Abnormal; Notable for the following:    Hemoglobin 11.9 (*)    HCT 35.0 (*)    All other components within normal limits  D-DIMER, QUANTITATIVE  URINALYSIS, ROUTINE W REFLEX MICROSCOPIC  PREGNANCY, URINE   Imaging Review No results found.  EKG Interpretation   None       MDM  8 week postpartum and her morbidly obese female presents today complaining of peripheral edema. She hypertension during her pregnancy had not been taking her medication for  several days ago. She has had some increased swelling. Wells criteria for DVT is negative placing her in the low risk group for DVT and d-dimer is negative. No signs, symptoms, or  complaints consistent with pulmonary embolism. She has trace edema which she had after her pregnancy. She has normal platelets with mild anemia but is improved from prior. There is no protein in her urine. Her blood pressure today is normal. Patient advised regarding symptomatic treatment of peripheral edema.   Hilario Quarry, MD 01/01/14 [phone]

## 2014-08-21 ENCOUNTER — Ambulatory Visit (INDEPENDENT_AMBULATORY_CARE_PROVIDER_SITE_OTHER): Payer: 59 | Admitting: Family Medicine

## 2014-08-21 ENCOUNTER — Ambulatory Visit (INDEPENDENT_AMBULATORY_CARE_PROVIDER_SITE_OTHER): Payer: 59

## 2014-08-21 VITALS — BP 122/78 | HR 78 | Temp 98.2°F | Resp 14 | Ht 64.5 in | Wt 368.0 lb

## 2014-08-21 DIAGNOSIS — R51 Headache: Secondary | ICD-10-CM

## 2014-08-21 DIAGNOSIS — I10 Essential (primary) hypertension: Secondary | ICD-10-CM

## 2014-08-21 DIAGNOSIS — R002 Palpitations: Secondary | ICD-10-CM

## 2014-08-21 DIAGNOSIS — G4730 Sleep apnea, unspecified: Secondary | ICD-10-CM

## 2014-08-21 DIAGNOSIS — R0789 Other chest pain: Secondary | ICD-10-CM

## 2014-08-21 LAB — COMPREHENSIVE METABOLIC PANEL WITH GFR
Alkaline Phosphatase: 83 U/L (ref 39–117)
BUN: 9 mg/dL (ref 6–23)
Total Bilirubin: 0.4 mg/dL (ref 0.2–1.2)

## 2014-08-21 LAB — POCT CBC
Granulocyte percent: 68 %G (ref 37–80)
HCT, POC: 38.6 % (ref 37.7–47.9)
Hemoglobin: 11.7 g/dL — AB (ref 12.2–16.2)
Lymph, poc: 1.8 (ref 0.6–3.4)
MCH, POC: 24.5 pg — AB (ref 27–31.2)
MCHC: 30.4 g/dL — AB (ref 31.8–35.4)
MCV: 80.8 fL (ref 80–97)
MID (cbc): 0.5 (ref 0–0.9)
MPV: 8.3 fL (ref 0–99.8)
POC Granulocyte: 5 (ref 2–6.9)
POC LYMPH PERCENT: 25.2 %L (ref 10–50)
POC MID %: 6.8 % (ref 0–12)
Platelet Count, POC: 276 10*3/uL (ref 142–424)
RBC: 4.77 M/uL (ref 4.04–5.48)
RDW, POC: 18 %
WBC: 7.3 10*3/uL (ref 4.6–10.2)

## 2014-08-21 LAB — COMPREHENSIVE METABOLIC PANEL
ALT: 21 U/L (ref 0–35)
AST: 14 U/L (ref 0–37)
Albumin: 4.2 g/dL (ref 3.5–5.2)
CO2: 27 mEq/L (ref 19–32)
Calcium: 9.2 mg/dL (ref 8.4–10.5)
Chloride: 105 mEq/L (ref 96–112)
Creat: 0.62 mg/dL (ref 0.50–1.10)
Glucose, Bld: 88 mg/dL (ref 70–99)
Potassium: 4.1 mEq/L (ref 3.5–5.3)
Sodium: 140 mEq/L (ref 135–145)
Total Protein: 7 g/dL (ref 6.0–8.3)

## 2014-08-21 LAB — TROPONIN I: Troponin I: <0.01 ng/mL (ref <0.06)

## 2014-08-21 LAB — GLUCOSE, POCT (MANUAL RESULT ENTRY): POC Glucose: 104 mg/dl — AB (ref 70–99)

## 2014-08-21 NOTE — Progress Notes (Signed)
Chief Complaint:  Chief Complaint  Patient presents with  . Headache  . Chest Pain    HPI: Amber Daniels is a 33 y.o. female who is here for  HA and also HTN Went to dentist for routine cleaning and  BP 144/100, then 141/94 at the office, they used a digital wrist cuff. Denies CP, SOB  She has been having HAs with more frequency, She has been having heart palpitations as well but this is chronic for her , she doe not see anyone for her heart murmur, dx when she was little.  Her HA are usually tension HA and are in the frontal area . She has a history of migraine Everytime she is pregnant she has migraines, sciatica and carpal tunnel.  LMP was yesterday Denies vision changes, clamminess, nausea or vomiting, she feels there is some swelling in her legs but is already on diureticl She was given this by Dr Cherly Hensen office.   Dr Florentina Addison BLand was her PCP but is no longer accepting [employer] for a period of time and is now accepting [facility] again? She is not sure.  She has has 3 epsiodesd of HA in the last week where she had to take excedrin and if she take 2 pills of excedrin it makes her jittery. Last HA was Thursday.   She is supposed to wear glasses but does not wear them. So this maybe a contributing factor.    Past Medical History  Diagnosis Date  . Asthma   . Postpartum care following cesarean delivery (12/2) 11/01/2013  . Morbid obesity   . Allergy   . Heart murmur   . Hypertension    Past Surgical History  Procedure Laterality Date  . Cesarean section    . Cesarean section N/A 11/01/2013    Procedure: REPEAT CESAREAN SECTION;  Surgeon: Serita Kyle, MD;  Location: [facility];  Service: Obstetrics;  Laterality: N/A;  EDD: 11/04/13   History   Social History  . Marital Status: Single    Spouse Name: N/A    Number of Children: N/A  . Years of Education: N/A   Social History Main Topics  . Smoking status: Never Smoker   . Smokeless tobacco:  None  . Alcohol Use: Yes     Comment: 0 while pregnant  . Drug Use: No  . Sexual Activity: Yes    Birth Control/ Protection: IUD   Other Topics Concern  . None   Social History Narrative  . None   Family History  Problem Relation Age of Onset  . Diabetes Mother   . Hypertension Mother   . Diabetes Father   . Hypertension Father   . Hypertension Brother   . Hypertension Maternal Grandmother   . Hypertension Maternal Grandfather    No Known Allergies Prior to Admission medications   Medication Sig Start Date End Date Taking? Authorizing Provider  acetaminophen (TYLENOL) 500 MG tablet Take 1,500 mg by mouth every 6 (six) hours as needed. For pain.   Yes Historical Provider, MD  albuterol (PROVENTIL HFA;VENTOLIN HFA) 108 (90 BASE) MCG/ACT inhaler Inhale 2 puffs into the lungs every 4 (four) hours as needed for wheezing or shortness of breath.   Yes Historical Provider, MD  beclomethasone (QVAR) 40 MCG/ACT inhaler Inhale 2 puffs into the lungs daily.   Yes Historical Provider, MD  chlorthalidone (HYGROTON) 25 MG tablet Take 25 mg by mouth daily.   Yes Historical Provider, MD  fexofenadine (ALLEGRA) 60  MG tablet Take 60 mg by mouth daily.   Yes Historical Provider, MD  hydrocortisone cream 0.5 % Apply 1 application topically 2 (two) times daily. For eczema   Yes Historical Provider, MD  ibuprofen (ADVIL,MOTRIN) 600 MG tablet Take 1 tablet (600 mg total) by mouth every 6 (six) hours as needed for mild pain. 11/04/13  Yes Demetrius Revel, NP  labetalol (NORMODYNE) 100 MG tablet Take 100 mg by mouth 2 (two) times daily.   Yes Historical Provider, MD     ROS: The patient denies fevers, chills, night sweats, unintentional weight loss, chest pain, palpitations, wheezing, dyspnea on exertion, nausea, vomiting, abdominal pain, dysuria, hematuria, melena, + chronic numbness, weakness, or tingling.  All other systems have been reviewed and were otherwise negative with the exception of those  mentioned in the HPI and as above.    PHYSICAL EXAM: Filed Vitals:   08/21/14 1506  BP: 122/78  Pulse: 78  Temp: 98.2 F (36.8 C)  Resp: 14   Filed Vitals:   08/21/14 1506  Height: 5' 4.5" (1.638 m)  Weight: 368 lb (166.924 kg)   Body mass index is 62.21 kg/(m^2).  General: Alert, no acute distress, morbidly obese  AA female HEENT:  Normocephalic, atraumatic, oropharynx patent. EOMI, PERRLA, TM nl, fundo exam nl Cardiovascular:  Regular rate and rhythm, no rubs murmurs or gallops.  No Carotid bruits, radial pulse intact. No pedal edema.  Respiratory: Clear to auscultation bilaterally.  No wheezes, rales, or rhonchi.  No cyanosis, no use of accessory musculature GI: No organomegaly, abdomen is soft and non-tender, positive bowel sounds.  No masses. Skin: No rashes. Neurologic: Facial musculature symmetric. CN 2-12 grossly normal Psychiatric: Patient is appropriate throughout our interaction. Lymphatic: No cervical lymphadenopathy Musculoskeletal: Gait intact.   LABS: Results for orders placed in visit on 08/21/14  GLUCOSE, POCT (MANUAL RESULT ENTRY)      Result Value Ref Range   POC Glucose 104 (*) 70 - 99 mg/dl  POCT CBC      Result Value Ref Range   WBC 7.3  4.6 - 10.2 K/uL   Lymph, poc 1.8  0.6 - 3.4   POC LYMPH PERCENT 25.2  10 - 50 %L   MID (cbc) 0.5  0 - 0.9   POC MID % 6.8  0 - 12 %M   POC Granulocyte 5.0  2 - 6.9   Granulocyte percent 68.0  37 - 80 %G   RBC 4.77  4.04 - 5.48 M/uL   Hemoglobin 11.7 (*) 12.2 - 16.2 g/dL   HCT, POC 04.5  40.9 - 47.9 %   MCV 80.8  80 - 97 fL   MCH, POC 24.5 (*) 27 - 31.2 pg   MCHC 30.4 (*) 31.8 - 35.4 g/dL   RDW, POC 81.1     Platelet Count, POC 276  142 - 424 K/uL   MPV 8.3  0 - 99.8 fL     EKG/XRAY:   Primary read interpreted by Dr. Conley Rolls at [facility]. No acute cardiopulmonary process   ASSESSMENT/PLAN: Encounter Diagnoses  Name Primary?  . Intermittent palpitations Yes  . Headache(784.0)   . Essential hypertension   .  Chest pressure    Refer to cardiology for  history of  PVCs, HTN, prior h.o heart murmur She has sleep apnea but does not want to use CPAP, she has interrupted sleep, she has a baby and 31 old who sleeps with her and she works full time and is a single  mom. Besides OSA noncompliancy , she has sleep deprovation  She states she does not have extra money to get the CPAP machine and will try to borrow her mother's who does not use it at all, if she gets a hand me down CPAP machine then I will need to probably get her another sleep study to get the settings and mask fittings unless she has settings from prior  C/w HTN meds, advise to get CPAP to help with BP control and also HAs, weight loss and exercise encouraged She can cont to take excedrin and tylenol , declines any stronger meds   Gross sideeffects, risk and benefits, and alternatives of medications d/w patient. Patient is aware that all medications have potential sideeffects and we are unable to predict every sideeffect or drug-drug interaction that may occur.  Hamilton Capri PHUONG, DO 08/21/2014 4:29 PM

## 2014-08-23 ENCOUNTER — Telehealth: Payer: Self-pay | Admitting: Family Medicine

## 2014-08-23 NOTE — Telephone Encounter (Signed)
Spoke with patient about labs 

## 2014-09-11 ENCOUNTER — Telehealth: Payer: Self-pay

## 2014-09-11 DIAGNOSIS — I10 Essential (primary) hypertension: Secondary | ICD-10-CM

## 2014-09-11 MED ORDER — LABETALOL HCL 100 MG PO TABS: 100.0000 mg | ORAL_TABLET | Freq: Two times a day (BID) | ORAL | Status: DC

## 2014-09-11 MED ORDER — CHLORTHALIDONE 25 MG PO TABS: 25.0000 mg | ORAL_TABLET | Freq: Every day | ORAL | Status: DC

## 2014-09-11 NOTE — Telephone Encounter (Signed)
Pt called to request a refill on labetalol and chlorthalidon  Please call pt to advise

## 2014-09-11 NOTE — Telephone Encounter (Signed)
Done

## 2014-09-18 ENCOUNTER — Ambulatory Visit: Payer: Self-pay | Admitting: Family Medicine

## 2014-10-20 ENCOUNTER — Encounter: Payer: Self-pay | Admitting: Family Medicine

## 2014-10-20 ENCOUNTER — Ambulatory Visit (INDEPENDENT_AMBULATORY_CARE_PROVIDER_SITE_OTHER): Payer: 59 | Admitting: Family Medicine

## 2014-10-20 VITALS — BP 123/78 | HR 84 | Temp 98.5°F | Resp 18 | Ht 63.5 in | Wt 374.4 lb

## 2014-10-20 DIAGNOSIS — G4730 Sleep apnea, unspecified: Secondary | ICD-10-CM

## 2014-10-20 DIAGNOSIS — I10 Essential (primary) hypertension: Secondary | ICD-10-CM

## 2014-10-20 DIAGNOSIS — L219 Seborrheic dermatitis, unspecified: Secondary | ICD-10-CM

## 2014-10-20 DIAGNOSIS — R635 Abnormal weight gain: Secondary | ICD-10-CM

## 2014-10-20 DIAGNOSIS — M25562 Pain in left knee: Secondary | ICD-10-CM

## 2014-10-20 DIAGNOSIS — Z23 Encounter for immunization: Secondary | ICD-10-CM

## 2014-10-20 MED ORDER — CYCLOBENZAPRINE HCL 5 MG PO TABS: 5.0000 mg | ORAL_TABLET | Freq: Three times a day (TID) | ORAL | Status: DC | PRN

## 2014-10-20 MED ORDER — FLUOCINOLONE ACETONIDE SCALP 0.01 % EX OIL: TOPICAL_OIL | CUTANEOUS | Status: DC

## 2014-10-20 NOTE — Progress Notes (Signed)
Chief Complaint:  Chief Complaint  Patient presents with  . Hypertension  . Palpitations  . Follow-up    cardiologist  . Knee Pain    left knee gave out on her in the shower the other day and now it is giving her trouble    HPI: Amber Daniels is a 33 y.o. female who is here for referral to Bariatric Surgery, she has gone to the orientation and would like to pursue this.  Has tried the following diets in the past without success or had to stop prematurely for different reasons.  1. Belvique- was more tired onmeds. She was only on it for 1 month March 2014 2. Has tried body by [place], meal replacement did this for 4 months, and it failed bc did not teach her how to eat right, Thi s was in Nov 2013 3. Tried HcG shots and the shots were awesome and then got pregnant. She was 330-340 and then got down to 296. [place] [facility] but too far for her to travel back and forth. 4. She was also on it with fentermine.She was on a 1200 calorie diet  This was in 2007 She feesl seh ah  She was seen by Dr Jacinto HalimGanji for her heart and plapitations were noncardiac related per the patient, she also has sleep apnea that was dx with what sunds like a screening pulse ox recorder from her PCPs office, she was never fitted for a CPAP because she states it was too expensive, she has one now that was given to her by her mother who refuses to wear it.   She felt her left knee gave way int he shower 3 days ago and now she is having trouble with the left side of her left knee when she bends it or moves it a certain way. She ahs no pain with ambulation or walking, it is jsut wiith certain movements. Denies weakness/n/t. No prior knee injuries .    BP Readings from Last 3 Encounters:  10/20/14 123/78  08/21/14 122/78  01/01/14 129/63   Wt Readings from Last 3 Encounters:  10/20/14 374 lb 6.4 oz (169.827 kg)  08/21/14 368 lb (166.924 kg)  01/01/14 345 lb (156.491 kg)      Past Medical History  Diagnosis Date  . Asthma   . Postpartum care following cesarean delivery (12/2) 11/01/2013  . Morbid obesity   . Allergy   . Heart murmur   . Hypertension   . Sleep apnea     not on CPAP, dx 01/2013 with Outpatietn overnight study at home.    Past Surgical History  Procedure Laterality Date  . Cesarean section    . Cesarean section N/A 11/01/2013    Procedure: REPEAT CESAREAN SECTION;  Surgeon: Serita KyleSheronette A Cousins, MD;  Location: [facility];  Service: Obstetrics;  Laterality: N/A;  EDD: 11/04/13   History   Social History  . Marital Status: Single    Spouse Name: N/A    Number of Children: N/A  . Years of Education: N/A   Social History Main Topics  . Smoking status: Never Smoker   . Smokeless tobacco: None  . Alcohol Use: Yes     Comment: 0 while pregnant  . Drug Use: No  . Sexual Activity: Yes    Birth Control/ Protection: IUD   Other Topics Concern  . None   Social History Narrative   Family History  Problem Relation Age of  Onset  . Diabetes Mother   . Hypertension Mother   . Diabetes Father   . Hypertension Father   . Hypertension Brother   . Hypertension Maternal Grandmother   . Hypertension Maternal Grandfather    No Known Allergies Prior to Admission medications   Medication Sig Start Date End Date Taking? Authorizing Provider  acetaminophen (TYLENOL) 500 MG tablet Take 1,500 mg by mouth every 6 (six) hours as needed. For pain.   Yes Historical Provider, MD  albuterol (PROVENTIL HFA;VENTOLIN HFA) 108 (90 BASE) MCG/ACT inhaler Inhale 2 puffs into the lungs every 4 (four) hours as needed for wheezing or shortness of breath.   Yes Historical Provider, MD  beclomethasone (QVAR) 40 MCG/ACT inhaler Inhale 2 puffs into the lungs daily.   Yes Historical Provider, MD  chlorthalidone (HYGROTON) 25 MG tablet Take 1 tablet (25 mg total) by mouth daily. 09/11/14  Yes Artis Buechele P Sandia Pfund, DO  fexofenadine (ALLEGRA) 60 MG tablet Take 60 mg by mouth daily.    Yes Historical Provider, MD  hydrocortisone cream 0.5 % Apply 1 application topically 2 (two) times daily. For eczema   Yes Historical Provider, MD  ibuprofen (ADVIL,MOTRIN) 600 MG tablet Take 1 tablet (600 mg total) by mouth every 6 (six) hours as needed for mild pain. 11/04/13  Yes Demetrius Revel, NP  labetalol (NORMODYNE) 100 MG tablet Take 1 tablet (100 mg total) by mouth 2 (two) times daily. 09/11/14  Yes Berma Harts P Patric Vanpelt, DO     ROS: The patient denies fevers, chills, night sweats, unintentional weight loss, chest pain, palpitations, wheezing, dyspnea on exertion, nausea, vomiting, abdominal pain, dysuria, hematuria, melena, numbness, weakness, or tingling.   All other systems have been reviewed and were otherwise negative with the exception of those mentioned in the HPI and as above.    PHYSICAL EXAM: Filed Vitals:   10/20/14 1513  BP: 123/78  Pulse: 84  Temp: 98.5 F (36.9 C)  Resp: 18   Filed Vitals:   10/20/14 1513  Height: 5' 3.5" (1.613 m)  Weight: 374 lb 6.4 oz (169.827 kg)   Body mass index is 65.27 kg/(m^2).  General: Alert, no acute distress, morbidly obese HEENT:  Normocephalic, atraumatic, oropharynx patent. EOMI, PERRLA Cardiovascular:  Regular rate and rhythm, no rubs murmurs or gallops.  No Carotid bruits, radial pulse intact. No pedal edema.  Respiratory: Clear to auscultation bilaterally.  No wheezes, rales, or rhonchi.  No cyanosis, no use of accessory musculature GI: No organomegaly, abdomen is soft and non-tender, positive bowel sounds.  No masses. Skin: No rashes. Neurologic: Facial musculature symmetric. Psychiatric: Patient is appropriate throughout our interaction. Lymphatic: No cervical lymphadenopathy Musculoskeletal: Gait intact. + left lateral knee tenderness at LCL,  Full ROM 5/5 strength, 2/2 DTRs No deformities, stable to varus and valgus stress Neg Lachman, neg McMurray, no jt line tenderness, no effusion    LABS: Results for orders placed  or performed in visit on 08/21/14  Troponin I  Result Value Ref Range   Troponin I <0.01 <0.06 ng/mL  Comprehensive metabolic panel  Result Value Ref Range   Sodium 140 135 - 145 mEq/L   Potassium 4.1 3.5 - 5.3 mEq/L   Chloride 105 96 - 112 mEq/L   CO2 27 19 - 32 mEq/L   Glucose, Bld 88 70 - 99 mg/dL   BUN 9 6 - 23 mg/dL   Creat 0.45 4.09 - 8.11 mg/dL   Total Bilirubin 0.4 0.2 - 1.2 mg/dL   Alkaline Phosphatase 83  39 - 117 U/L   AST 14 0 - 37 U/L   ALT 21 0 - 35 U/L   Total Protein 7.0 6.0 - 8.3 g/dL   Albumin 4.2 3.5 - 5.2 g/dL   Calcium 9.2 8.4 - 82.910.5 mg/dL  POCT glucose (manual entry)  Result Value Ref Range   POC Glucose 104 (A) 70 - 99 mg/dl  POCT CBC  Result Value Ref Range   WBC 7.3 4.6 - 10.2 K/uL   Lymph, poc 1.8 0.6 - 3.4   POC LYMPH PERCENT 25.2 10 - 50 %L   MID (cbc) 0.5 0 - 0.9   POC MID % 6.8 0 - 12 %M   POC Granulocyte 5.0 2 - 6.9   Granulocyte percent 68.0 37 - 80 %G   RBC 4.77 4.04 - 5.48 M/uL   Hemoglobin 11.7 (A) 12.2 - 16.2 g/dL   HCT, POC 56.238.6 13.037.7 - 47.9 %   MCV 80.8 80 - 97 fL   MCH, POC 24.5 (A) 27 - 31.2 pg   MCHC 30.4 (A) 31.8 - 35.4 g/dL   RDW, POC 86.518.0 %   Platelet Count, POC 276 142 - 424 K/uL   MPV 8.3 0 - 99.8 fL     EKG/XRAY:   Primary read interpreted [facility].   ASSESSMENT/PLAN: Encounter Diagnoses  Name Primary?  . Weight gain Yes  . Morbid obesity   . Seborrheic dermatitis   . Essential hypertension   . Left knee pain   . Sleep apnea    Refer to bariatric weightloss clinic Rx fluocinolone oil Refilled flexeril, c/w mobic and tylenol prn . Defer knee xrays for now since pt did not have any injuireis or trauma and she is getting better Refer to [facility] for appropriate testing and fitting prn with Dr Tsosie Billingohemeir  Gross sideeffects, risk and benefits, and alternatives of medications d/w patient. Patient is aware that all medications have potential sideeffects and we are unable to predict every sideeffect or  drug-drug interaction that may occur.  Darcell Sabino PHUONG, DO 10/24/2014 9:42 AM

## 2014-11-16 ENCOUNTER — Other Ambulatory Visit (INDEPENDENT_AMBULATORY_CARE_PROVIDER_SITE_OTHER): Payer: Self-pay | Admitting: Surgery

## 2014-11-17 ENCOUNTER — Encounter: Payer: Self-pay | Admitting: Family Medicine

## 2014-11-17 ENCOUNTER — Ambulatory Visit (INDEPENDENT_AMBULATORY_CARE_PROVIDER_SITE_OTHER): Payer: 59 | Admitting: Family Medicine

## 2014-11-17 DIAGNOSIS — I159 Secondary hypertension, unspecified: Secondary | ICD-10-CM

## 2014-11-17 DIAGNOSIS — I10 Essential (primary) hypertension: Secondary | ICD-10-CM | POA: Insufficient documentation

## 2014-11-17 DIAGNOSIS — K219 Gastro-esophageal reflux disease without esophagitis: Secondary | ICD-10-CM

## 2014-11-17 DIAGNOSIS — R600 Localized edema: Secondary | ICD-10-CM

## 2014-11-17 DIAGNOSIS — M546 Pain in thoracic spine: Secondary | ICD-10-CM

## 2014-11-17 MED ORDER — RANITIDINE HCL 150 MG PO TABS: 150.0000 mg | ORAL_TABLET | Freq: Two times a day (BID) | ORAL | Status: DC | PRN

## 2014-11-17 NOTE — Progress Notes (Signed)
IDENTIFYING INFORMATION  Amber BorsShakeita Daniels / DOB: 07/26/1981 / MRN: [id number]  The patient has Morbid obesity; HTN (hypertension); Esophageal reflux; and Bilateral edema of lower extremity on her problem list.  SUBJECTIVE  CC: Follow-up   HPI: Amber Daniels is a 33 y.o. y.o. female presenting for follow up.  She is scheduled to see Dr. Diamantina Monksoemiere on January 18th, and she will be scheduled for the study after that.  She thinks that she snores, and reports that she wakes up gasping for breath.    She is down 7 lbs and attributes the to being sick with a stomach virus for for three or so days.  She reports drinking water and regular sprite.  She does not eat breakfast.  She may eat a salad from [employer] or [employer] where she fixes her own.  She denies snacking.  She has a pedometer now, and she averages 2.16 miles.  She is also taking the steps at work, and this is two flights.    She went to the bariatric surgeon yesterday and she will need to lose weight for six consecutive months.   She reports belching with certain foods and reports that she will  have heartburn every two days and this is associated with certain foods.  This does not happen at night.  She denies trouble swallowing and she is a non smoker.  She  has a past medical history of Asthma; Postpartum care following cesarean delivery (12/2) (11/01/2013); Morbid obesity; Allergy; Heart murmur; Hypertension; and Sleep apnea.    She has a current medication list which includes the following prescription(s): acetaminophen, albuterol, beclomethasone, chlorthalidone, cyclobenzaprine, fexofenadine, fluocinolone acetonide scalp, ibuprofen, labetalol, and montelukast.  Amber Daniels has No Known Allergies. She  reports that she has never smoked. She does not have any smokeless tobacco history on file. She reports that she drinks about 4.2 oz of alcohol per week. She reports that she does not use illicit drugs. She  reports that she currently  engages in sexual activity. She reports using the following method of birth control/protection: IUD.  The patient  has past surgical history that includes Cesarean section and Cesarean section (N/A, 11/01/2013).  Her family history includes Diabetes in her father and mother; Hypertension in her brother, father, maternal grandfather, maternal grandmother, and mother.  Review of Systems  Constitutional: Negative for fever and chills.  Respiratory: Negative for cough, shortness of breath and wheezing.   Cardiovascular: Negative for chest pain, palpitations and orthopnea.  Gastrointestinal: Negative for heartburn, nausea, vomiting, abdominal pain, diarrhea and constipation.  Genitourinary: Negative for dysuria, urgency and frequency.  Musculoskeletal: Positive for back pain.  Endo/Heme/Allergies: Negative for polydipsia.  Psychiatric/Behavioral: Negative for depression.     OBJECTIVE  Blood pressure 146/92, pulse 100, temperature 98.2 F (36.8 C), temperature source Oral, resp. rate 16, height 5' 3.25" (1.607 m), weight 367 lb 12.8 oz (166.833 kg), last menstrual period 11/11/2014, SpO2 98 %, not currently breastfeeding. The patient's body mass index is 64.6 kg/(m^2).  Physical Exam  Constitutional: She is oriented to person, place, and time. No distress.  She is obese  HENT:  Mouth/Throat: No oropharyngeal exudate.  Eyes: Pupils are equal, round, and reactive to light. No scleral icterus.  Neck: Normal range of motion.  Cardiovascular: Normal rate, regular rhythm and normal heart sounds.   Respiratory: Effort normal and breath sounds normal.  GI: Soft. Bowel sounds are normal.  Musculoskeletal:       Arms: Neurological: She is alert and  oriented to person, place, and time. She has normal reflexes.  Skin: Skin is warm and dry. She is not diaphoretic.  Psychiatric: She has a normal mood and affect.    No results found for this or any previous visit (from the past 24  hour(s)).  ASSESSMENT & PLAN  Amber Daniels was seen today for follow-up.  Diagnoses and associated orders for this visit:  Morbid obesity: Patient planning to proceed with bariatric surgery after 6 months of consecutive weight loss. Forms completed by me and review by Dr. Conley RollsLe.  She is in agreement.  Forms will be scanned into EPIC media tab for access by surgeon's office.   Gastroesophageal reflux disease, esophagitis presence not specified - ranitidine (ZANTAC) 150 MG tablet; Take 1 tablet (150 mg total) by mouth 2 (two) times daily as needed for heartburn.  Secondary hypertension, unspecified: 2/2 morbid obesity.  Patient is controlled medically.  Continue current plan.   Bilateral edema of lower extremity: 2/2 morbid obesity.  Patient is managed with chlorthalidone qd.    Bilateral thoracic back pain: Likely 2/2 first problem.  Managed with ibuprofen 600 mg prn, tylenol, and Flexeril. Continue course.       The patient was instructed to to call or comeback to clinic as needed, or should symptoms warrant.  Deliah BostonMichael Shalawn Wynder, MHS, PA-C [facility] 11/17/2014 4:16 PM

## 2014-11-28 ENCOUNTER — Telehealth: Payer: Self-pay

## 2014-11-28 NOTE — Telephone Encounter (Signed)
Patient left voicemail today at 12:03pm asking if we have received records from Dr. Pearlean BrownieVeta Bland's office. Please return call at [phone].

## 2014-11-29 ENCOUNTER — Institutional Professional Consult (permissible substitution): Payer: 59 | Admitting: Neurology

## 2014-12-04 ENCOUNTER — Telehealth: Payer: Self-pay

## 2014-12-04 NOTE — Telephone Encounter (Signed)
Pt wants to speak with Dr. Conley Rolls regarding pt's weight loss appt. Please return call and advise. Cb # [phone]

## 2014-12-04 NOTE — Telephone Encounter (Signed)
Pt called again to ask if we received records from Dr. Parke Simmers. Returned pt's call and she advised that Dr. Tedra Senegal office has not yet sent the records. Pt states she filled out another form and faxed it to their office.

## 2014-12-05 NOTE — Telephone Encounter (Signed)
Lm for rtn call 

## 2014-12-08 NOTE — Telephone Encounter (Signed)
Spoke with patient about her forms, I am pretty sure Casimiro NeedleMichael had faxed forms to [facility] and aslo sent forms to scan file but it is not on epic, but it is not showing up yet. I will write a letter documenting her weight loss journey on epic.

## 2014-12-09 ENCOUNTER — Ambulatory Visit: Payer: 59 | Admitting: Dietician

## 2014-12-11 NOTE — Telephone Encounter (Signed)
Is this a script that we can provide for the patient or a letter? Please advise.

## 2014-12-11 NOTE — Telephone Encounter (Signed)
Dr. Conley RollsLe  Patient is requesting a call from you regarding a need for an aerodynamic chair for work, b/c of her back.   She would like to go into detail with you.   [phone]

## 2014-12-15 ENCOUNTER — Ambulatory Visit (INDEPENDENT_AMBULATORY_CARE_PROVIDER_SITE_OTHER): Payer: 59 | Admitting: Family Medicine

## 2014-12-15 ENCOUNTER — Encounter: Payer: Self-pay | Admitting: Family Medicine

## 2014-12-15 DIAGNOSIS — M545 Low back pain, unspecified: Secondary | ICD-10-CM

## 2014-12-15 DIAGNOSIS — I10 Essential (primary) hypertension: Secondary | ICD-10-CM

## 2014-12-15 DIAGNOSIS — M25532 Pain in left wrist: Secondary | ICD-10-CM

## 2014-12-15 DIAGNOSIS — M25562 Pain in left knee: Secondary | ICD-10-CM

## 2014-12-15 NOTE — Progress Notes (Signed)
Chief Complaint:  Chief Complaint  Patient presents with  . Follow-up    Gastric sleeve  . Back and knee pain    HPI: Amber Daniels is a 34 y.o. female who is here for:  Here for weight loss check, she is enrolled in the bariatric program and need 6 months of continuous doctor's visit .  She is doing well with the weight loss plan , her dad id not excited about her doing the surgery sicne he is afraid of the risk She has lost some weight since the last month , about 9 lbs.   She has constant back and knee pain not better with weight loss, 1 week ago she slid down her steps and braced herself with her wrist, and she thinks she did something to her wrist She is left handed , she has left ulnar wrist pain. Deneis weakness, but has carpal tunnell numbness and tingling and initially thought it was that She has a paragard so is not pregnant, she wants to do her xrays tomorrow sinc eshe doe snot have time today.  HAs not really taken anythign for it, she is weightbearing  BP Readings from Last 3 Encounters:  12/15/14 126/83  11/17/14 146/92  10/20/14 123/78    Wt Readings from Last 3 Encounters:  12/15/14 358 lb (162.388 kg)  11/17/14 367 lb 12.8 oz (166.833 kg)  10/20/14 374 lb 6.4 oz (169.827 kg)      Past Medical History  Diagnosis Date  . Asthma   . Postpartum care following cesarean delivery (12/2) 11/01/2013  . Morbid obesity   . Allergy   . Heart murmur   . Hypertension   . Sleep apnea     not on CPAP, dx 01/2013 with Outpatietn overnight study at home.    Past Surgical History  Procedure Laterality Date  . Cesarean section    . Cesarean section N/A 11/01/2013    Procedure: REPEAT CESAREAN SECTION;  Surgeon: Serita Kyle, MD;  Location: [facility];  Service: Obstetrics;  Laterality: N/A;  EDD: 11/04/13   History   Social History  . Marital Status: Single    Spouse Name: N/A    Number of Children: N/A  . Years of Education: N/A   Social History  Main Topics  . Smoking status: Never Smoker   . Smokeless tobacco: None  . Alcohol Use: 4.2 oz/week    7 Glasses of wine per week     Comment: 0 while pregnant  . Drug Use: No  . Sexual Activity: Yes    Birth Control/ Protection: IUD   Other Topics Concern  . None   Social History Narrative   Family History  Problem Relation Age of Onset  . Diabetes Mother   . Hypertension Mother   . Diabetes Father   . Hypertension Father   . Hypertension Brother   . Hypertension Maternal Grandmother   . Hypertension Maternal Grandfather    No Known Allergies Prior to Admission medications   Medication Sig Start Date End Date Taking? Authorizing Provider  acetaminophen (TYLENOL) 500 MG tablet Take 1,500 mg by mouth every 6 (six) hours as needed. For pain.   Yes Historical Provider, MD  albuterol (PROVENTIL HFA;VENTOLIN HFA) 108 (90 BASE) MCG/ACT inhaler Inhale 2 puffs into the lungs every 4 (four) hours as needed for wheezing or shortness of breath.   Yes Historical Provider, MD  beclomethasone (QVAR) 40 MCG/ACT inhaler Inhale 2 puffs into the lungs daily.  Yes Historical Provider, MD  chlorthalidone (HYGROTON) 25 MG tablet Take 1 tablet (25 mg total) by mouth daily. 09/11/14  Yes Maleeya Peterkin P Jizel Cheeks, DO  cyclobenzaprine (FLEXERIL) 5 MG tablet Take 1 tablet (5 mg total) by mouth 3 (three) times daily as needed for muscle spasms. 10/20/14  Yes Renji Berwick P Eryc Bodey, DO  fexofenadine (ALLEGRA) 60 MG tablet Take 60 mg by mouth daily.   Yes Historical Provider, MD  FLUOCINOLONE ACETONIDE SCALP 0.01 % OIL Use 1 oz on scalp and work through scalp for 5 min daily  and then thoroughly rinse with water 10/20/14  Yes Andalyn Heckstall P Hisae Decoursey, DO  ibuprofen (ADVIL,MOTRIN) 600 MG tablet Take 1 tablet (600 mg total) by mouth every 6 (six) hours as needed for mild pain. 11/04/13  Yes Demetrius Revel, NP  labetalol (NORMODYNE) 100 MG tablet Take 1 tablet (100 mg total) by mouth 2 (two) times daily. 09/11/14  Yes Saliah Crisp P Aurelia Gras, DO  montelukast  (SINGULAIR) 10 MG tablet Take 10 mg by mouth at bedtime.   Yes Historical Provider, MD  OVER THE COUNTER MEDICATION Norel AD Takes 4 times daily prn allergies   Yes Historical Provider, MD  ranitidine (ZANTAC) 150 MG tablet Take 1 tablet (150 mg total) by mouth 2 (two) times daily as needed for heartburn. 11/17/14  Yes Dolores Lory, PA-C     ROS: The patient denies fevers, chills, night sweats, unintentional weight loss, chest pain, palpitations, wheezing, dyspnea on exertion, nausea, vomiting, abdominal pain, dysuria, hematuria, melena, weakness  All other systems have been reviewed and were otherwise negative with the exception of those mentioned in the HPI and as above.    PHYSICAL EXAM: Filed Vitals:   12/15/14 1129  BP: 126/83  Pulse: 73  Temp: 97.9 F (36.6 C)  Resp: 16   Filed Vitals:   12/15/14 1129  Height:  (1.6 m)  Weight: 358 lb (162.388 kg)   Body mass index is 63.43 kg/(m^2).  General: Alert, no acute distress HEENT:  Normocephalic, atraumatic, oropharynx patent. EOMI, PERRLA Cardiovascular:  Regular rate and rhythm, no rubs murmurs or gallops.  No Carotid bruits, radial pulse intact. No pedal edema.  Respiratory: Clear to auscultation bilaterally.  No wheezes, rales, or rhonchi.  No cyanosis, no use of accessory musculature GI: No organomegaly, abdomen is soft and non-tender, positive bowel sounds.  No masses. Skin: No rashes. Neurologic: Facial musculature symmetric. Psychiatric: Patient is appropriate throughout our interaction. Lymphatic: No cervical lymphadenopathy Musculoskeletal: Gait intact. + knee crepitus, neg Lachman, neg McMurray, stable to varus and valgus, 5/5 stren, senstation intact, neg saddle anesthesia + left ulnar tenderness of wrist, decrease ROM due to pain, no swelling 5/5 grip strength NEg defomity   LABS: Results for orders placed or performed in visit on 08/21/14  Troponin I  Result Value Ref Range   Troponin I <0.01  <0.06 ng/mL  Comprehensive metabolic panel  Result Value Ref Range   Sodium 140 135 - 145 mEq/L   Potassium 4.1 3.5 - 5.3 mEq/L   Chloride 105 96 - 112 mEq/L   CO2 27 19 - 32 mEq/L   Glucose, Bld 88 70 - 99 mg/dL   BUN 9 6 - 23 mg/dL   Creat 1.61 0.96 - 0.45 mg/dL   Total Bilirubin 0.4 0.2 - 1.2 mg/dL   Alkaline Phosphatase 83 39 - 117 U/L   AST 14 0 - 37 U/L   ALT 21 0 - 35 U/L   Total Protein 7.0 6.0 -  8.3 g/dL   Albumin 4.2 3.5 - 5.2 g/dL   Calcium 9.2 8.4 - 16.110.5 mg/dL  POCT glucose (manual entry)  Result Value Ref Range   POC Glucose 104 (A) 70 - 99 mg/dl  POCT CBC  Result Value Ref Range   WBC 7.3 4.6 - 10.2 K/uL   Lymph, poc 1.8 0.6 - 3.4   POC LYMPH PERCENT 25.2 10 - 50 %L   MID (cbc) 0.5 0 - 0.9   POC MID % 6.8 0 - 12 %M   POC Granulocyte 5.0 2 - 6.9   Granulocyte percent 68.0 37 - 80 %G   RBC 4.77 4.04 - 5.48 M/uL   Hemoglobin 11.7 (A) 12.2 - 16.2 g/dL   HCT, POC 09.638.6 04.537.7 - 47.9 %   MCV 80.8 80 - 97 fL   MCH, POC 24.5 (A) 27 - 31.2 pg   MCHC 30.4 (A) 31.8 - 35.4 g/dL   RDW, POC 40.918.0 %   Platelet Count, POC 276 142 - 424 K/uL   MPV 8.3 0 - 99.8 fL     EKG/XRAY:   Primary read interpreted [facility]   ASSESSMENT/PLAN: Encounter Diagnoses  Name Primary?  . Left knee pain   . Left wrist pain   . Midline low back pain without sciatica   . Morbid obesity Yes  . Essential hypertension    Weight loss , about 9 lbs in last month, she will cont to f/u per bariatric weightloss protocol [facility], patient has to leave of wrist and knee F/u in 1 month   Gross sideeffects, risk and benefits, and alternatives of medications d/w patient. Patient is aware that all medications have potential sideeffects and we are unable to predict every sideeffect or drug-drug interaction that may occur.  Hamilton CapriLE, Toinette Lackie PHUONG, DO 12/15/2014 2:54 PM

## 2014-12-18 ENCOUNTER — Ambulatory Visit (INDEPENDENT_AMBULATORY_CARE_PROVIDER_SITE_OTHER): Payer: 59 | Admitting: Neurology

## 2014-12-18 ENCOUNTER — Encounter: Payer: Self-pay | Admitting: Neurology

## 2014-12-18 VITALS — BP 128/76 | HR 89 | Resp 15 | Ht 63.0 in | Wt 358.6 lb

## 2014-12-18 DIAGNOSIS — R0683 Snoring: Secondary | ICD-10-CM

## 2014-12-18 DIAGNOSIS — E662 Morbid (severe) obesity with alveolar hypoventilation: Secondary | ICD-10-CM | POA: Insufficient documentation

## 2014-12-18 DIAGNOSIS — J4531 Mild persistent asthma with (acute) exacerbation: Secondary | ICD-10-CM

## 2014-12-18 DIAGNOSIS — G4700 Insomnia, unspecified: Secondary | ICD-10-CM

## 2014-12-18 DIAGNOSIS — G4730 Sleep apnea, unspecified: Secondary | ICD-10-CM

## 2014-12-18 DIAGNOSIS — J45901 Unspecified asthma with (acute) exacerbation: Secondary | ICD-10-CM | POA: Insufficient documentation

## 2014-12-18 NOTE — Progress Notes (Signed)
[facility]INE [facility]INIC   Provider:  Melvyn Novas, M D  Referring Provider: Lenell Antu, DO Primary Care Physician:  Rockne Coons, DO  Chief Complaint  Patient presents with  . NP  Sleep consult Amber Hack, MD)    Rm 10, alone    HPI:  Amber Daniels is a 34 y.o. female seen here as a referral  from Dr. Conley Rolls for  a sleep consultation,   Amber Daniels is a morbidly obese young mother of 2 with high blood pressure , ankle edema and continued weight gain. She is planning to undergo a gastric sleeve surgery to help surgically with weight loss.  Until recently , she was followed by Dr. Parke Simmers until years end , when the PCP couldn't take her insurance any longer. Before this occurred , she had been referred for a home sleep test and was told that she will need CPAP .  She had no follow up with the physician, never got the results on paper or in discussion and the CPAP was never delivered. She changed PCPs and is now at " square 1".    The patient endorsed the patient states that she will put her 43-year-old daughter to bed at about 9 PM and that shortly after she will put her 21-year-old to bed. By the children sleeps through the night the patient does not.  She goes to bed at 10.30 . Sometimes she will wake up one sometimes twice, sometimes she will wake up because her arm began tingling  (when she sleeps on her side) and she points to her left arm, sometimes it will be a bathroom break.  She can no longer sleep on her back, due to breathing problems.  She sleeps propped up with one pillow only. She has trouble sleeping when the surrounding temperature as high, she prefers her bedroom quiet, dark and cool. She will sleep his humidifier and sometimes does a fine in the background to create some ambient noise as well as a feeling of cooling herself. She reports that she has to rely on an alarm and does not wake up spontaneously in the morning, her usual rise time is 6:30 AM. No caffeine in AM and no  longer Soda in the daytime.  She usually does not take breakfast at home before going to work. Her 36-year-old daughter will rise around 7 AM or shortly before. And she will drop her off at school on her way to work. Her work environment last natural daylight, she sitting at a desk with a window., [place] facing. He estimates her overnight sleep time only to encompass 5 hours. The father of her children has noted that she is snoring loudly and has commented on such. He has not mentioned any apneas been witnessed,  But she she wakes with a dry mouth, cough and  severe headaches.   Review of Systems: Out of a complete 14 system review, the patient complains of only the following symptoms, and all other reviewed systems are negative. The following review of systems symptoms palpitations of the heart heart murmur frequently swelling in the legs and ankles ringing in her ears, skin rash, shortness of breath, snoring, runny nose, headaches, dizziness, snoring. Past medical history hypertension migraine obesity. Her past surgical history only includes a C-section from 2008 and 1 from 2014  Social history the patient drinks socially wine only occur, she does not use any recreational drugs and is not a tobacco user has never smoked, she does drink  a soda and coffee.  Family history is positive for diabetes high blood pressure, hypercholesterolemia in her father and environmental allergies. Both parents are alive. The list of medications was reviewed I do not see any medications that would leave the patient excessively daytime sleepy or fatigued except for Flexeril which he takes as needed for muscle spasms. She does take Proventil and Ventolin inhalers for asthma. As well as Qvar  The fatigue score was endorsed at 36 points and the Epworth sleepiness score at 5 points, GD score at zero points.      History   Social History  . Marital Status: Single    Spouse Name: N/A    Number of Children: N/A  . Years of  Education: N/A   Occupational History  . Not on file.   Social History Main Topics  . Smoking status: Never Smoker   . Smokeless tobacco: Not on file  . Alcohol Use: 4.2 oz/week    7 Glasses of wine per week     Comment: 0 while pregnant  . Drug Use: No  . Sexual Activity: Yes    Birth Control/ Protection: IUD   Other Topics Concern  . Not on file   Social History Narrative   Caffeine none.  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      Family History  Problem Relation Age of Onset  . Diabetes Mother   . Hypertension Mother   . Diabetes Father   . Hypertension Father   . Hypertension Brother   . Hypertension Maternal Grandmother   . Hypertension Maternal Grandfather     Past Medical History  Diagnosis Date  . Asthma   . Postpartum care following cesarean delivery (12/2) 11/01/2013  . Morbid obesity   . Allergy   . Heart murmur   . Hypertension   . Sleep apnea     not on CPAP, dx 01/2013 with Outpatietn overnight study at home.     Past Surgical History  Procedure Laterality Date  . Cesarean section    . Cesarean section N/A 11/01/2013    Procedure: REPEAT CESAREAN SECTION;  Surgeon: Serita KyleSheronette A Cousins, MD;  Location: [facility];  Service: Obstetrics;  Laterality: N/A;  EDD: 11/04/13    Current Outpatient Prescriptions  Medication Sig Dispense Refill  . acetaminophen (TYLENOL) 500 MG tablet Take 1,500 mg by mouth every 6 (six) hours as needed. For pain.    Marland Kitchen. albuterol (PROVENTIL HFA;VENTOLIN HFA) 108 (90 BASE) MCG/ACT inhaler Inhale 2 puffs into the lungs every 4 (four) hours as needed for wheezing or shortness of breath.    . beclomethasone (QVAR) 40 MCG/ACT inhaler Inhale 2 puffs into the lungs daily.    . chlorthalidone (HYGROTON) 25 MG tablet Take 1 tablet (25 mg total) by mouth daily. 30 tablet 5  . cyclobenzaprine (FLEXERIL) 5 MG tablet Take 1 tablet (5 mg total) by mouth 3 (three) times daily as needed for muscle spasms. 30 tablet 0  . fexofenadine  (ALLEGRA) 60 MG tablet Take 60 mg by mouth daily.    Marland Kitchen. FLUOCINOLONE ACETONIDE SCALP 0.01 % OIL Use 1 oz on scalp and work through scalp for 5 min daily  and then thoroughly rinse with water 118 mL 0  . ibuprofen (ADVIL,MOTRIN) 600 MG tablet Take 1 tablet (600 mg total) by mouth every 6 (six) hours as needed for mild pain. 30 tablet 1  . labetalol (NORMODYNE) 100 MG tablet Take 1 tablet (100 mg total) by mouth 2 (two)  times daily. 60 tablet 5  . montelukast (SINGULAIR) 10 MG tablet Take 10 mg by mouth at bedtime.    Maudry Mayhew AD 4-10-325 MG TABS Take 1 tablet by mouth 4 (four) times daily.  2  . OVER THE COUNTER MEDICATION Norel AD Takes 4 times daily prn allergies    . ranitidine (ZANTAC) 150 MG tablet Take 1 tablet (150 mg total) by mouth 2 (two) times daily as needed for heartburn. 60 tablet 0   No current facility-administered medications for this visit.    Allergies as of 12/18/2014 - Review Complete 12/18/2014  Allergen Reaction Noted  . Other  12/18/2014    Vitals: BP 128/76 mmHg  Pulse 89  Resp 15  Ht  (1.6 m)  Wt 358 lb 9.6 oz (162.66 kg)  BMI 63.54 kg/m2 Last Weight:  Wt Readings from Last 1 Encounters:  12/18/14 358 lb 9.6 oz (162.66 kg)       Last Height:   Ht Readings from Last 1 Encounters:  12/18/14  (1.6 m)    Physical exam:  General: The patient is awake, alert and appears not in acute distress. The patient is well groomed. Head: Normocephalic, atraumatic. Neck is supple. Mallampati 3 , the patient has a small uvula in the midst of a peaked palate restricted by lateral pillars. neck circumference:  16.5. Nasal airflow unrestricted today, TMJ is  not evident . Retrognathia is not seen.  Cardiovascular:  Regular rate and rhythm, without  murmurs or carotid bruit, and without distended neck veins. Respiratory: Lungs are clear to auscultation. No wheezing detected in the upper lung fields Skin:  evidence of  Ankle edema. Trunk: BMI is severely elevated and  patient  has normal posture.  Neurologic exam : The patient is awake and alert, oriented to place and time.   Memory subjective  described as intact. There is a normal attention span & concentration ability. Speech is fluent without  dysarthria, dysphonia or aphasia. Mood and affect are appropriate.  Cranial nerves: Pupils are equal and briskly reactive to light. Funduscopic exam without  evidence of pallor or edema. Extraocular movements  in vertical and horizontal planes intact and without nystagmus. Visual fields by finger perimetry are intact. Hearing to finger rub intact.  Facial sensation intact to fine touch. Facial motor strength is symmetric and tongue and uvula move midline.  Motor exam:   Normal tone, muscle bulk and symmetric strength in all extremities. Both pregnancies were associated with onset of carpal tunnel. She has grip strength weakness .  Sensory:  Fine touch, pinprick and vibration were tested in all extremities.  Tingling in the ring finger and small finger of the right hand , pinch strength weakness is also restricted. Proprioception is normal.  Coordination: Rapid alternating movements in the fingers/hands is normal. Finger-to-nose maneuver  normal without evidence of ataxia, dysmetria or tremor.  Gait and station: Patient walks without assistive device and is able unassisted to climb up to the exam table. Strength within normal limits. Stance is stable and normal.  Deep tendon reflexes: in the  upper and lower extremities are symmetrically attenuated related  to obesity. Babinski maneuver response is downgoing.   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 The patient's BMI of over 60 is a severe risk factor and asthma as a contributing and overlapping factor in restricting gas exchange from oxygen to carbon  dioxide. The patient wakes up with severe headaches which is an indicator for hypercapnia. She is fatigued to an average degree and she is not truly daytime sleepy, for example she never has sleep attack or even the desire to take a nap in the daytime as long as she works from 8-5. Our goal is to find out how many apneic events occur per hour of sleep and if these are associated with hypoxemia and hypercapnia, then treat appropriately with CPAP. In a mild apnea patient without gas exchange impairment a dental device can be used but this is unlikely to help a patient like Amber Daniels. 2) asthma  3) Morbid obesity 4) HTN with ankle edema.    The patient was advised of the nature of the diagnosed sleep disorder , the treatment options and risks for general a health and wellness arising from not treating the condition. Visit duration was 45  Minutes with 50% of the face to face time related to information about Sleep apnea risks, versus upper airway restriction versus risk factors and their needed treatment. The patient was advised that a SPLIT study shall be performed.  CPAP may be applied , depending on her insurance guidelines. .   Plan:  Treatment plan and additional workup :  I will ask the patient to continue her current asthma and hypertension treatment as prescribed by her primary care physician, and to bring the medication to the sleep lab appointment so she has an available apnea at night if needed. We will perform a split-night study split at AHI of 20, score at 4% oxygen saturation by [place] healthcare criteria. I am optimistic that the patient's interrupted sleep and quality of sleep deprivation is related to an underlying apnea syndrome. It is less likely for Amber Daniels to have central sleep apnea. We will meet with out patients within  60 days after the sleep study - usually within 30 days after CPAP has been prescribed. The follow-up will be performed by myself.   Amber Daniels main  underlying risk factor is her obesity and she is aware of this and prepared to undergo a gastric sleeve for bypass surgery. It is imperative in preparation for the surgical steps to have a sleep study.  Sincerely,     Melvyn Novas MD  12/18/2014

## 2014-12-18 NOTE — Patient Instructions (Signed)
Sleep Apnea Sleep apnea is disorder that affects a person's sleep. A person with sleep apnea has abnormal pauses in their breathing when they sleep. It is hard for them to get a good sleep. This makes a person tired during the day. It also can lead to other physical problems. There are three types of sleep apnea. One type is when breathing stops for a short time because your airway is blocked (obstructive sleep apnea). Another type is when the brain sometimes fails to give the normal signal to breathe to the muscles that control your breathing (central sleep apnea). The third type is a combination of the other two types. HOME CARE  Do not sleep on your back. Try to sleep on your side.  Take all medicine as told by your doctor.  Avoid alcohol, calming medicines (sedatives), and depressant drugs.  Try to lose weight if you are overweight. Talk to your doctor about a healthy weight goal. Your doctor may have you use a device that helps to open your airway. It can help you get the air that you need. It is called a positive airway pressure (PAP) device. There are three types of PAP devices:  Continuous positive airway pressure (CPAP) device.  Nasal expiratory positive airway pressure (EPAP) device.  Bilevel positive airway pressure (BPAP) device. MAKE SURE YOU:  Understand these instructions.  Will watch your condition.  Will get help right away if you are not doing well or get worse. Document Released: 08/26/2008 Document Revised: 11/03/2012 Document Reviewed: 03/20/2012 [facility] Patient Information 2015 [place], [place]. This information is not intended to replace advice given to you by your health care provider. Make sure you discuss any questions you have with your health care provider. Obesity Obesity is defined as having too much total body fat and a body mass index (BMI) of 30 or more. BMI is an estimate of body fat and is calculated from your height and weight. Obesity happens when you  consume more calories than you can burn by exercising or performing daily physical tasks. Prolonged obesity can cause major illnesses or emergencies, such as:   Stroke.  Heart disease.  Diabetes.  Cancer.  Arthritis.  High blood pressure (hypertension).  High cholesterol.  Sleep apnea.  Erectile dysfunction.  Infertility problems. CAUSES   Regularly eating unhealthy foods.  Physical inactivity.  Certain disorders, such as an underactive thyroid (hypothyroidism), Cushing's syndrome, and polycystic ovarian syndrome.  Certain medicines, such as steroids, some depression medicines, and antipsychotics.  Genetics.  Lack of sleep. DIAGNOSIS  A health care provider can diagnose obesity after calculating your BMI. Obesity will be diagnosed if your BMI is 30 or higher.  There are other methods of measuring obesity levels. Some other methods include measuring your skinfold thickness, your waist circumference, and comparing your hip circumference to your waist circumference. TREATMENT  A healthy treatment program includes some or all of the following:  Long-term dietary changes.  Exercise and physical activity.  Behavioral and lifestyle changes.  Medicine only under the supervision of your health care provider. Medicines may help, but only if they are used with diet and exercise programs. An unhealthy treatment program includes:  Fasting.  Fad diets.  Supplements and drugs. These choices do not succeed in long-term weight control.  HOME CARE INSTRUCTIONS   Exercise and perform physical activity as directed by your health care provider. To increase physical activity, try the following:  Use stairs instead of elevators.  Park farther away from store entrances.  Garden,  bike, or walk instead of watching television or using the computer.  Eat healthy, low-calorie foods and drinks on a regular basis. Eat more fruits and vegetables. Use low-calorie cookbooks or take  healthy cooking classes.  Limit fast food, sweets, and processed snack foods.  Eat smaller portions.  Keep a daily journal of everything you eat. There are many free websites to help you with this. It may be helpful to measure your foods so you can determine if you are eating the correct portion sizes.  Avoid drinking alcohol. Drink more water and drinks without calories.  Take vitamins and supplements only as recommended by your health care provider.  Weight-loss support groups, Government social research officerregistered dietitians, counselors, and stress reduction education can also be very helpful. SEEK IMMEDIATE MEDICAL CARE IF:  You have chest pain or tightness.  You have trouble breathing or feel short of breath.  You have weakness or leg numbness.  You feel confused or have trouble talking.  You have sudden changes in your vision. MAKE SURE YOU:  Understand these instructions.  Will watch your condition.  Will get help right away if you are not doing well or get worse. Document Released: 12/25/2004 Document Revised: 04/03/2014 Document Reviewed: 12/24/2011 [facility] Patient Information 2015 [place]. This information is not intended to replace advice given to you by your health care provider. Make sure you discuss any questions you have with your health care provider.

## 2014-12-19 LAB — HEMOGLOBIN A1C
Hgb A1c MFr Bld: 5.8 % — ABNORMAL HIGH (ref <5.7)
MEAN PLASMA GLUCOSE: 120 mg/dL — AB (ref <117)

## 2014-12-19 LAB — COMPREHENSIVE METABOLIC PANEL
ALBUMIN: 4.1 g/dL (ref 3.5–5.2)
ALT: 38 U/L — ABNORMAL HIGH (ref 0–35)
AST: 21 U/L (ref 0–37)
Alkaline Phosphatase: 84 U/L (ref 39–117)
BILIRUBIN TOTAL: 0.5 mg/dL (ref 0.2–1.2)
BUN: 9 mg/dL (ref 6–23)
CALCIUM: 9 mg/dL (ref 8.4–10.5)
CO2: 26 meq/L (ref 19–32)
Chloride: 106 mEq/L (ref 96–112)
Creat: 0.59 mg/dL (ref 0.50–1.10)
GLUCOSE: 107 mg/dL — AB (ref 70–99)
Potassium: 3.8 mEq/L (ref 3.5–5.3)
Sodium: 142 mEq/L (ref 135–145)
TOTAL PROTEIN: 7.1 g/dL (ref 6.0–8.3)

## 2014-12-19 LAB — CBC
HEMATOCRIT: 37.8 % (ref 36.0–46.0)
Hemoglobin: 12.1 g/dL (ref 12.0–15.0)
MCH: 25.7 pg — ABNORMAL LOW (ref 26.0–34.0)
MCHC: 32 g/dL (ref 30.0–36.0)
MCV: 80.4 fL (ref 78.0–100.0)
MPV: 10.3 fL (ref 9.4–12.4)
Platelets: 276 10*3/uL (ref 150–400)
RBC: 4.7 MIL/uL (ref 3.87–5.11)
RDW: 16.4 % — AB (ref 11.5–15.5)
WBC: 5.5 10*3/uL (ref 4.0–10.5)

## 2014-12-19 LAB — LIPID PANEL
Cholesterol: 117 mg/dL (ref 0–200)
HDL: 43 mg/dL (ref >39)
LDL CALC: 61 mg/dL (ref 0–99)
TRIGLYCERIDES: 65 mg/dL (ref <150)
Total CHOL/HDL Ratio: 2.7 Ratio
VLDL: 13 mg/dL (ref 0–40)

## 2014-12-19 LAB — IRON AND TIBC
%SAT: 12 % — ABNORMAL LOW (ref 20–55)
IRON: 49 ug/dL (ref 42–145)
TIBC: 402 ug/dL (ref 250–470)
UIBC: 353 ug/dL (ref 125–400)

## 2014-12-19 LAB — T4: T4, Total: 8.5 ug/dL (ref 4.5–12.0)

## 2014-12-19 LAB — PROTIME-INR
INR: 1.02 (ref <1.50)
Prothrombin Time: 13.4 seconds (ref 11.6–15.2)

## 2014-12-19 LAB — VITAMIN B12: Vitamin B-12: 716 pg/mL (ref 211–911)

## 2014-12-19 LAB — TSH: TSH: 1.526 u[IU]/mL (ref 0.350–4.500)

## 2014-12-19 LAB — FOLATE: Folate: 13.5 ng/mL

## 2014-12-20 LAB — URINALYSIS
BILIRUBIN URINE: NEGATIVE
Glucose, UA: NEGATIVE mg/dL
Hgb urine dipstick: NEGATIVE
Ketones, ur: NEGATIVE mg/dL
LEUKOCYTES UA: NEGATIVE
Nitrite: NEGATIVE
PH: 6 (ref 5.0–8.0)
Protein, ur: NEGATIVE mg/dL
Specific Gravity, Urine: 1.027 (ref 1.005–1.030)
Urobilinogen, UA: 1 mg/dL (ref 0.0–1.0)

## 2014-12-20 LAB — PREGNANCY, URINE: Preg Test, Ur: NEGATIVE

## 2014-12-25 ENCOUNTER — Encounter (HOSPITAL_COMMUNITY): Admission: RE | Payer: Self-pay | Source: Ambulatory Visit

## 2014-12-25 ENCOUNTER — Ambulatory Visit (HOSPITAL_COMMUNITY)
Admission: RE | Admit: 2014-12-25 | Discharge: 2014-12-25 | Disposition: A | Discharge status: Home / Self Care | Payer: 59 | Source: Ambulatory Visit | Attending: Surgery | Admitting: Surgery

## 2014-12-25 ENCOUNTER — Other Ambulatory Visit: Payer: Self-pay

## 2014-12-25 ENCOUNTER — Ambulatory Visit
Admission: RE | Admit: 2014-12-25 | Discharge: 2014-12-25 | Disposition: A | Discharge status: Home / Self Care | Payer: 59 | Source: Ambulatory Visit | Attending: Family Medicine | Admitting: Family Medicine

## 2014-12-25 ENCOUNTER — Ambulatory Visit (HOSPITAL_COMMUNITY): Admission: RE | Admit: 2014-12-25 | Payer: 59 | Source: Ambulatory Visit | Admitting: Surgery

## 2014-12-25 DIAGNOSIS — M25532 Pain in left wrist: Secondary | ICD-10-CM

## 2014-12-25 DIAGNOSIS — I10 Essential (primary) hypertension: Secondary | ICD-10-CM | POA: Diagnosis not present

## 2014-12-25 DIAGNOSIS — M545 Low back pain, unspecified: Secondary | ICD-10-CM

## 2014-12-25 DIAGNOSIS — R0683 Snoring: Secondary | ICD-10-CM | POA: Diagnosis not present

## 2014-12-25 DIAGNOSIS — R600 Localized edema: Secondary | ICD-10-CM | POA: Insufficient documentation

## 2014-12-25 DIAGNOSIS — Z6841 Body Mass Index (BMI) 40.0 and over, adult: Secondary | ICD-10-CM | POA: Insufficient documentation

## 2014-12-25 DIAGNOSIS — E662 Morbid (severe) obesity with alveolar hypoventilation: Secondary | ICD-10-CM | POA: Insufficient documentation

## 2014-12-25 DIAGNOSIS — J45901 Unspecified asthma with (acute) exacerbation: Secondary | ICD-10-CM | POA: Diagnosis not present

## 2014-12-25 DIAGNOSIS — K219 Gastro-esophageal reflux disease without esophagitis: Secondary | ICD-10-CM | POA: Diagnosis not present

## 2014-12-25 DIAGNOSIS — M25562 Pain in left knee: Secondary | ICD-10-CM

## 2014-12-25 SURGERY — BREATH TEST, FOR HELICOBACTER PYLORI

## 2014-12-26 ENCOUNTER — Ambulatory Visit (INDEPENDENT_AMBULATORY_CARE_PROVIDER_SITE_OTHER): Payer: 59 | Admitting: Neurology

## 2014-12-26 DIAGNOSIS — G4700 Insomnia, unspecified: Secondary | ICD-10-CM

## 2014-12-26 DIAGNOSIS — G4730 Sleep apnea, unspecified: Secondary | ICD-10-CM

## 2014-12-26 DIAGNOSIS — E662 Morbid (severe) obesity with alveolar hypoventilation: Secondary | ICD-10-CM

## 2014-12-26 DIAGNOSIS — R0683 Snoring: Secondary | ICD-10-CM

## 2014-12-26 DIAGNOSIS — J4531 Mild persistent asthma with (acute) exacerbation: Secondary | ICD-10-CM

## 2014-12-27 NOTE — Sleep Study (Signed)
Please see the scanned sleep study interpretation located in the Procedure tab within the Chart Review section. 

## 2014-12-28 ENCOUNTER — Ambulatory Visit: Payer: Medicaid Other | Admitting: Dietician

## 2014-12-28 ENCOUNTER — Telehealth: Payer: Self-pay

## 2014-12-28 NOTE — Telephone Encounter (Signed)
This was faxed to [phone] and failed. Pt requested a re fax to [phone] This has been completed.

## 2014-12-28 NOTE — Telephone Encounter (Addendum)
Patient says Amber Daniels was supposed to fax something to patients fax machine and she never received it. It was an excuse note for work saying she was seen at the clinic. Called front desk to see if they have it in fax bin but there is not a copy of it? Please advise. Patient says she was waiting on this.   Best: [phone]

## 2015-01-01 ENCOUNTER — Telehealth: Payer: Self-pay | Admitting: *Deleted

## 2015-01-01 ENCOUNTER — Encounter: Payer: Self-pay | Admitting: Neurology

## 2015-01-01 NOTE — Telephone Encounter (Signed)
Patient was contacted and provided the results of her sleep study which did not reveal significant sleep apnea.  She was informed a follow up appointment could be scheduled in our office to go over the results.  Dr. Hamilton Caprihao Le was faxed a copy of the report.

## 2015-01-03 DIAGNOSIS — G4700 Insomnia, unspecified: Secondary | ICD-10-CM | POA: Insufficient documentation

## 2015-01-03 DIAGNOSIS — G4730 Sleep apnea, unspecified: Secondary | ICD-10-CM

## 2015-01-12 ENCOUNTER — Encounter: Payer: Self-pay | Admitting: Family Medicine

## 2015-01-12 ENCOUNTER — Ambulatory Visit (INDEPENDENT_AMBULATORY_CARE_PROVIDER_SITE_OTHER): Payer: 59 | Admitting: Family Medicine

## 2015-01-12 VITALS — BP 124/77 | HR 78 | Temp 98.5°F | Resp 16 | Ht 63.75 in | Wt 359.0 lb

## 2015-01-12 DIAGNOSIS — L218 Other seborrheic dermatitis: Secondary | ICD-10-CM

## 2015-01-12 DIAGNOSIS — I10 Essential (primary) hypertension: Secondary | ICD-10-CM

## 2015-01-12 DIAGNOSIS — J45909 Unspecified asthma, uncomplicated: Secondary | ICD-10-CM

## 2015-01-12 MED ORDER — KETOCONAZOLE 2 % EX SHAM: 1.0000 "application " | MEDICATED_SHAMPOO | CUTANEOUS | Status: DC

## 2015-01-12 NOTE — Progress Notes (Signed)
 Chief Complaint:  Chief Complaint  Patient presents with  . Weight loss visit  . Headache    HPI: Amber Daniels is a 34 y.o. female who is here for  Weight check Has not been exercising, she is eating the same and di splurge and she is holding, seh splurged on donut world, [place] had scallops, with rice habachi so nothing different but she thinks she is not taking off weight because she is not exercising as much as she should She had HA but she has had ti before, ahs not taken wnythign for it She cont to have knee pain, there is noise to her knees when she moves it   BP Readings from Last 3 Encounters:  01/12/15 124/77  12/18/14 128/76  12/15/14 126/83   Wt Readings from Last 3 Encounters:  01/12/15 359 lb (162.841 kg)  12/18/14 358 lb 9.6 oz (162.66 kg)  12/15/14 358 lb (162.388 kg)     Past Medical History  Diagnosis Date  . Asthma   . Postpartum care following cesarean delivery (12/2) 11/01/2013  . Morbid obesity   . Allergy   . Heart murmur   . Hypertension   . Sleep apnea     not on CPAP, dx 01/2013 with Outpatietn overnight study at home.    Past Surgical History  Procedure Laterality Date  . Cesarean section    . Cesarean section N/A 11/01/2013    Procedure: REPEAT CESAREAN SECTION;  Surgeon: Marvene Staff, MD;  Location: [facility];  Service: Obstetrics;  Laterality: N/A;  EDD: 11/04/13   History   Social History  . Marital Status: Single    Spouse Name: N/A  . Number of Children: N/A  . Years of Education: N/A   Social History Main Topics  . Smoking status: Never Smoker   . Smokeless tobacco: Not on file  . Alcohol Use: 4.2 oz/week    7 Glasses of wine per week     Comment: 0 while pregnant  . Drug Use: No  . Sexual Activity: Yes    Birth Control/ Protection: IUD   Other Topics Concern  . None   Social History Narrative   Caffeine none.  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     Family History  Problem Relation Age of  Onset  . Diabetes Mother   . Hypertension Mother   . Diabetes Father   . Hypertension Father   . Hypertension Brother   . Hypertension Maternal Grandmother   . Hypertension Maternal Grandfather    Allergies  Allergen Reactions  . Other     Seasonal and enviromental allergies (dust)   Prior to Admission medications   Medication Sig Start Date End Date Taking? Authorizing Provider  acetaminophen (TYLENOL) 500 MG tablet Take 1,500 mg by mouth every 6 (six) hours as needed. For pain.   Yes Historical Provider, MD  albuterol (PROVENTIL HFA;VENTOLIN HFA) 108 (90 BASE) MCG/ACT inhaler Inhale 2 puffs into the lungs every 4 (four) hours as needed for wheezing or shortness of breath.   Yes Historical Provider, MD  beclomethasone (QVAR) 40 MCG/ACT inhaler Inhale 2 puffs into the lungs daily.   Yes Historical Provider, MD  chlorthalidone (HYGROTON) 25 MG tablet Take 1 tablet (25 mg total) by mouth daily. 09/11/14  Yes  P , DO  cyclobenzaprine (FLEXERIL) 5 MG tablet Take 1 tablet (5 mg total) by mouth 3 (three) times daily as needed for muscle spasms. 10/20/14  Yes   P , DO  fexofenadine (ALLEGRA) 60 MG tablet Take 60 mg by mouth daily.   Yes Historical Provider, MD  FLUOCINOLONE ACETONIDE SCALP 0.01 % OIL Use 1 oz on scalp and work through scalp for 5 min daily  and then thoroughly rinse with water 10/20/14  Yes  P , DO  ibuprofen (ADVIL,MOTRIN) 600 MG tablet Take 1 tablet (600 mg total) by mouth every 6 (six) hours as needed for mild pain. 11/04/13  Yes Darleen Crocker, NP  labetalol (NORMODYNE) 100 MG tablet Take 1 tablet (100 mg total) by mouth 2 (two) times daily. 09/11/14  Yes  P , DO  montelukast (SINGULAIR) 10 MG tablet Take 10 mg by mouth at bedtime.   Yes Historical Provider, MD  NOREL AD 4-10-325 MG TABS Take 1 tablet by mouth 4 (four) times daily. 12/07/14  Yes Historical Provider, MD  OVER THE COUNTER MEDICATION Norel AD Takes 4 times daily prn allergies   Yes Historical  Provider, MD  ranitidine (ZANTAC) 150 MG tablet Take 1 tablet (150 mg total) by mouth 2 (two) times daily as needed for heartburn. 11/17/14  Yes Kathlen Brunswick, PA-C     ROS: The patient denies fevers, chills, night sweats, unintentional weight loss, chest pain, palpitations, wheezing, dyspnea on exertion, nausea, vomiting, abdominal pain, dysuria, hematuria, melena  All other systems have been reviewed and were otherwise negative with the exception of those mentioned in the HPI and as above.    PHYSICAL EXAM: Filed Vitals:   01/12/15 1111  BP: 124/77  Pulse: 78  Temp: 98.5 F (36.9 C)  Resp: 16   Filed Vitals:   01/12/15 1111  Height: 5' 3.75" (1.619 m)  Weight: 359 lb (162.841 kg)   Body mass index is 62.13 kg/(m^2).  General: Alert, no acute distress, morbidly obese HEENT:  Normocephalic, atraumatic, oropharynx patent. EOMI, PERRLA, fundo exam nl Cardiovascular:  Regular rate and rhythm, no rubs murmurs or gallops.  Radial pulse intact. No pedal edema.  Respiratory: Clear to auscultation bilaterally.  No wheezes, rales, or rhonchi.  No cyanosis, no use of accessory musculature GI: No organomegaly, abdomen is soft and non-tender, positive bowel sounds.  No masses. Skin: + scalp rashes. Neurologic: Facial musculature symmetric. Psychiatric: Patient is appropriate throughout our interaction. Lymphatic: No cervical lymphadenopathy Musculoskeletal: Gait intact.   LABS: Results for orders placed or performed in visit on 11/16/14  CBC  Result Value Ref Range   WBC 5.5 4.0 - 10.5 K/uL   RBC 4.70 3.87 - 5.11 MIL/uL   Hemoglobin 12.1 12.0 - 15.0 g/dL   HCT 37.8 36.0 - 46.0 %   MCV 80.4 78.0 - 100.0 fL   MCH 25.7 (L) 26.0 - 34.0 pg   MCHC 32.0 30.0 - 36.0 g/dL   RDW 16.4 (H) 11.5 - 15.5 %   Platelets 276 150 - 400 K/uL   MPV 10.3 9.4 - 12.4 fL  Comp Met (CMET)  Result Value Ref Range   Sodium 142 135 - 145 mEq/L   Potassium 3.8 3.5 - 5.3 mEq/L   Chloride 106 96 - 112  mEq/L   CO2 26 19 - 32 mEq/L   Glucose, Bld 107 (H) 70 - 99 mg/dL   BUN 9 6 - 23 mg/dL   Creat 0.59 0.50 - 1.10 mg/dL   Total Bilirubin 0.5 0.2 - 1.2 mg/dL   Alkaline Phosphatase 84 39 - 117 U/L   AST 21 0 - 37 U/L   ALT 38 (H) 0 -  35 U/L   Total Protein 7.1 6.0 - 8.3 g/dL   Albumin 4.1 3.5 - 5.2 g/dL   Calcium 9.0 8.4 - 10.5 mg/dL  TSH  Result Value Ref Range   TSH 1.526 0.350 - 4.500 uIU/mL  T4  Result Value Ref Range   T4, Total 8.5 4.5 - 12.0 ug/dL  HgB A1c  Result Value Ref Range   Hgb A1c MFr Bld 5.8 (H) <5.7 %   Mean Plasma Glucose 120 (H) <117 mg/dL  Pregnancy, urine  Result Value Ref Range   Preg Test, Ur NEG   Lipid Profile  Result Value Ref Range   Cholesterol 117 0 - 200 mg/dL   Triglycerides 65 <150 mg/dL   HDL 43 >39 mg/dL   Total CHOL/HDL Ratio 2.7 Ratio   VLDL 13 0 - 40 mg/dL   LDL Cholesterol 61 0 - 99 mg/dL  Urinalysis  Result Value Ref Range   Color, Urine YELLOW YELLOW   APPearance CAR CAR   Specific Gravity, Urine 1.027 1.005 - 1.030   pH 6.0 5.0 - 8.0   Glucose, UA NEG NEG mg/dL   Bilirubin Urine NEG NEG   Ketones, ur NEG NEG mg/dL   Hgb urine dipstick NEG NEG   Protein, ur NEG NEG mg/dL   Urobilinogen, UA 1 0.0 - 1.0 mg/dL   Nitrite NEG NEG   Leukocytes, UA NEG NEG  INR/PT  Result Value Ref Range   Prothrombin Time 13.4 11.6 - 15.2 seconds   INR 1.02 <1.50  Iron Binding Cap (TIBC)  Result Value Ref Range   Iron 49 42 - 145 ug/dL   UIBC 353 125 - 400 ug/dL   TIBC 402 250 - 470 ug/dL   %SAT 12 (L) 20 - 55 %  B12  Result Value Ref Range   Vitamin B-12 716 211 - 911 pg/mL  Folate  Result Value Ref Range   Folate 13.5 ng/mL     EKG/XRAY:   Primary read interpreted by Dr. Marin Comment at [facility].   ASSESSMENT/PLAN: Encounter Diagnoses  Name Primary?  Ralene Muskrat dermatitis with psoriasiform elements Yes  . Morbid obesity    Rx ketoconazole She cont to have knee pain secondary to weight --advise to take NSAIDs HA-will try  excedrin,advise to be mindful of NSAID use Her weight loss has not been going well, holding steady , advise to increase exercise, although hard to do since her knee hurts and also she has been out late since her daughter has cheerleading practice and is not cookign at home Fu in 1 month   Gross sideeffects, risk and benefits, and alternatives of medications d/w patient. Patient is aware that all medications have potential sideeffects and we are unable to predict every sideeffect or drug-drug interaction that may occur.  , [place], DO 01/16/2015 10:13 AM

## 2015-01-22 ENCOUNTER — Telehealth: Payer: Self-pay

## 2015-01-22 NOTE — Telephone Encounter (Signed)
Patient called in again tonight wanting to speak to someone about her knee pain,told her that no one had addressed her previous telephone message, patient then asked what time we closed because she was going to come in and be seen since she was in so much pain. I told her that would be fine and that we closed at 830pm(her call was at 7pm) patient then stated she doesn't now have time to RTC due to the fact that she is a single parent of two small kids and wants someone to call her back and tell her what to do about her knee pain.  Her return phone number is [phone]

## 2015-01-22 NOTE — Telephone Encounter (Signed)
Pt saw Thao P Le, DO at 01/12/2015 11:36 AM for knee and back pain. Pt states that she is still in pain, and would like to speak with either Dr. Conley RollsLe or her nurse.

## 2015-01-23 NOTE — Telephone Encounter (Signed)
Spoke to pt who reported that she has been taking 800 mg of OTC ibuprofen Q8hrs and it has not been effective for pain. She also doesn't want to continue taking it at this high dose d/t possible damaging SEs. She stated that she has been given hydrocodone and oxycodone in the past for pain which have helped. She would rather wait to go to ortho since she is pursuing weight loss surgery. Pt states that her knees and lower back just ache constantly. I asked her what other NSAIDS she has tried and she reported that she has also been Rxd naproxen (and taken Aleve) but they were not helpful. She has not tried prednisone before nor Celebrex. Dr Conley RollsLe, please advise.

## 2015-01-23 NOTE — Telephone Encounter (Signed)
See duplicate message, also 01/22/15.

## 2015-01-24 ENCOUNTER — Other Ambulatory Visit: Payer: Self-pay | Admitting: Family Medicine

## 2015-01-24 MED ORDER — HYDROCODONE-ACETAMINOPHEN 5-325 MG PO TABS: 1.0000 | ORAL_TABLET | Freq: Four times a day (QID) | ORAL | Status: DC | PRN

## 2015-02-03 ENCOUNTER — Encounter: Payer: 59 | Attending: Surgery | Admitting: Dietician

## 2015-02-03 ENCOUNTER — Encounter: Payer: Self-pay | Admitting: Dietician

## 2015-02-03 DIAGNOSIS — Z713 Dietary counseling and surveillance: Secondary | ICD-10-CM | POA: Insufficient documentation

## 2015-02-03 DIAGNOSIS — Z6841 Body Mass Index (BMI) 40.0 and over, adult: Secondary | ICD-10-CM | POA: Insufficient documentation

## 2015-02-03 NOTE — Patient Instructions (Signed)

## 2015-02-03 NOTE — Progress Notes (Signed)
  Pre-Op Assessment Visit:  Pre-Operative Gastric sleeve Surgery  Medical Nutrition Therapy:  Appt start time: 1115   End time:  1200.  Patient was seen on 02/03/2015 for Pre-Operative Nutrition Assessment. Assessment and [facility] of approval [facility] on 02/03/2015.   Preferred Learning Style:   No preference indicated   Learning Readiness:   Ready  Handouts given during visit include:  Pre-Op Goals Bariatric Surgery Protein Shakes   During the appointment today the following Pre-Op Goals were reviewed with the patient: Maintain or lose weight as instructed by your surgeon Make healthy food choices Begin to limit portion sizes Limited concentrated sugars and fried foods Keep fat/sugar in the single digits per serving on   food labels Practice CHEWING your food  (aim for 30 chews per bite or until applesauce consistency) Practice not drinking 15 minutes before, during, and 30 minutes after each meal/snack Avoid all carbonated beverages  Avoid/limit caffeinated beverages  Avoid all sugar-sweetened beverages Consume 3 meals per day; eat every 3-5 hours Make a list of non-food related activities Aim for 64-100 ounces of FLUID daily  Aim for at least 60-80 grams of PROTEIN daily Look for a liquid protein source that contain ?15 g protein and ?5 g carbohydrate  (ex: shakes, drinks, shots)  Patient-Centered Goals: -Overall health -Reduced medications -Healthy for kids -Amusement park rides -Prevention of further health problems  Scale of 1-10: confidence (10) /importance (10)  Demonstrated degree of understanding via:  Teach Back  Teaching Method Utilized:  Visual Auditory Hands on  Barriers to learning/adherence to lifestyle change: none  Patient to call the [facility] to enroll in Pre-Op and Post-Op Nutrition Education when surgery date is scheduled.

## 2015-02-22 ENCOUNTER — Encounter (HOSPITAL_COMMUNITY)
Admission: RE | Disposition: A | Discharge status: Home / Self Care | Payer: Self-pay | Source: Ambulatory Visit | Attending: Surgery

## 2015-02-22 ENCOUNTER — Ambulatory Visit (HOSPITAL_COMMUNITY)
Admission: RE | Admit: 2015-02-22 | Discharge: 2015-02-22 | Disposition: A | Discharge status: Home / Self Care | Payer: 59 | Source: Ambulatory Visit | Attending: Surgery | Admitting: Surgery

## 2015-02-22 HISTORY — PX: BREATH TEK H PYLORI: SHX5422

## 2015-02-22 SURGERY — BREATH TEST, FOR HELICOBACTER PYLORI

## 2015-02-22 NOTE — Progress Notes (Signed)
   02/22/15 0946  BREATH TEK ASSESSMENT  Referring MD Dr Luretha MurphyMatthew MArtin  Time of Last PO Intake 2300  Baseline Breath At: 0818  Pranactin Given At: 0819  Post-Dose Breath At: 0834  Sample 1 4.1%  Sample 2 3.4%  Test Negative

## 2015-02-23 ENCOUNTER — Encounter: Payer: Self-pay | Admitting: Family Medicine

## 2015-02-23 ENCOUNTER — Ambulatory Visit (INDEPENDENT_AMBULATORY_CARE_PROVIDER_SITE_OTHER): Payer: 59 | Admitting: Family Medicine

## 2015-02-23 DIAGNOSIS — M1990 Unspecified osteoarthritis, unspecified site: Secondary | ICD-10-CM

## 2015-02-23 DIAGNOSIS — I10 Essential (primary) hypertension: Secondary | ICD-10-CM | POA: Diagnosis not present

## 2015-02-23 DIAGNOSIS — J302 Other seasonal allergic rhinitis: Secondary | ICD-10-CM

## 2015-02-23 MED ORDER — HYDROCODONE-ACETAMINOPHEN 5-325 MG PO TABS: 1.0000 | ORAL_TABLET | Freq: Four times a day (QID) | ORAL | Status: DC | PRN

## 2015-02-23 NOTE — Progress Notes (Signed)
Chief Complaint:  Chief Complaint  Patient presents with  . Weight Loss  . Nasal Congestion    HPI: Amber Daniels is a 34 y.o. female who is here for her   1. monthly evaluation for weightloss surgery. She is doing well. Her weight has been stable. She has lost about 5 pounds in the last month. She had a H pylori test yesterday and was negative. Her labs were done and were pretty unremarkable. She is scheduled to possibly have the surgery at the end of May if all goes well. 2. About 2-3 weeks ago when we had the cold drains, her knees were hurting her severely. She called in for pain medication. This helped. She was insignificant pain, enough to disable her from going to work one day. Her knees did not get swollen or red. She did not have any fevers or chills. She has no history of gout. It took her a couple days to have relief. The pain medications helped. She's had prior x-rays which showed degenerative changes. Neither any numbness weakness or tingling. 3. She has some nasal congestion from allergies. Also started recently. She is already on Allegra. She has not tried any nasal sprays such as Flonase or Nasacort. She does have a history of asthma. Nights wheezing or shortness of breath.  Wt Readings from Last 3 Encounters:  02/23/15 355 lb 3.2 oz (161.118 kg)  02/03/15 356 lb 3.2 oz (161.571 kg)  01/12/15 359 lb (162.841 kg)     Past Medical History  Diagnosis Date  . Asthma   . Postpartum care following cesarean delivery (12/2) 11/01/2013  . Morbid obesity   . Allergy   . Heart murmur   . Hypertension   . Sleep apnea     not on CPAP, dx 01/2013 with Outpatietn overnight study at home.    Past Surgical History  Procedure Laterality Date  . Cesarean section    . Cesarean section N/A 11/01/2013    Procedure: REPEAT CESAREAN SECTION;  Surgeon: Marvene Staff, MD;  Location: [facility];  Service: Obstetrics;  Laterality: N/A;  EDD: 11/04/13   History   Social  History  . Marital Status: Single    Spouse Name: N/A  . Number of Children: N/A  . Years of Education: N/A   Social History Main Topics  . Smoking status: Never Smoker   . Smokeless tobacco: Not on file  . Alcohol Use: 4.2 oz/week    7 Glasses of wine per week     Comment: 0 while pregnant  . Drug Use: No  . Sexual Activity: Yes    Birth Control/ Protection: IUD   Other Topics Concern  . None   Social History Narrative   Caffeine none.  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     Family History  Problem Relation Age of Onset  . Diabetes Mother   . Hypertension Mother   . Diabetes Father   . Hypertension Father   . Hypertension Brother   . Hypertension Maternal Grandmother   . Hypertension Maternal Grandfather    Allergies  Allergen Reactions  . Other     Seasonal and enviromental allergies (dust)   Prior to Admission medications   Medication Sig Start Date End Date Taking? Authorizing Provider  acetaminophen (TYLENOL) 500 MG tablet Take 1,500 mg by mouth every 6 (six) hours as needed. For pain.   Yes Historical Provider, MD  albuterol (PROVENTIL HFA;VENTOLIN HFA) 108 (90 BASE) MCG/ACT  inhaler Inhale 2 puffs into the lungs every 4 (four) hours as needed for wheezing or shortness of breath.   Yes Historical Provider, MD  beclomethasone (QVAR) 40 MCG/ACT inhaler Inhale 2 puffs into the lungs daily.   Yes Historical Provider, MD  chlorthalidone (HYGROTON) 25 MG tablet Take 1 tablet (25 mg total) by mouth daily. 09/11/14  Yes Lyriq Finerty P Annjeanette Sarwar, DO  cyclobenzaprine (FLEXERIL) 5 MG tablet Take 1 tablet (5 mg total) by mouth 3 (three) times daily as needed for muscle spasms. 10/20/14  Yes Trachelle Low P Endy Easterly, DO  fexofenadine (ALLEGRA) 60 MG tablet Take 60 mg by mouth daily.   Yes Historical Provider, MD  FLUOCINOLONE ACETONIDE SCALP 0.01 % OIL Use 1 oz on scalp and work through scalp for 5 min daily  and then thoroughly rinse with water 10/20/14  Yes Shoaib Siefker P Nasier Thumm, DO    HYDROcodone-acetaminophen (NORCO) 5-325 MG per tablet Take 1 tablet by mouth every 6 (six) hours as needed for moderate pain. Do not take extra tylenol with this, may cause constipation,take stool softener 01/24/15  Yes Raye Slyter P Herny Scurlock, DO  ibuprofen (ADVIL,MOTRIN) 600 MG tablet Take 1 tablet (600 mg total) by mouth every 6 (six) hours as needed for mild pain. 11/04/13  Yes Gustavo Lah, NP  ketoconazole (NIZORAL) 2 % shampoo Apply 1 application topically 2 (two) times a week. Lather affected eareas, leave on for 5 min then rinse. 01/12/15  Yes Bhumi Godbey P Kamon Fahr, DO  labetalol (NORMODYNE) 100 MG tablet Take 1 tablet (100 mg total) by mouth 2 (two) times daily. 09/11/14  Yes Lareta Bruneau P Dimetrius Montfort, DO  montelukast (SINGULAIR) 10 MG tablet Take 10 mg by mouth at bedtime.   Yes Historical Provider, MD  NOREL AD 4-10-325 MG TABS Take 1 tablet by mouth 4 (four) times daily. 12/07/14  Yes Historical Provider, MD  OVER THE COUNTER MEDICATION Norel AD Takes 4 times daily prn allergies   Yes Historical Provider, MD  ranitidine (ZANTAC) 150 MG tablet Take 1 tablet (150 mg total) by mouth 2 (two) times daily as needed for heartburn. 11/17/14  Yes Tereasa Coop, PA-C     ROS: The patient denies fevers, chills, night sweats, unintentional weight loss, chest pain, palpitations, wheezing, dyspnea on exertion, nausea, vomiting, abdominal pain, dysuria, hematuria, melena, numbness, weakness, or tingling.   All other systems have been reviewed and were otherwise negative with the exception of those mentioned in the HPI and as above.    PHYSICAL EXAM: Filed Vitals:   02/23/15 1104  BP: 122/67  Pulse: 68  Temp: 98 F (36.7 C)  Resp: 16   Filed Vitals:   02/23/15 1104  Height: 5' 3.5" (1.613 m)  Weight: 355 lb 3.2 oz (161.118 kg)   Body mass index is 61.93 kg/(m^2).  General: Alert, no acute distress, obviously obese African-American female HEENT:  Normocephalic, atraumatic, oropharynx patent. EOMI, PERRLA Cardiovascular:  Regular  rate and rhythm, no rubs murmurs or gallops.  No Carotid bruits, radial pulse intact. No pedal edema.  Respiratory: Clear to auscultation bilaterally.  No wheezes, rales, or rhonchi.  No cyanosis, no use of accessory musculature GI: No organomegaly, abdomen is soft and non-tender, positive bowel sounds.  No masses. Skin: Seborrheic dermatitis, better than it was the last time I saw her. Neurologic: Facial musculature symmetric. Psychiatric: Patient is appropriate throughout our interaction. Lymphatic: No cervical lymphadenopathy Musculoskeletal: Gait intact.   LABS: Results for orders placed or performed in visit on 11/16/14  CBC  Result Value  Ref Range   WBC 5.5 4.0 - 10.5 K/uL   RBC 4.70 3.87 - 5.11 MIL/uL   Hemoglobin 12.1 12.0 - 15.0 g/dL   HCT 37.8 36.0 - 46.0 %   MCV 80.4 78.0 - 100.0 fL   MCH 25.7 (L) 26.0 - 34.0 pg   MCHC 32.0 30.0 - 36.0 g/dL   RDW 16.4 (H) 11.5 - 15.5 %   Platelets 276 150 - 400 K/uL   MPV 10.3 9.4 - 12.4 fL  Comp Met (CMET)  Result Value Ref Range   Sodium 142 135 - 145 mEq/L   Potassium 3.8 3.5 - 5.3 mEq/L   Chloride 106 96 - 112 mEq/L   CO2 26 19 - 32 mEq/L   Glucose, Bld 107 (H) 70 - 99 mg/dL   BUN 9 6 - 23 mg/dL   Creat 0.59 0.50 - 1.10 mg/dL   Total Bilirubin 0.5 0.2 - 1.2 mg/dL   Alkaline Phosphatase 84 39 - 117 U/L   AST 21 0 - 37 U/L   ALT 38 (H) 0 - 35 U/L   Total Protein 7.1 6.0 - 8.3 g/dL   Albumin 4.1 3.5 - 5.2 g/dL   Calcium 9.0 8.4 - 10.5 mg/dL  TSH  Result Value Ref Range   TSH 1.526 0.350 - 4.500 uIU/mL  T4  Result Value Ref Range   T4, Total 8.5 4.5 - 12.0 ug/dL  HgB A1c  Result Value Ref Range   Hgb A1c MFr Bld 5.8 (H) <5.7 %   Mean Plasma Glucose 120 (H) <117 mg/dL  Pregnancy, urine  Result Value Ref Range   Preg Test, Ur NEG   Lipid Profile  Result Value Ref Range   Cholesterol 117 0 - 200 mg/dL   Triglycerides 65 <150 mg/dL   HDL 43 >39 mg/dL   Total CHOL/HDL Ratio 2.7 Ratio   VLDL 13 0 - 40 mg/dL   LDL  Cholesterol 61 0 - 99 mg/dL  Urinalysis  Result Value Ref Range   Color, Urine YELLOW YELLOW   APPearance CLEAR CLEAR   Specific Gravity, Urine 1.027 1.005 - 1.030   pH 6.0 5.0 - 8.0   Glucose, UA NEG NEG mg/dL   Bilirubin Urine NEG NEG   Ketones, ur NEG NEG mg/dL   Hgb urine dipstick NEG NEG   Protein, ur NEG NEG mg/dL   Urobilinogen, UA 1 0.0 - 1.0 mg/dL   Nitrite NEG NEG   Leukocytes, UA NEG NEG  INR/PT  Result Value Ref Range   Prothrombin Time 13.4 11.6 - 15.2 seconds   INR 1.02 <1.50  Iron Binding Cap (TIBC)  Result Value Ref Range   Iron 49 42 - 145 ug/dL   UIBC 353 125 - 400 ug/dL   TIBC 402 250 - 470 ug/dL   %SAT 12 (L) 20 - 55 %  B12  Result Value Ref Range   Vitamin B-12 716 211 - 911 pg/mL  Folate  Result Value Ref Range   Folate 13.5 ng/mL     EKG/XRAY:   Primary read interpreted by Dr. Marin Comment at [facility].   ASSESSMENT/PLAN: Encounter Diagnoses  Name Primary?  . Morbid obesity Yes  . Arthritis   . Seasonal allergies   . Essential hypertension    This is a pleasant 34 year old morbidly obese African-American female with a past medical history of hypertension, asthma, allergies please see her for her monthly checkup for a upcoming weight loss surgery. She is doing well. She has had a  5 pound weight loss in the last month. I have refilled her Norco for bilateral osteoarthritis knee pain, I'm hoping that her knee pain will be improved when she has bariatric weight loss surgery and is able to lose some of her weight. Advised to use over-the-counter Nasacort for nasal congestion/seasonal allergies/allergic rhinitis. Consider Singulair if she continues to have worsening symptoms for allergies and asthma. Follow-up in one month  Gross sideeffects, risk and benefits, and alternatives of medications d/w patient. Patient is aware that all medications have potential sideeffects and we are unable to predict every sideeffect or drug-drug interaction that may occur.  Naomee Nowland,  [place], DO 02/23/2015 1:22 PM

## 2015-02-26 ENCOUNTER — Encounter (HOSPITAL_COMMUNITY): Payer: Self-pay | Admitting: Surgery

## 2015-03-13 ENCOUNTER — Encounter: Payer: Self-pay | Admitting: Physician Assistant

## 2015-03-13 ENCOUNTER — Ambulatory Visit (INDEPENDENT_AMBULATORY_CARE_PROVIDER_SITE_OTHER): Payer: 59 | Admitting: Physician Assistant

## 2015-03-13 DIAGNOSIS — J302 Other seasonal allergic rhinitis: Secondary | ICD-10-CM

## 2015-03-13 DIAGNOSIS — R059 Cough, unspecified: Secondary | ICD-10-CM

## 2015-03-13 DIAGNOSIS — I159 Secondary hypertension, unspecified: Secondary | ICD-10-CM

## 2015-03-13 DIAGNOSIS — R05 Cough: Secondary | ICD-10-CM

## 2015-03-13 DIAGNOSIS — Z862 Personal history of diseases of the blood and blood-forming organs and certain disorders involving the immune mechanism: Secondary | ICD-10-CM

## 2015-03-13 DIAGNOSIS — K219 Gastro-esophageal reflux disease without esophagitis: Secondary | ICD-10-CM

## 2015-03-13 LAB — CBC WITH DIFFERENTIAL/PLATELET
BASOS ABS: 0 10*3/uL (ref 0.0–0.1)
Basophils Relative: 0 % (ref 0–1)
EOS PCT: 1 % (ref 0–5)
Eosinophils Absolute: 0.1 10*3/uL (ref 0.0–0.7)
HEMATOCRIT: 36.9 % (ref 36.0–46.0)
HEMOGLOBIN: 12 g/dL (ref 12.0–15.0)
LYMPHS ABS: 1.6 10*3/uL (ref 0.7–4.0)
Lymphocytes Relative: 22 % (ref 12–46)
MCH: 25.1 pg — AB (ref 26.0–34.0)
MCHC: 32.5 g/dL (ref 30.0–36.0)
MCV: 77 fL — ABNORMAL LOW (ref 78.0–100.0)
MONO ABS: 0.6 10*3/uL (ref 0.1–1.0)
MPV: 9.5 fL (ref 8.6–12.4)
Monocytes Relative: 9 % (ref 3–12)
Neutro Abs: 4.8 10*3/uL (ref 1.7–7.7)
Neutrophils Relative %: 68 % (ref 43–77)
Platelets: 323 10*3/uL (ref 150–400)
RBC: 4.79 MIL/uL (ref 3.87–5.11)
RDW: 16.2 % — ABNORMAL HIGH (ref 11.5–15.5)
WBC: 7.1 10*3/uL (ref 4.0–10.5)

## 2015-03-13 MED ORDER — CETIRIZINE HCL 10 MG PO TABS: 10.0000 mg | ORAL_TABLET | Freq: Every day | ORAL | Status: DC

## 2015-03-13 MED ORDER — TRIAMCINOLONE ACETONIDE 55 MCG/ACT NA AERO: 2.0000 | INHALATION_SPRAY | Freq: Every day | NASAL | Status: DC

## 2015-03-13 MED ORDER — MONTELUKAST SODIUM 10 MG PO TABS: 10.0000 mg | ORAL_TABLET | Freq: Every day | ORAL | Status: DC

## 2015-03-13 NOTE — Progress Notes (Signed)
03/13/2015 at 1:44 PM  Amber Daniels / DOB: 01/19/1981 / MRN: [id number]  The patient has Morbid obesity; HTN (hypertension); Esophageal reflux; Bilateral edema of lower extremity; Obesity hypoventilation syndrome; Snoring; and Asthma with acute exacerbation on her problem list.  HPI  Amber Daniels is a 34 y.o. well appearing obese female here today for a follow up of her cough.  She reports the cough has been going on for more than a month now.  Exposure to dust and freshly cut grass makes the cough worse.  She also complains of mild eye itching as well as palatal itching.  She denies sore throat, nausea, belly pain, and changes in urination and defecation.  She has tried OTC cough suppressants without relief of her symptoms.  She does not take a nasal corticosteroid or 2nd generation antihistamine, nor has she tried these recently.  She has a history of GERD and has been taking Zantac recently with good relief of her symptoms.  She has been giving blood and is often rejected because her blood is "too low."   She continues to lose weight in preparation for a gastric weight loss surgery which is scheduled to occur in approximately one month.  She reports going to the sleep doctor referral and was told that she did not have sleep apnea.   She  has a past medical history of Asthma; Postpartum care following cesarean delivery (12/2) (11/01/2013); Morbid obesity; Allergy; Heart murmur; Hypertension; and Sleep apnea.    She has a current medication list which includes the following prescription(s): acetaminophen, albuterol, beclomethasone, chlorthalidone, cyclobenzaprine, fexofenadine, fluocinolone acetonide scalp, hydrocodone-acetaminophen, ibuprofen, ketoconazole, labetalol, montelukast, norel ad, OVER THE COUNTER MEDICATION, and ranitidine.  Amber Daniels is allergic to other. She  reports that she has never smoked. She does not have any smokeless tobacco history on file. She reports that she  drinks about 4.2 oz of alcohol per week. She reports that she does not use illicit drugs. She  reports that she currently engages in sexual activity. She reports using the following method of birth control/protection: IUD. The patient  has past surgical history that includes Cesarean section; Cesarean section (N/A, 11/01/2013); and Breath tek h pylori (N/A, 02/22/2015).  Her family history includes Diabetes in her father and mother; Hypertension in her brother, father, maternal grandfather, maternal grandmother, and mother.  Review of Systems  Constitutional: Negative for fever.  Respiratory: Positive for cough. Negative for hemoptysis, sputum production, shortness of breath and wheezing.   Skin: Negative for rash.  Neurological: Negative for dizziness.  Endo/Heme/Allergies: Positive for environmental allergies.    OBJECTIVE  Her  height is 5' 3.5" (1.613 m) and weight is 353 lb (160.12 kg). Her oral temperature is 98.2 F (36.8 C). Her blood pressure is 119/81 and her pulse is 80. Her respiration is 16 and oxygen saturation is 99%.  The patient's body mass index is 61.54 kg/(m^2).  Physical Exam  Constitutional: She is oriented to person, place, and time. No distress.  She is obese  HENT:  Mouth/Throat: No oropharyngeal exudate.  Eyes: Pupils are equal, round, and reactive to light. No scleral icterus.  Neck: Normal range of motion.  Cardiovascular: Normal rate, regular rhythm and normal heart sounds.   Respiratory: Effort normal and breath sounds normal.  GI: Soft. Bowel sounds are normal.  Neurological: She is alert and oriented to person, place, and time. She has normal reflexes.  Skin: Skin is warm and dry. She is not diaphoretic.  Psychiatric: She has a  normal mood and affect.    No results found for this or any previous visit (from the past 24 hour(s)).  Vitals - 1 value per visit 02/23/2015 02/03/2015 01/12/2015 12/18/2014 12/15/2014  Weight (lb) 355.2 356.2 359 358.6 358  Height 5'  3.5"  5' 3.75"    BMI 61.93 63.11 62.13 63.54 63.43     ASSESSMENT & PLAN  [name] was seen today for follow up weight loss and cough.  Diagnoses and all orders for this visit:  Morbid obesity: She continues to lose weight, and bariatric surgery is scheduled in roughly 1 month.  Secondary hypertension, unspecified: Well controlled. Continue current regimen.   Gastroesophageal reflux disease, esophagitis presence not specified: Patient not complaining of symptoms at this time. She is to continue H2 blocker for now.    Seasonal allergies: Most likely driving the cough.  Will treat for allergies more aggresively. Should this fail consider PPI for the possibility of LPR not controlled with H2 blocker administration.     Orders: -     cetirizine (ZYRTEC) 10 MG tablet; Take 1 tablet (10 mg total) by mouth daily. -     triamcinolone (NASACORT ALLERGY 24HR) 55 MCG/ACT AERO nasal inhaler; Place 2 sprays into the nose daily.  Cough: Managed with problem 3.   History of anemia: CBC with differential.      The patient was advised to call or come back to clinic if she does not see an improvement in symptoms, or worsens with the above plan.   Deliah Boston, MHS, PA-C [facility] 03/13/2015 1:44 PM

## 2015-04-04 ENCOUNTER — Ambulatory Visit (INDEPENDENT_AMBULATORY_CARE_PROVIDER_SITE_OTHER): Payer: 59 | Admitting: Pulmonary Disease

## 2015-04-04 ENCOUNTER — Encounter (INDEPENDENT_AMBULATORY_CARE_PROVIDER_SITE_OTHER): Payer: Self-pay

## 2015-04-04 ENCOUNTER — Encounter: Payer: Self-pay | Admitting: Pulmonary Disease

## 2015-04-04 VITALS — BP 120/64 | HR 98 | Ht 63.0 in | Wt 360.4 lb

## 2015-04-04 DIAGNOSIS — E662 Morbid (severe) obesity with alveolar hypoventilation: Secondary | ICD-10-CM | POA: Diagnosis not present

## 2015-04-04 DIAGNOSIS — J4521 Mild intermittent asthma with (acute) exacerbation: Secondary | ICD-10-CM

## 2015-04-04 DIAGNOSIS — R0602 Shortness of breath: Secondary | ICD-10-CM | POA: Diagnosis not present

## 2015-04-04 NOTE — Progress Notes (Signed)
Subjective:    Patient ID: Amber BorsShakeita Daniels, female    DOB: 08/09/1981, 34 y.o.   MRN: [id number]  HPI  PCP - Mervin Hackerry Le  Chief Complaint  Patient presents with  . Sleep Consult    Referred by Dr. Conley RollsLe: Having headaches when she wakes up, snoring, stop breathing during sleep.  wakes up fatigued.  Epworth Score: 438    34 year old was being evaluated for gastric sleeve surgery presents for eval of sleep-disordered breathing and asthma. She had a home sleep [facility] and was told that she had obstructive sleep apnea. She never got a CPAP around that time. She has gained about 20 pounds since then to her current weight of 360 pounds. She reports waking up with headaches about 4 times a month and occasional non-refreshing sleep Epworth sleepiness score is 8. Bedtime is anywhere between 10 PM to midnight, sleep latency is variable, she sleeps on her side with 2 pillows, wakes up 2-3 times every night to check up on her children-she has an 35-year-old and a 7311-month-old. She gets out of bed at 6:30 AM feeling tired with occasional dryness of mouth and headache. There is no history suggestive of cataplexy, sleep paralysis or parasomnias  PSG 12/2014-total sleep time 289 minutes, REM sleep 25 minutes, one third of this time was supine sleep, RDI was 4.8 events per hour, lowest desaturation was 87% with 1.3 minutes with a saturation less than 90%.   She reports asthma since 99-when she was 34 years old. She has mostly been asymptomatic since her last attack in 2006. She is maintained on a regimen of Qvar, Singulair and albuterol as needed. She takes Qvar only as needed. She also takes antihistaminics for allergies. She has not been formally allergy tested. She wonders if her asthma is now improved she denies nocturnal wheezing or frequent albuterol usage. Spirometry shows no evidence of airway obstruction-ratio of 85, FEV1 96%  Past Medical History  Diagnosis Date  . Asthma     . Postpartum care following cesarean delivery (12/2) 11/01/2013  . Morbid obesity   . Allergy   . Heart murmur   . Hypertension   . Sleep apnea     not on CPAP, dx 01/2013 with Outpatietn overnight study at home.    Past Surgical History  Procedure Laterality Date  . Cesarean section    . Cesarean section N/A 11/01/2013    Procedure: REPEAT CESAREAN SECTION;  Surgeon: Serita KyleSheronette A Cousins, MD;  Location: [facility];  Service: Obstetrics;  Laterality: N/A;  EDD: 11/04/13  . Breath tek h pylori N/A 02/22/2015    Procedure: BREATH TEK H PYLORI;  Surgeon: Luretha MurphyMatthew Martin, MD;  [facility];  Service: General;  Laterality: N/A;     Allergies  Allergen Reactions  . Other     Seasonal and enviromental allergies (dust)    History   Social History  . Marital Status: Single    Spouse Name: N/A  . Number of Children: N/A  . Years of Education: N/A   Occupational History  . Not on file.   Social History Main Topics  . Smoking status: Never Smoker   . Smokeless tobacco: Not on file  . Alcohol Use: 4.2 oz/week    7 Glasses of wine per week     Comment: 0 while pregnant  . Drug Use: No  . Sexual Activity: Yes    Birth Control/ Protection: IUD   Other Topics Concern  . Not  on file   Social History Narrative   Caffeine none.  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      Family History  Problem Relation Age of Onset  . Diabetes Mother   . Hypertension Mother   . Diabetes Father   . Hypertension Father   . Hypertension Brother   . Hypertension Maternal Grandmother   . Hypertension Maternal Grandfather      Review of Systems  Constitutional: Negative for fever, chills and unexpected weight change.  HENT: Negative for congestion, dental problem, ear pain, nosebleeds, postnasal drip, rhinorrhea, sinus pressure, sneezing, sore throat, trouble swallowing and voice change.   Eyes: Negative for visual disturbance.  Respiratory: Negative for cough, choking and  shortness of breath.   Cardiovascular: Negative for chest pain and leg swelling.  Gastrointestinal: Negative for vomiting, abdominal pain and diarrhea.  Genitourinary: Negative for difficulty urinating.  Musculoskeletal: Negative for arthralgias.  Skin: Negative for rash.  Neurological: Negative for tremors, syncope and headaches.  Hematological: Does not bruise/bleed easily.       Objective:   Physical Exam  Gen. Pleasant, obese, in no distress, normal affect ENT - no lesions, no post nasal drip, class 2-3 airway Neck: No JVD, no thyromegaly, no carotid bruits Lungs: no use of accessory muscles, no dullness to percussion, decreased without rales or rhonchi  Cardiovascular: Rhythm regular, heart sounds  normal, no murmurs or gallops, no peripheral edema Abdomen: soft and non-tender, no hepatosplenomegaly, BS normal. Musculoskeletal: No deformities, no cyanosis or clubbing Neuro:  alert, non focal, no tremors        Assessment & Plan:

## 2015-04-04 NOTE — Assessment & Plan Note (Signed)
Her polysomnogram in 12/2014 does not show significant obstructive sleep apnea. She does not seem to have significant nocturnal desaturation. I doubt the presents of obesity hypoventilation-since she does not have documented daytime hypercarbia. Serum bicarbonate is not quite high. As such, weight loss is the only therapy recommended at this time Not clear what causes the discrepancy between her to sleep studies-she does seem to have adequate supine and REM sleep on the overnight polysomnogram

## 2015-04-04 NOTE — Patient Instructions (Addendum)
Good luck with weight loss surgery You do not need CPAP therapy at this time  Lung function test appears ok OK to stay off qvar after allergy season - use albuterol 2 puffs as needed for wheezing

## 2015-04-04 NOTE — Assessment & Plan Note (Signed)
Her asthma appears to be mild intermittent at best. Perhaps this is all related to obesity I have asked her to stop the Qvar altogether Lung function appears very good today

## 2015-04-06 ENCOUNTER — Ambulatory Visit: Payer: 59 | Admitting: Family Medicine

## 2015-04-09 ENCOUNTER — Ambulatory Visit (INDEPENDENT_AMBULATORY_CARE_PROVIDER_SITE_OTHER): Payer: 59 | Admitting: Physician Assistant

## 2015-04-09 ENCOUNTER — Encounter: Payer: Self-pay | Admitting: Physician Assistant

## 2015-04-09 DIAGNOSIS — M25562 Pain in left knee: Secondary | ICD-10-CM | POA: Diagnosis not present

## 2015-04-09 DIAGNOSIS — M25561 Pain in right knee: Secondary | ICD-10-CM

## 2015-04-09 MED ORDER — IBUPROFEN 600 MG PO TABS: 600.0000 mg | ORAL_TABLET | Freq: Four times a day (QID) | ORAL | Status: DC | PRN

## 2015-04-09 NOTE — Progress Notes (Signed)
04/09/2015 at 3:56 PM  Amber BorsShakeita Kotowski / DOB: 05/02/1981 / MRN: [id number]  The patient has Morbid obesity; HTN (hypertension); Esophageal reflux; Bilateral edema of lower extremity; Obesity hypoventilation syndrome; Snoring; and Asthma with acute exacerbation on her problem list.  SUBJECTIVE  Chief complaint: Follow-up  Patient here for follow up of weight loss for a scheduled bariatric surgery that is pending.  Ms. Amber Daniels is to call the surgeons office tomorrow morning in order to schedule her appointment for surgery pending appointment. She will be seeing Dr. Luretha MurphyMatthew Martin [facility] for this [facility].   She reports her cough has gotten better with the H2 blocker, but was still having some coughing spells so she was moved to Nexium.  She complains of knee pain and would like a refill of her ibuprofen 600 mg prescription.   She  has a past medical history of Asthma; Postpartum care following cesarean delivery (12/2) (11/01/2013); Morbid obesity; Allergy; Heart murmur; Hypertension; and Sleep apnea.    Medications reviewed and updated by myself where necessary, and exist elsewhere in the encounter.   Ms. Amber Daniels is allergic to other. She  reports that she has never smoked. She does not have any smokeless tobacco history on file. She reports that she drinks about 4.2 oz of alcohol per week. She reports that she does not use illicit drugs. She  reports that she currently engages in sexual activity. She reports using the following method of birth control/protection: IUD. The patient  has past surgical history that includes Cesarean section; Cesarean section (N/A, 11/01/2013); and Breath tek h pylori (N/A, 02/22/2015).  Her family history includes Diabetes in her father and mother; Hypertension in her brother, father, maternal grandfather, maternal grandmother, and mother.  Review of Systems  Constitutional: Negative.   HENT: Negative.   Eyes: Negative.   Respiratory: Negative.     Gastrointestinal: Negative.   Genitourinary: Negative.   Musculoskeletal: Negative.   Skin: Negative.   Endo/Heme/Allergies: Negative.   Psychiatric/Behavioral: Negative.     OBJECTIVE  Her  height is 5\' 4"  (1.626 m) and weight is 357 lb 9.6 oz (162.206 kg). Her oral temperature is 98.6 F (37 C). Her blood pressure is 130/75 and her pulse is 94. Her respiration is 16 and oxygen saturation is 99%.  The patient's body mass index is 61.35 kg/(m^2).  Physical Exam  Constitutional: She is oriented to person, place, and time. She appears well-developed and well-nourished.  Cardiovascular: Normal rate.   Respiratory: Effort normal.  Musculoskeletal: Normal range of motion.  Neurological: She is alert and oriented to person, place, and time. No cranial nerve deficit. Coordination normal.  Skin: Skin is warm and dry.  Psychiatric: She has a normal mood and affect.    No results found for this or any previous visit (from the past 24 hour(s)).  ASSESSMENT & PLAN  [name] was seen today for follow-up.  Diagnoses and all orders for this visit:  Morbid obesity: Final pre op appointment with [facility].  Patient to proceed with bariatric surgery per the care of Dr. Luretha MurphyMatthew Martin. Spoke with Dr. Ermalene SearingMartin's office regarding the need for labs and they report nothing is needed at this time.  She is to follow up with this office at the direction of her surgeon.   Arthralgia of both knees: 2/2 to problem 2.   Orders: -     ibuprofen (ADVIL,MOTRIN) 600 MG tablet; Take 1 tablet (600 mg total) by mouth every 6 (six) hours as needed for  mild pain.   The patient was advised to call or come back to clinic if she does not see an improvement in symptoms, or worsens with the above plan.   Deliah BostonMichael Mosella Kasa, MHS, PA-C [facility] 04/09/2015 3:56 PM

## 2015-04-09 NOTE — Patient Instructions (Signed)
Vitals - 1 value per visit 04/09/2015 04/04/2015 03/13/2015 02/23/2015 02/03/2015  SYSTOLIC 130 120 [id number] 122   DIASTOLIC 75 64 81 67   Pulse 94 98 80 68   Temperature 98.6  98.2 98   Respirations 16  16 16    Weight (lb) 357.6 360.4 353 355.2 356.2  Height 5\' 4"  5\' 3"  5' 3.5" 5' 3.5" 5\' 3"   BMI 61.35 63.86 61.54 61.93 63.11   Vitals - 1 value per visit 01/12/2015 12/18/2014 12/15/2014 11/17/2014  SYSTOLIC 124 128 [id number] 146  DIASTOLIC 77 76 83 92  Pulse 78 89 73 100  Temperature 98.5  97.9 98.2  Respirations 16 15 16 16   Weight (lb) 359 358.6 358 367.8  Height 5' 3.75" 5\' 3"  5\' 3"  5' 3.25"  BMI 62.13 63.54 63.43 64.6   Vitals - 1 value per visit 10/20/2014  SYSTOLIC 123  DIASTOLIC 78  Pulse 84  Temperature 98.5  Respirations 18  Weight (lb) 374.4  Height 5' 3.5"  BMI 65.27

## 2015-05-14 ENCOUNTER — Ambulatory Visit: Payer: 59

## 2015-05-17 ENCOUNTER — Telehealth: Payer: Self-pay

## 2015-05-17 DIAGNOSIS — M1990 Unspecified osteoarthritis, unspecified site: Secondary | ICD-10-CM

## 2015-05-17 NOTE — Telephone Encounter (Signed)
Pt would like a refill on her HYDROcodone-acetaminophen (NORCO) 5-325 MG per tablet [[id number]]. Please advise at [phone]

## 2015-05-18 ENCOUNTER — Telehealth: Payer: Self-pay

## 2015-05-18 DIAGNOSIS — M1990 Unspecified osteoarthritis, unspecified site: Secondary | ICD-10-CM

## 2015-05-18 MED ORDER — HYDROCODONE-ACETAMINOPHEN 5-325 MG PO TABS: 1.0000 | ORAL_TABLET | Freq: Four times a day (QID) | ORAL | Status: DC | PRN

## 2015-05-18 NOTE — Telephone Encounter (Signed)
Pt called here looking for her HYDROcodone-acetaminophen (NORCO) 5-325 MG per tablet [[id number]] , I see it has been printed. I can not find anywhere. Please advise at [phone]

## 2015-05-18 NOTE — Telephone Encounter (Signed)
Filled #[phone] and printed at [facility] as I see Dr Conley Rolls did recently fill this script and looking at [facility] controlled substances database she has not had norco filled since 2/16.

## 2015-05-18 NOTE — Telephone Encounter (Signed)
Called by after hours answering service, called patient. No answer, LM , she can pick up her rx tomorrow since I was not in office to sign it.

## 2015-05-18 NOTE — Telephone Encounter (Signed)
I don't have this prescription.

## 2015-05-19 ENCOUNTER — Telehealth: Payer: Self-pay | Admitting: Radiology

## 2015-06-08 ENCOUNTER — Other Ambulatory Visit: Payer: Self-pay

## 2015-06-08 ENCOUNTER — Ambulatory Visit: Payer: Self-pay | Admitting: Surgery

## 2015-06-08 DIAGNOSIS — K2100 Gastro-esophageal reflux disease with esophagitis, without bleeding: Secondary | ICD-10-CM

## 2015-06-08 DIAGNOSIS — Z01818 Encounter for other preprocedural examination: Secondary | ICD-10-CM

## 2015-06-08 NOTE — H&P (Signed)
Chief Complaint:  Morbid obesity with BMI 64  History of Present Illness:  Amber Daniels is an 34 y.o. female initially seen in the office this past December for sleeve gastrectomy.  She had all of her questions answered and is ready for surgery.  Her ultrasound showed no gallstones.  UGI is pending.  She is ready for surgery.    Past Medical History  Diagnosis Date  . Asthma   . Postpartum care following cesarean delivery (12/2) 11/01/2013  . Morbid obesity   . Allergy   . Heart murmur   . Hypertension   . Sleep apnea     not on CPAP, dx 01/2013 with Outpatietn overnight study at home.     Past Surgical History  Procedure Laterality Date  . Cesarean section    . Cesarean section N/A 11/01/2013    Procedure: REPEAT CESAREAN SECTION;  Surgeon: Serita KyleSheronette A Cousins, MD;  Location: [facility];  Service: Obstetrics;  Laterality: N/A;  EDD: 11/04/13  . Breath tek h pylori N/A 02/22/2015    Procedure: BREATH TEK H PYLORI;  Surgeon: Luretha MurphyMatthew Aalaysia Liggins, MD;  [facility];  Service: General;  Laterality: N/A;    Current Outpatient Prescriptions  Medication Sig Dispense Refill  . acetaminophen (TYLENOL) 500 MG tablet Take 1,500 mg by mouth every 6 (six) hours as needed. For pain.    Marland Kitchen. albuterol (PROVENTIL HFA;VENTOLIN HFA) 108 (90 BASE) MCG/ACT inhaler Inhale 2 puffs into the lungs every 4 (four) hours as needed for wheezing or shortness of breath.    . beclomethasone (QVAR) 40 MCG/ACT inhaler Inhale 2 puffs into the lungs daily.    . cetirizine (ZYRTEC) 10 MG tablet Take 1 tablet (10 mg total) by mouth daily. 30 tablet 3  . chlorthalidone (HYGROTON) 25 MG tablet Take 1 tablet (25 mg total) by mouth daily. 30 tablet 5  . cyclobenzaprine (FLEXERIL) 5 MG tablet Take 1 tablet (5 mg total) by mouth 3 (three) times daily as needed for muscle spasms. 30 tablet 0  . fexofenadine (ALLEGRA) 60 MG tablet Take 60 mg by mouth daily.    Marland Kitchen. FLUOCINOLONE ACETONIDE SCALP 0.01 % OIL Use 1 oz on scalp and work  through scalp for 5 min daily  and then thoroughly rinse with water 118 mL 0  . HYDROcodone-acetaminophen (NORCO) 5-325 MG per tablet Take 1 tablet by mouth every 6 (six) hours as needed for moderate pain. Do not take extra tylenol with this, may cause constipation,take stool softener 30 tablet 0  . ibuprofen (ADVIL,MOTRIN) 600 MG tablet Take 1 tablet (600 mg total) by mouth every 6 (six) hours as needed for mild pain. 30 tablet 1  . ketoconazole (NIZORAL) 2 % shampoo Apply 1 application topically 2 (two) times a week. Lather affected eareas, leave on for 5 min then rinse. 120 mL 0  . labetalol (NORMODYNE) 100 MG tablet Take 1 tablet (100 mg total) by mouth 2 (two) times daily. 60 tablet 5  . montelukast (SINGULAIR) 10 MG tablet Take 1 tablet (10 mg total) by mouth at bedtime. 30 tablet 0  . NOREL AD 4-10-325 MG TABS Take 1 tablet by mouth 4 (four) times daily.  2  . triamcinolone (NASACORT ALLERGY 24HR) 55 MCG/ACT AERO nasal inhaler Place 2 sprays into the nose daily. 1 Inhaler 12   No current facility-administered medications for this visit.   Other Family History  Problem Relation Age of Onset  . Diabetes Mother   . Hypertension Mother   . Diabetes Father   .  Hypertension Father   . Hypertension Brother   . Hypertension Maternal Grandmother   . Hypertension Maternal Grandfather    Social History:   reports that she has never smoked. She does not have any smokeless tobacco history on file. She reports that she drinks about 4.2 oz of alcohol per week. She reports that she does not use illicit drugs.   REVIEW OF SYSTEMS : Negative except for see problem list  Physical Exam:   not currently breastfeeding. There is no weight on file to calculate BMI.  Gen:  WDWN AAF NAD  Neurological: Alert and oriented to person, place, and time. Motor and sensory function is grossly intact  Head: Normocephalic and atraumatic.  Eyes: Conjunctivae are normal. Pupils are equal, round, and reactive to  light. No scleral icterus.  Neck: Normal range of motion. Neck supple. No tracheal deviation or thyromegaly present.  Cardiovascular:  SR without murmurs or gallops.  No carotid bruits Breast:  Not examined Respiratory: Effort normal.  No respiratory distress. No chest wall tenderness. Breath sounds normal.  No wheezes, rales or rhonchi.  Abdomen:  Prior c section incisions;  No upper abdominal incisions GU:  Not examined Musculoskeletal: Normal range of motion. Extremities are nontender. No cyanosis, edema or clubbing noted Lymphadenopathy: No cervical, preauricular, postauricular or axillary adenopathy is present Skin: Skin is warm and dry. No rash noted. No diaphoresis. No erythema. No pallor. Pscyh: Normal mood and affect. Behavior is normal. Judgment and thought content normal.   LABORATORY RESULTS: No results found for this or any previous visit (from the past 48 hour(s)).   RADIOLOGY RESULTS: No results found.  Problem List: Patient Active Problem List   Diagnosis Date Noted  . Obesity hypoventilation syndrome 12/18/2014  . Snoring 12/18/2014  . Asthma with acute exacerbation 12/18/2014  . Morbid obesity 11/17/2014  . HTN (hypertension) 11/17/2014  . Esophageal reflux 11/17/2014  . Bilateral edema of lower extremity 11/17/2014    Assessment & Plan: For sleeve gastrectomy on July 25th.  She will have UGI before then since she has GERD.      Matt B. Daphine Deutscher, MD, [facility] [phone] beeper [phone]  06/08/2015 11:25 AM

## 2015-06-11 ENCOUNTER — Encounter: Payer: 59 | Attending: Surgery

## 2015-06-11 DIAGNOSIS — Z6841 Body Mass Index (BMI) 40.0 and over, adult: Secondary | ICD-10-CM | POA: Insufficient documentation

## 2015-06-11 DIAGNOSIS — Z01818 Encounter for other preprocedural examination: Secondary | ICD-10-CM | POA: Insufficient documentation

## 2015-06-11 DIAGNOSIS — Z713 Dietary counseling and surveillance: Secondary | ICD-10-CM | POA: Insufficient documentation

## 2015-06-12 ENCOUNTER — Encounter (HOSPITAL_COMMUNITY): Payer: Self-pay

## 2015-06-12 ENCOUNTER — Other Ambulatory Visit: Payer: Self-pay

## 2015-06-12 ENCOUNTER — Ambulatory Visit (HOSPITAL_COMMUNITY)
Admission: RE | Admit: 2015-06-12 | Discharge: 2015-06-12 | Disposition: A | Discharge status: Home / Self Care | Payer: 59 | Source: Ambulatory Visit | Attending: Surgery | Admitting: Surgery

## 2015-06-12 ENCOUNTER — Emergency Department (HOSPITAL_COMMUNITY): Payer: 59

## 2015-06-12 ENCOUNTER — Emergency Department (HOSPITAL_COMMUNITY)
Admission: EM | Admit: 2015-06-12 | Discharge: 2015-06-13 | Disposition: A | Discharge status: Home / Self Care | Payer: 59 | Attending: Emergency Medicine | Admitting: Emergency Medicine

## 2015-06-12 DIAGNOSIS — R079 Chest pain, unspecified: Secondary | ICD-10-CM | POA: Insufficient documentation

## 2015-06-12 DIAGNOSIS — I10 Essential (primary) hypertension: Secondary | ICD-10-CM | POA: Diagnosis not present

## 2015-06-12 DIAGNOSIS — B349 Viral infection, unspecified: Secondary | ICD-10-CM | POA: Diagnosis not present

## 2015-06-12 DIAGNOSIS — M549 Dorsalgia, unspecified: Secondary | ICD-10-CM | POA: Diagnosis not present

## 2015-06-12 DIAGNOSIS — G4730 Sleep apnea, unspecified: Secondary | ICD-10-CM | POA: Insufficient documentation

## 2015-06-12 DIAGNOSIS — R1110 Vomiting, unspecified: Secondary | ICD-10-CM | POA: Diagnosis present

## 2015-06-12 DIAGNOSIS — Z79899 Other long term (current) drug therapy: Secondary | ICD-10-CM | POA: Diagnosis not present

## 2015-06-12 DIAGNOSIS — R011 Cardiac murmur, unspecified: Secondary | ICD-10-CM | POA: Diagnosis not present

## 2015-06-12 DIAGNOSIS — Z8669 Personal history of other diseases of the nervous system and sense organs: Secondary | ICD-10-CM | POA: Diagnosis not present

## 2015-06-12 DIAGNOSIS — R000 Tachycardia, unspecified: Secondary | ICD-10-CM | POA: Diagnosis not present

## 2015-06-12 DIAGNOSIS — Z6841 Body Mass Index (BMI) 40.0 and over, adult: Secondary | ICD-10-CM | POA: Insufficient documentation

## 2015-06-12 DIAGNOSIS — K219 Gastro-esophageal reflux disease without esophagitis: Secondary | ICD-10-CM | POA: Diagnosis not present

## 2015-06-12 DIAGNOSIS — J45909 Unspecified asthma, uncomplicated: Secondary | ICD-10-CM | POA: Insufficient documentation

## 2015-06-12 DIAGNOSIS — Z7951 Long term (current) use of inhaled steroids: Secondary | ICD-10-CM | POA: Diagnosis not present

## 2015-06-12 LAB — URINE MICROSCOPIC-ADD ON

## 2015-06-12 LAB — CK: Total CK: 55 U/L (ref 38–234)

## 2015-06-12 LAB — URINALYSIS, ROUTINE W REFLEX MICROSCOPIC
BILIRUBIN URINE: NEGATIVE
GLUCOSE, UA: NEGATIVE mg/dL
KETONES UR: 15 mg/dL — AB
Nitrite: NEGATIVE
PROTEIN: NEGATIVE mg/dL
Specific Gravity, Urine: 1.018 (ref 1.005–1.030)
Urobilinogen, UA: 0.2 mg/dL (ref 0.0–1.0)
pH: 5.5 (ref 5.0–8.0)

## 2015-06-12 LAB — CBC
HCT: 35.6 % — ABNORMAL LOW (ref 36.0–46.0)
Hemoglobin: 11.2 g/dL — ABNORMAL LOW (ref 12.0–15.0)
MCH: 24.9 pg — AB (ref 26.0–34.0)
MCHC: 31.5 g/dL (ref 30.0–36.0)
MCV: 79.1 fL (ref 78.0–100.0)
Platelets: 181 10*3/uL (ref 150–400)
RBC: 4.5 MIL/uL (ref 3.87–5.11)
RDW: 16 % — ABNORMAL HIGH (ref 11.5–15.5)
WBC: 6 10*3/uL (ref 4.0–10.5)

## 2015-06-12 LAB — BASIC METABOLIC PANEL
Anion gap: 8 (ref 5–15)
BUN: 10 mg/dL (ref 6–20)
CO2: 24 mmol/L (ref 22–32)
Calcium: 8.8 mg/dL — ABNORMAL LOW (ref 8.9–10.3)
Chloride: 103 mmol/L (ref 101–111)
Creatinine, Ser: 0.72 mg/dL (ref 0.44–1.00)
GFR calc Af Amer: >60 mL/min (ref >60)
GLUCOSE: 113 mg/dL — AB (ref 65–99)
Potassium: 3.8 mmol/L (ref 3.5–5.1)
SODIUM: 135 mmol/L (ref 135–145)

## 2015-06-12 LAB — I-STAT TROPONIN, ED: TROPONIN I, POC: 0 ng/mL (ref 0.00–0.08)

## 2015-06-12 LAB — I-STAT CG4 LACTIC ACID, ED: Lactic Acid, Venous: 1.16 mmol/L (ref 0.5–2.0)

## 2015-06-12 MED ORDER — SODIUM CHLORIDE 0.9 % IV BOLUS (SEPSIS)
1000.0000 mL | Freq: Once | INTRAVENOUS | Status: AC
  Administered 2015-06-12: 1000 mL via INTRAVENOUS

## 2015-06-12 MED ORDER — KETOROLAC TROMETHAMINE 30 MG/ML IJ SOLN
30.0000 mg | Freq: Once | INTRAMUSCULAR | Status: AC
  Administered 2015-06-12: 30 mg via INTRAVENOUS
  Filled 2015-06-12: qty 1

## 2015-06-12 MED ORDER — HYDROMORPHONE HCL 1 MG/ML IJ SOLN
1.0000 mg | Freq: Once | INTRAMUSCULAR | Status: AC
  Administered 2015-06-12: 1 mg via INTRAVENOUS
  Filled 2015-06-12: qty 1

## 2015-06-12 MED ORDER — ONDANSETRON HCL 4 MG/2ML IJ SOLN
4.0000 mg | Freq: Once | INTRAMUSCULAR | Status: AC
  Administered 2015-06-12: 4 mg via INTRAVENOUS
  Filled 2015-06-12: qty 2

## 2015-06-12 NOTE — ED Notes (Addendum)
Onset 6pm while driving home from work pt started having body aches, headache, viomiting x 5, chills, left chest pressure.  Pt had uppper GI with KUB  [facility]  She is having surgery 7-25 gastric sleeve.

## 2015-06-12 NOTE — ED Provider Notes (Signed)
CSN: [id number]     Arrival date & time 06/12/15  2006 History   First MD [id number] Contact with Patient 06/12/15 2052     Chief Complaint  Patient presents with  . Back Pain  . Emesis  . Chest Pain     (Consider location/radiation/quality/duration/timing/severity/associated sxs/prior Treatment) HPI Comments: The patient is a morbidly obese 34 year old female, she has a history of morbid obesity, she is having to undergo a surgical procedure on July 25 to have a gastric sleeve placed for her obesity. [facility] getting a barium swallow study to evaluate her upper GI tract, this was read as unremarkable, while she was driving home from work the patient states that she had acute onset of body aches, headache, nausea, vomiting, chills and pressure in the left chest. She also has some low back pain and states that her whole body hurts including arms legs and lower back. She denies dysuria, diarrhea, has had no cough, denies sore throat. She denies any rashes or tick bites, she has not been exposed to anybody that has been sick outside of being in the hospital today for a study. Her appetite has been normal, her activity level has been normal, she functions at work without difficulty.  Patient is a 34 y.o. female presenting with back pain, vomiting, and chest pain. The history is provided by the patient.  Back Pain Associated symptoms: chest pain   Emesis Chest Pain Associated symptoms: back pain and vomiting     Past Medical History  Diagnosis Date  . Asthma   . Postpartum care following cesarean delivery (12/2) 11/01/2013  . Morbid obesity   . Allergy   . Heart murmur   . Hypertension   . Sleep apnea     not on CPAP, dx 01/2013 with Outpatietn overnight study at home.    Past Surgical History  Procedure Laterality Date  . Cesarean section    . Cesarean section N/A 11/01/2013    Procedure: REPEAT CESAREAN SECTION;  Surgeon: Serita KyleSheronette A Cousins, MD;   Location: [facility];  Service: Obstetrics;  Laterality: N/A;  EDD: 11/04/13  . Breath tek h pylori N/A 02/22/2015    Procedure: BREATH TEK H PYLORI;  Surgeon: Luretha MurphyMatthew Martin, MD;  [facility];  Service: General;  Laterality: N/A;   Family History  Problem Relation Age of Onset  . Diabetes Mother   . Hypertension Mother   . Diabetes Father   . Hypertension Father   . Hypertension Brother   . Hypertension Maternal Grandmother   . Hypertension Maternal Grandfather    History  Substance Use Topics  . Smoking status: Never Smoker   . Smokeless tobacco: Not on file  . Alcohol Use: 4.2 oz/week    7 Glasses of wine per week     Comment: 0 while pregnant   OB History    Gravida Para Term Preterm AB TAB SAB Ectopic Multiple Living   2 2 2       2      Review of Systems  Cardiovascular: Positive for chest pain.  Gastrointestinal: Positive for vomiting.  Musculoskeletal: Positive for back pain.  All other systems reviewed and are negative.     Allergies  Other  Home Medications   Prior to Admission medications   Medication Sig Start Date End Date Taking? Authorizing Provider  acetaminophen (TYLENOL) 500 MG tablet Take 1,500 mg by mouth every 6 (six) hours as needed. For pain.   Yes Historical  Provider, MD  albuterol (PROVENTIL HFA;VENTOLIN HFA) 108 (90 BASE) MCG/ACT inhaler Inhale 2 puffs into the lungs every 4 (four) hours as needed for wheezing or shortness of breath.   Yes Historical Provider, MD  beclomethasone (QVAR) 40 MCG/ACT inhaler Inhale 2 puffs into the lungs daily.   Yes Historical Provider, MD  cetirizine (ZYRTEC) 10 MG tablet Take 1 tablet (10 mg total) by mouth daily. 03/13/15  Yes Ofilia Neas, PA-C  chlorthalidone (HYGROTON) 25 MG tablet Take 1 tablet (25 mg total) by mouth daily. 09/11/14  Yes Thao P Le, DO  cyclobenzaprine (FLEXERIL) 5 MG tablet Take 1 tablet (5 mg total) by mouth 3 (three) times daily as needed for muscle spasms. 10/20/14  Yes Thao P Le,  DO  fexofenadine (ALLEGRA) 60 MG tablet Take 60 mg by mouth daily.   Yes Historical Provider, MD  FLUOCINOLONE ACETONIDE SCALP 0.01 % OIL Use 1 oz on scalp and work through scalp for 5 min daily  and then thoroughly rinse with water 10/20/14  Yes Thao P Le, DO  ibuprofen (ADVIL,MOTRIN) 600 MG tablet Take 1 tablet (600 mg total) by mouth every 6 (six) hours as needed for mild pain. 04/09/15  Yes Ofilia Neas, PA-C  ketoconazole (NIZORAL) 2 % shampoo Apply 1 application topically 2 (two) times a week. Lather affected eareas, leave on for 5 min then rinse. 01/12/15  Yes Thao P Le, DO  labetalol (NORMODYNE) 100 MG tablet Take 1 tablet (100 mg total) by mouth 2 (two) times daily. 09/11/14  Yes Thao P Le, DO  montelukast (SINGULAIR) 10 MG tablet Take 1 tablet (10 mg total) by mouth at bedtime. 03/13/15  Yes Ofilia Neas, PA-C  NOREL AD 4-10-325 MG TABS Take 1 tablet by mouth 4 (four) times daily. 12/07/14  Yes Historical Provider, MD  triamcinolone (NASACORT ALLERGY 24HR) 55 MCG/ACT AERO nasal inhaler Place 2 sprays into the nose daily. 03/13/15  Yes Ofilia Neas, PA-C  HYDROcodone-acetaminophen (NORCO/VICODIN) 5-325 MG per tablet Take 2 tablets by mouth every 4 (four) hours as needed. 06/13/15   Eber Hong, MD  naproxen (NAPROSYN) 500 MG tablet Take 1 tablet (500 mg total) by mouth 2 (two) times daily with a meal. 06/13/15   Eber Hong, MD  ondansetron (ZOFRAN ODT) 4 MG disintegrating tablet Take 1 tablet (4 mg total) by mouth every 8 (eight) hours as needed for nausea. 06/13/15   Eber Hong, MD   BP 103/65 mmHg  Pulse 103  Temp(Src) 99.1 F (37.3 C) (Oral)  Resp 19  Ht 5\' 3"  (1.6 m)  Wt 362 lb (164.202 kg)  BMI 64.14 kg/m2  SpO2 100%  LMP 06/09/2015 Physical Exam  Constitutional: She appears well-developed and well-nourished.  HENT:  Head: Normocephalic and atraumatic.  Mouth/Throat: Oropharynx is clear and moist. No oropharyngeal exudate.  Eyes: Conjunctivae and EOM are normal. Pupils  are equal, round, and reactive to light. Right eye exhibits no discharge. Left eye exhibits no discharge. No scleral icterus.  Oropharynx is clear without erythema exudate or hypertrophy  Neck: Normal range of motion. Neck supple. No JVD present. No thyromegaly present.  Very supple neck  Cardiovascular: Normal rate, regular rhythm, normal heart sounds and intact distal pulses.  Exam reveals no gallop and no friction rub.   No murmur heard. Mild sinus tachycardia at 110 bpm, strong pulses at the radial arteries  Pulmonary/Chest: Effort normal and breath sounds normal. No respiratory distress. She has no wheezes. She has no rales.  Normal  lung sounds without wheezing rhonchi or rales, speaks in full sentences, no increased work of breathing  Abdominal: Soft. Bowel sounds are normal. She exhibits no distension and no mass. There is no tenderness.  Soft nontender morbidly obese abdomen  Musculoskeletal: Normal range of motion. She exhibits tenderness ( diffuse tenderness to palpation to the major muscle groups including the back shoulders arms and legs ). She exhibits no edema.  Lymphadenopathy:    She has no cervical adenopathy.  Neurological: She is alert. No cranial nerve deficit. Coordination normal.  Skin: Skin is warm and dry. No rash noted. No erythema.  Psychiatric: She has a normal mood and affect. Her behavior is normal.  Nursing note and vitals reviewed.   ED Course  Procedures (including critical care time) Labs Review Labs Reviewed  BASIC METABOLIC PANEL - Abnormal; Notable for the following:    Glucose, Bld 113 (*)    Calcium 8.8 (*)    All other components within normal limits  CBC - Abnormal; Notable for the following:    Hemoglobin 11.2 (*)    HCT 35.6 (*)    MCH 24.9 (*)    RDW 16.0 (*)    All other components within normal limits  URINALYSIS, ROUTINE W REFLEX MICROSCOPIC (NOT AT [facility]) - Abnormal; Notable for the following:    Color, Urine AMBER (*)    APPearance  TURBID (*)    Hgb urine dipstick LARGE (*)    Ketones, ur 15 (*)    Leukocytes, UA TRACE (*)    All other components within normal limits  URINE MICROSCOPIC-ADD ON - Abnormal; Notable for the following:    Squamous Epithelial / LPF FEW (*)    Bacteria, UA FEW (*)    All other components within normal limits  CK  I-STAT TROPOININ, ED  I-STAT CG4 LACTIC ACID, ED    Imaging Review Dg Chest 2 View  06/12/2015   CLINICAL DATA:  Chest pain  EXAM: CHEST  2 VIEW  COMPARISON:  12/25/2014  FINDINGS: Cardiomediastinal silhouette is stable. No acute infiltrate or pleural effusion. No pulmonary edema. Bony thorax is unremarkable  IMPRESSION: No active cardiopulmonary disease.   Electronically Signed   By: Natasha Mead M.D.   On: 06/12/2015 20:38   Dg Kayleen Memos  W/kub  06/12/2015   CLINICAL DATA:  Bariatric screening.  History of acid reflux.  EXAM: UPPER GI SERIES WITH KUB  TECHNIQUE: After obtaining a scout radiograph a routine upper GI series was performed using thin barium.  FLUOROSCOPY TIME:  Radiation Exposure Index (as provided by the fluoroscopic device):  If the device does not provide the exposure index:  Fluoroscopy Time (in minutes and seconds):  1 minutes 36 seconds.  Number of Acquired Images:  10  COMPARISON:  None  FINDINGS: The esophagus is patent. No stricture or mass identified. The stomach, duodenal bulb and sweep have a normal configuration. No hiatal hernia or reflux identified.  IMPRESSION: 1. Normal exam.   Electronically Signed   By: Signa Kell M.D.   On: 06/12/2015 09:28     MDM   Final diagnoses:  Viral syndrome    Low-grade fever with myalgias and a tachycardia, likely a viral syndrome, no risk for tick borne illness, check a chest x-ray, labs, CK, urinalysis, Toradol, fluids. Patient is in agreement with the plan.  Labs are normal - no acute findings - UA and CXR as well.  VS improved, fever defervesced, likely viral syndrome - pt given 2 L of  IVF - feels better - meds as  below - stable for d/c, encouraged close 48 hour f/u if no better or immediately if worse.  Meds given in ED:  Medications  ondansetron (ZOFRAN) injection 4 mg (4 mg Intravenous Given 06/12/15 2110)  ketorolac (TORADOL) 30 MG/ML injection 30 mg (30 mg Intravenous Given 06/12/15 2116)  sodium chloride 0.9 % bolus 1,000 mL (0 mLs Intravenous Stopped 06/12/15 2309)  sodium chloride 0.9 % bolus 1,000 mL (0 mLs Intravenous Stopped 06/13/15 0015)  HYDROmorphone (DILAUDID) injection 1 mg (1 mg Intravenous Given 06/12/15 2304)    New Prescriptions   HYDROCODONE-ACETAMINOPHEN (NORCO/VICODIN) 5-325 MG PER TABLET    Take 2 tablets by mouth every 4 (four) hours as needed.   NAPROXEN (NAPROSYN) 500 MG TABLET    Take 1 tablet (500 mg total) by mouth 2 (two) times daily with a meal.   ONDANSETRON (ZOFRAN ODT) 4 MG DISINTEGRATING TABLET    Take 1 tablet (4 mg total) by mouth every 8 (eight) hours as needed for nausea.      Eber Hong, MD 06/13/15 [phone]

## 2015-06-13 ENCOUNTER — Inpatient Hospital Stay (HOSPITAL_COMMUNITY): Admission: RE | Admit: 2015-06-13 | Payer: 59 | Source: Ambulatory Visit

## 2015-06-13 MED ORDER — NAPROXEN 500 MG PO TABS: 500.0000 mg | ORAL_TABLET | Freq: Two times a day (BID) | ORAL | Status: DC

## 2015-06-13 MED ORDER — ONDANSETRON 4 MG PO TBDP: 4.0000 mg | ORAL_TABLET | Freq: Three times a day (TID) | ORAL | Status: DC | PRN

## 2015-06-13 MED ORDER — HYDROCODONE-ACETAMINOPHEN 5-325 MG PO TABS: 2.0000 | ORAL_TABLET | ORAL | Status: DC | PRN

## 2015-06-13 NOTE — Discharge Instructions (Signed)
Please call your doctor for a followup appointment within 24-48 hours. When you talk to your doctor please let them know that you were seen in the emergency department and have them acquire all of your records so that they can discuss the findings with you and formulate a treatment plan to fully care for your new and ongoing problems. ° °

## 2015-06-15 NOTE — Progress Notes (Signed)
  Pre-Operative Nutrition Class:  Appt start time: 6553   End time:  1830.  Patient was seen on 06/11/2015 for Pre-Operative Bariatric Surgery Education at [facility].   Surgery date: 06/25/15 Surgery type: Sleeve Gastrectomy Start weight at [facility]: 356 lbs on 02/03/15 Weight today: 362 lbs  TANITA  BODY COMP RESULTS  06/11/15   BMI (kg/m^2) 64.1   Fat Mass (lbs) 193.5   Fat Free Mass (lbs) 168.5   Total Body Water (lbs) 123.5   Samples given per MNT protocol. Patient educated on appropriate usage: Unjury protein powder (qty 1 - chicken soup) Lot #: [id number] Exp: 05/2016  Celebrate Vitamins Multivitamin (qty 1 - orange) Lot #: [id number] Exp: 01/2017  Celebrate Vitamins Calcium Citrate chew (qty 1 -chocolate) Lot #: [id number] Exp: 01/2017  Premier protein shake (qty 1 - strawberry) Lot #: [id number] Exp: 08/2015  The following the learning objectives were met by the patient during this course:  Identify Pre-Op Dietary Goals and will begin 2 weeks pre-operatively  Identify appropriate sources of fluids and proteins   State protein recommendations and appropriate sources pre and post-operatively  Identify Post-Operative Dietary Goals and will follow for 2 weeks post-operatively  Identify appropriate multivitamin and calcium sources  Describe the need for physical activity post-operatively and will follow MD recommendations  State when to call healthcare provider regarding medication questions or post-operative complications  Handouts given during class include:  Pre-Op Bariatric Surgery Diet Handout  Protein Shake Handout  Post-Op Bariatric Surgery Nutrition Handout  BELT Program Information Flyer  Support Group Information Flyer  [facility] Bariatric Supplements Price List  Follow-Up Plan: Patient will follow-up at [facility] 2 weeks post operatively for diet advancement per MD.

## 2015-06-18 NOTE — Progress Notes (Signed)
Chest x-ray 06/12/15 on EPIC, EKG 06/13/15 on EPIC, EKG 09/18/2014 on chart, LOV note Dr. Jacinto HalimGanji 09/18/14 on chart

## 2015-06-18 NOTE — Patient Instructions (Signed)
Denice BorsShakeita Ailey  06/18/2015   Your procedure is scheduled on: Monday 06/25/15  [facility]  [facility] 3rd floor to  Short Stay Center at 05:15 AM.  Call this number if you have problems the morning of surgery [phone]   Remember: Please bring inhaler on day of surgery.   ONLY 1 PERSON MAY GO WITH YOU TO SHORT STAY TO GET  READY MORNING OF YOUR SURGERY.  Do not eat food or drink liquids :After Midnight.   Take these medicines the morning of surgery with A SIP OF WATER: inhaler, zyrtec, allegra, labetalol, nasacort                               You may not have any metal on your body including hair pins and              piercings  Do not wear jewelry, make-up, lotions, powders or perfumes, deodorant             Do not wear nail polish.  Do not shave  48 hours prior to surgery.              Men may shave face and neck.  Do not bring valuables to the hospital. [facility] IS NOT             RESPONSIBLE   FOR VALUABLES.  Contacts, dentures or bridgework may not be worn into surgery.  Leave suitcase in the car. After surgery it may be brought to your room.   ________________________________________________________  [facility] - Preparing for Surgery Before surgery, you can play an important role.  Because skin is not sterile, your skin needs to be as free of germs as possible.  You can reduce the number of germs on your skin by washing with CHG (chlorahexidine gluconate) soap before surgery.  CHG is an antiseptic cleaner which kills germs and bonds with the skin to continue killing germs even after washing. Please DO NOT use if you have an allergy to CHG or antibacterial soaps.  If your skin becomes reddened/irritated stop using the CHG and inform your nurse when you arrive at Short Stay. Do not shave (including legs and underarms) for at least 48 hours prior to the first CHG shower.  You may shave your face/neck. Please follow  these instructions carefully:  1.  Shower with CHG Soap the night before surgery and the  morning of Surgery.  2.  If you choose to wash your hair, wash your hair first as usual with your  normal  shampoo.  3.  After you shampoo, rinse your hair and body thoroughly to remove the  shampoo.                            4.  Use CHG as you would any other liquid soap.  You can apply chg directly  to the skin and wash                       Gently with a scrungie or clean washcloth.  5.  Apply the CHG Soap to your body ONLY FROM THE NECK DOWN.   Do not use on face/ open  Wound or open sores. Avoid contact with eyes, ears mouth and genitals (private parts).                       Wash face,  Genitals (private parts) with your normal soap.             6.  Wash thoroughly, paying special attention to the area where your surgery  will be performed.  7.  Thoroughly rinse your body with warm water from the neck down.  8.  DO NOT shower/wash with your normal soap after using and rinsing off  the CHG Soap.                9.  Pat yourself dry with a clean towel.            10.  Wear clean pajamas.            11.  Place clean sheets on your bed the night of your first shower and do not  sleep with pets. Day of Surgery : Do not apply any lotions/deodorants the morning of surgery.  Please wear clean clothes to the hospital/surgery center.  FAILURE TO FOLLOW THESE INSTRUCTIONS MAY RESULT IN THE CANCELLATION OF YOUR SURGERY PATIENT SIGNATURE_________________________________  NURSE SIGNATURE__________________________________  ________________________________________________________________________

## 2015-06-19 ENCOUNTER — Inpatient Hospital Stay (HOSPITAL_COMMUNITY)
Admission: RE | Admit: 2015-06-19 | Discharge: 2015-06-19 | Disposition: A | Discharge status: Home / Self Care | Payer: 59 | Source: Ambulatory Visit

## 2015-06-19 NOTE — Patient Instructions (Addendum)
Amber Daniels  06/19/2015   Your procedure is scheduled on:    06/25/2015    [facility]  [facility]  [place] to 3rd floor to  Short Stay Center at     0515 AM.  Call this number if you have problems the morning of surgery [phone]   Remember: ONLY 1 PERSON MAY GO WITH YOU TO SHORT STAY TO GET  READY MORNING OF YOUR SURGERY.  Do not eat food or drink liquids :After Midnight.     Take these medicines the morning of surgery with A SIP OF WATER:    Inhalers and bring, Zyrtec  Or   Allegra, Labetalol, nasacort                                You may not have any metal on your body including hair pins and              piercings  Do not wear jewelry, make-up, lotions, powders or perfumes, deodorant             Do not wear nail polish.  Do not shave  48 hours prior to surgery.                Do not bring valuables to the hospital. [facility] IS NOT             RESPONSIBLE   FOR VALUABLES.  Contacts, dentures or bridgework may not be worn into surgery.  Leave suitcase in the car. After surgery it may be brought to your room.     :  Special Instructions: coughing and deep breathing exercises, leg exercises               Please read over the following fact sheets you were given: _____________________________________________________________________             [facility] - Preparing for Surgery Before surgery, you can play an important role.  Because skin is not sterile, your skin needs to be as free of germs as possible.  You can reduce the number of germs on your skin by washing with CHG (chlorahexidine gluconate) soap before surgery.  CHG is an antiseptic cleaner which kills germs and bonds with the skin to continue killing germs even after washing. Please DO NOT use if you have an allergy to CHG or antibacterial soaps.  If your skin becomes reddened/irritated stop using the CHG and inform your nurse when you arrive at Short Stay. Do  not shave (including legs and underarms) for at least 48 hours prior to the first CHG shower.  You may shave your face/neck. Please follow these instructions carefully:  1.  Shower with CHG Soap the night before surgery and the  morning of Surgery.  2.  If you choose to wash your hair, wash your hair first as usual with your  normal  shampoo.  3.  After you shampoo, rinse your hair and body thoroughly to remove the  shampoo.                           4.  Use CHG as you would any other liquid soap.  You can apply chg directly  to the skin and wash  Gently with a scrungie or clean washcloth.  5.  Apply the CHG Soap to your body ONLY FROM THE NECK DOWN.   Do not use on face/ open                           Wound or open sores. Avoid contact with eyes, ears mouth and genitals (private parts).                       Wash face,  Genitals (private parts) with your normal soap.             6.  Wash thoroughly, paying special attention to the area where your surgery  will be performed.  7.  Thoroughly rinse your body with warm water from the neck down.  8.  DO NOT shower/wash with your normal soap after using and rinsing off  the CHG Soap.                9.  Pat yourself dry with a clean towel.            10.  Wear clean pajamas.            11.  Place clean sheets on your bed the night of your first shower and do not  sleep with pets. Day of Surgery : Do not apply any lotions/deodorants the morning of surgery.  Please wear clean clothes to the hospital/surgery center.  FAILURE TO FOLLOW THESE INSTRUCTIONS MAY RESULT IN THE CANCELLATION OF YOUR SURGERY PATIENT SIGNATURE_________________________________  NURSE SIGNATURE__________________________________  ________________________________________________________________________

## 2015-06-20 ENCOUNTER — Encounter (HOSPITAL_COMMUNITY): Payer: Self-pay

## 2015-06-20 ENCOUNTER — Encounter (HOSPITAL_COMMUNITY)
Admission: RE | Admit: 2015-06-20 | Discharge: 2015-06-20 | Disposition: A | Discharge status: Home / Self Care | Payer: 59 | Source: Ambulatory Visit | Attending: Surgery | Admitting: Surgery

## 2015-06-20 DIAGNOSIS — Z6841 Body Mass Index (BMI) 40.0 and over, adult: Secondary | ICD-10-CM | POA: Diagnosis not present

## 2015-06-20 DIAGNOSIS — Z01812 Encounter for preprocedural laboratory examination: Secondary | ICD-10-CM | POA: Insufficient documentation

## 2015-06-20 HISTORY — DX: Headache, unspecified: R51.9

## 2015-06-20 HISTORY — DX: Other specified postprocedural states: R11.2

## 2015-06-20 HISTORY — DX: Headache: R51

## 2015-06-20 HISTORY — DX: Unspecified osteoarthritis, unspecified site: M19.90

## 2015-06-20 HISTORY — DX: Edema, unspecified: R60.9

## 2015-06-20 HISTORY — DX: Anemia, unspecified: D64.9

## 2015-06-20 HISTORY — DX: Gastro-esophageal reflux disease without esophagitis: K21.9

## 2015-06-20 HISTORY — DX: Other specified postprocedural states: Z98.890

## 2015-06-20 HISTORY — DX: Reserved for inherently not codable concepts without codable children: IMO0001

## 2015-06-20 LAB — COMPREHENSIVE METABOLIC PANEL
ALK PHOS: 68 U/L (ref 38–126)
ALT: 39 U/L (ref 14–54)
ANION GAP: 5 (ref 5–15)
AST: 22 U/L (ref 15–41)
Albumin: 4.1 g/dL (ref 3.5–5.0)
BUN: 9 mg/dL (ref 6–20)
CO2: 27 mmol/L (ref 22–32)
Calcium: 8.9 mg/dL (ref 8.9–10.3)
Chloride: 105 mmol/L (ref 101–111)
Creatinine, Ser: 0.57 mg/dL (ref 0.44–1.00)
Glucose, Bld: 104 mg/dL — ABNORMAL HIGH (ref 65–99)
POTASSIUM: 4.3 mmol/L (ref 3.5–5.1)
Sodium: 137 mmol/L (ref 135–145)
Total Bilirubin: 0.5 mg/dL (ref 0.3–1.2)
Total Protein: 7.6 g/dL (ref 6.5–8.1)

## 2015-06-20 LAB — CBC WITH DIFFERENTIAL/PLATELET
BASOS ABS: 0 10*3/uL (ref 0.0–0.1)
BASOS PCT: 0 % (ref 0–1)
EOS ABS: 0.1 10*3/uL (ref 0.0–0.7)
EOS PCT: 1 % (ref 0–5)
HCT: 37.9 % (ref 36.0–46.0)
Hemoglobin: 11.3 g/dL — ABNORMAL LOW (ref 12.0–15.0)
LYMPHS ABS: 1.3 10*3/uL (ref 0.7–4.0)
Lymphocytes Relative: 21 % (ref 12–46)
MCH: 24.4 pg — ABNORMAL LOW (ref 26.0–34.0)
MCHC: 29.8 g/dL — AB (ref 30.0–36.0)
MCV: 81.9 fL (ref 78.0–100.0)
Monocytes Absolute: 0.4 10*3/uL (ref 0.1–1.0)
Monocytes Relative: 7 % (ref 3–12)
NEUTROS ABS: 4.2 10*3/uL (ref 1.7–7.7)
Neutrophils Relative %: 71 % (ref 43–77)
Platelets: 272 10*3/uL (ref 150–400)
RBC: 4.63 MIL/uL (ref 3.87–5.11)
RDW: 16.5 % — AB (ref 11.5–15.5)
WBC: 6 10*3/uL (ref 4.0–10.5)

## 2015-06-20 LAB — HCG, SERUM, QUALITATIVE: PREG SERUM: NEGATIVE

## 2015-06-20 NOTE — Progress Notes (Addendum)
EKG- 12/25/2014 and 06/13/2015 in EPIC  CXR- 12/25/2014 and 06/12/2015 in EPIC  [facility]2015[facility]  EKG- 09/18/2014 on chart  LOV- with DR Jacinto HalimGanji- 09/18/14 on chart

## 2015-06-25 ENCOUNTER — Inpatient Hospital Stay (HOSPITAL_COMMUNITY): Payer: 59 | Admitting: Anesthesiology

## 2015-06-25 ENCOUNTER — Encounter (HOSPITAL_COMMUNITY): Payer: Self-pay | Admitting: *Deleted

## 2015-06-25 ENCOUNTER — Encounter (HOSPITAL_COMMUNITY)
Admission: RE | Disposition: A | Discharge status: Home / Self Care | Payer: Self-pay | Source: Ambulatory Visit | Attending: Surgery

## 2015-06-25 ENCOUNTER — Inpatient Hospital Stay (HOSPITAL_COMMUNITY)
Admission: RE | Admit: 2015-06-25 | Discharge: 2015-06-27 | DRG: 621 | Disposition: A | Discharge status: Home / Self Care | Payer: 59 | Source: Ambulatory Visit | Attending: Surgery | Admitting: Surgery

## 2015-06-25 DIAGNOSIS — K219 Gastro-esophageal reflux disease without esophagitis: Secondary | ICD-10-CM | POA: Diagnosis present

## 2015-06-25 DIAGNOSIS — Z9884 Bariatric surgery status: Secondary | ICD-10-CM

## 2015-06-25 DIAGNOSIS — G4730 Sleep apnea, unspecified: Secondary | ICD-10-CM | POA: Diagnosis present

## 2015-06-25 DIAGNOSIS — Z6841 Body Mass Index (BMI) 40.0 and over, adult: Secondary | ICD-10-CM | POA: Diagnosis not present

## 2015-06-25 DIAGNOSIS — I10 Essential (primary) hypertension: Secondary | ICD-10-CM | POA: Diagnosis present

## 2015-06-25 DIAGNOSIS — J45909 Unspecified asthma, uncomplicated: Secondary | ICD-10-CM | POA: Diagnosis present

## 2015-06-25 HISTORY — PX: LAPAROSCOPIC GASTRIC SLEEVE RESECTION: SHX5895

## 2015-06-25 LAB — CREATININE, SERUM
Creatinine, Ser: 0.63 mg/dL (ref 0.44–1.00)
GFR calc Af Amer: >60 mL/min (ref >60)
GFR calc non Af Amer: >60 mL/min (ref >60)

## 2015-06-25 LAB — CBC
HCT: 36.7 % (ref 36.0–46.0)
Hemoglobin: 11.3 g/dL — ABNORMAL LOW (ref 12.0–15.0)
MCH: 25.2 pg — ABNORMAL LOW (ref 26.0–34.0)
MCHC: 30.8 g/dL (ref 30.0–36.0)
MCV: 81.9 fL (ref 78.0–100.0)
Platelets: 224 10*3/uL (ref 150–400)
RBC: 4.48 MIL/uL (ref 3.87–5.11)
RDW: 16.8 % — AB (ref 11.5–15.5)
WBC: 9.5 10*3/uL (ref 4.0–10.5)

## 2015-06-25 LAB — HEMOGLOBIN AND HEMATOCRIT, BLOOD

## 2015-06-25 SURGERY — GASTRECTOMY, SLEEVE, LAPAROSCOPIC
Anesthesia: General | Site: Abdomen

## 2015-06-25 MED ORDER — PROPOFOL 10 MG/ML IV BOLUS
INTRAVENOUS | Status: AC
  Filled 2015-06-25: qty 20

## 2015-06-25 MED ORDER — KCL IN DEXTROSE-NACL 20-5-0.45 MEQ/L-%-% IV SOLN
INTRAVENOUS | Status: DC
  Administered 2015-06-25 – 2015-06-27 (×5): via INTRAVENOUS
  Filled 2015-06-25 (×6): qty 1000

## 2015-06-25 MED ORDER — GLYCOPYRROLATE 0.2 MG/ML IJ SOLN
INTRAMUSCULAR | Status: AC
  Filled 2015-06-25: qty 4

## 2015-06-25 MED ORDER — ONDANSETRON HCL 4 MG/2ML IJ SOLN
4.0000 mg | INTRAMUSCULAR | Status: DC | PRN
Start: 2015-06-25 — End: 2015-06-27
  Administered 2015-06-25 – 2015-06-26 (×4): 4 mg via INTRAVENOUS
  Filled 2015-06-25 (×4): qty 2

## 2015-06-25 MED ORDER — FENTANYL CITRATE (PF) 100 MCG/2ML IJ SOLN
INTRAMUSCULAR | Status: AC
  Filled 2015-06-25: qty 2

## 2015-06-25 MED ORDER — HEPARIN SODIUM (PORCINE) 5000 UNIT/ML IJ SOLN
5000.0000 [IU] | Freq: Three times a day (TID) | INTRAMUSCULAR | Status: DC
  Administered 2015-06-25 – 2015-06-27 (×5): 5000 [IU] via SUBCUTANEOUS
  Filled 2015-06-25 (×8): qty 1

## 2015-06-25 MED ORDER — ACETAMINOPHEN 160 MG/5ML PO SOLN: 325.0000 mg | ORAL | Status: DC | PRN

## 2015-06-25 MED ORDER — SODIUM CHLORIDE 0.9 % IJ SOLN
INTRAMUSCULAR | Status: AC
  Filled 2015-06-25: qty 10

## 2015-06-25 MED ORDER — HYDROMORPHONE HCL 1 MG/ML IJ SOLN
INTRAMUSCULAR | Status: DC | PRN
  Administered 2015-06-25: .5 mg via INTRAVENOUS
  Administered 2015-06-25: 0.5 mg via INTRAVENOUS
  Administered 2015-06-25 (×2): .5 mg via INTRAVENOUS

## 2015-06-25 MED ORDER — UNJURY CHICKEN SOUP POWDER
2.0000 [oz_av] | Freq: Four times a day (QID) | ORAL | Status: DC
  Administered 2015-06-27: 2 [oz_av] via ORAL

## 2015-06-25 MED ORDER — OXYCODONE HCL 5 MG/5ML PO SOLN
5.0000 mg | ORAL | Status: DC | PRN
  Administered 2015-06-26: 10 mg via ORAL
  Administered 2015-06-26: 5 mg via ORAL
  Administered 2015-06-26 – 2015-06-27 (×5): 10 mg via ORAL
  Filled 2015-06-25 (×3): qty 10
  Filled 2015-06-25: qty 5
  Filled 2015-06-25 (×3): qty 10

## 2015-06-25 MED ORDER — LACTATED RINGERS IV SOLN: INTRAVENOUS | Status: DC

## 2015-06-25 MED ORDER — GLYCOPYRROLATE 0.2 MG/ML IJ SOLN
INTRAMUSCULAR | Status: DC | PRN
  Administered 2015-06-25: .8 mg via INTRAVENOUS

## 2015-06-25 MED ORDER — PROMETHAZINE HCL 25 MG/ML IJ SOLN
INTRAMUSCULAR | Status: AC
  Filled 2015-06-25: qty 1

## 2015-06-25 MED ORDER — LIDOCAINE HCL (CARDIAC) 20 MG/ML IV SOLN
INTRAVENOUS | Status: DC | PRN
  Administered 2015-06-25: 100 mg via INTRAVENOUS

## 2015-06-25 MED ORDER — ROCURONIUM BROMIDE 100 MG/10ML IV SOLN
INTRAVENOUS | Status: AC
Start: 2015-06-25 — End: 2015-06-25
  Filled 2015-06-25: qty 1

## 2015-06-25 MED ORDER — DEXAMETHASONE SODIUM PHOSPHATE 10 MG/ML IJ SOLN
INTRAMUSCULAR | Status: DC | PRN
  Administered 2015-06-25: 10 mg via INTRAVENOUS

## 2015-06-25 MED ORDER — EPHEDRINE SULFATE 50 MG/ML IJ SOLN
INTRAMUSCULAR | Status: AC
  Filled 2015-06-25: qty 1

## 2015-06-25 MED ORDER — FENTANYL CITRATE (PF) 100 MCG/2ML IJ SOLN
INTRAMUSCULAR | Status: DC | PRN
  Administered 2015-06-25: 100 ug via INTRAVENOUS

## 2015-06-25 MED ORDER — LIDOCAINE HCL (CARDIAC) 20 MG/ML IV SOLN
INTRAVENOUS | Status: AC
  Filled 2015-06-25: qty 5

## 2015-06-25 MED ORDER — MIDAZOLAM HCL 5 MG/5ML IJ SOLN
INTRAMUSCULAR | Status: DC | PRN
  Administered 2015-06-25: 2 mg via INTRAVENOUS

## 2015-06-25 MED ORDER — PHENYLEPHRINE HCL 10 MG/ML IJ SOLN
INTRAMUSCULAR | Status: DC | PRN
  Administered 2015-06-25 (×3): 80 ug via INTRAVENOUS

## 2015-06-25 MED ORDER — UNJURY CHOCOLATE CLASSIC POWDER: 2.0000 [oz_av] | Freq: Four times a day (QID) | ORAL | Status: DC

## 2015-06-25 MED ORDER — PROPOFOL 10 MG/ML IV BOLUS
INTRAVENOUS | Status: DC | PRN
  Administered 2015-06-25: 200 mg via INTRAVENOUS

## 2015-06-25 MED ORDER — HYDROMORPHONE HCL 1 MG/ML IJ SOLN: 0.2500 mg | INTRAMUSCULAR | Status: DC | PRN

## 2015-06-25 MED ORDER — DEXAMETHASONE SODIUM PHOSPHATE 10 MG/ML IJ SOLN
INTRAMUSCULAR | Status: AC
Start: 2015-06-25 — End: 2015-06-25
  Filled 2015-06-25: qty 1

## 2015-06-25 MED ORDER — HYDROMORPHONE HCL 2 MG/ML IJ SOLN
INTRAMUSCULAR | Status: AC
  Filled 2015-06-25: qty 1

## 2015-06-25 MED ORDER — ONDANSETRON HCL 4 MG/2ML IJ SOLN
INTRAMUSCULAR | Status: DC | PRN
  Administered 2015-06-25: 4 mg via INTRAVENOUS

## 2015-06-25 MED ORDER — CHLORHEXIDINE GLUCONATE CLOTH 2 % EX PADS: 6.0000 | MEDICATED_PAD | Freq: Once | CUTANEOUS | Status: DC

## 2015-06-25 MED ORDER — UNJURY VANILLA POWDER
2.0000 [oz_av] | Freq: Four times a day (QID) | ORAL | Status: DC
  Administered 2015-06-27: 2 [oz_av] via ORAL

## 2015-06-25 MED ORDER — MIDAZOLAM HCL 2 MG/2ML IJ SOLN
INTRAMUSCULAR | Status: AC
  Filled 2015-06-25: qty 2

## 2015-06-25 MED ORDER — PHENYLEPHRINE 40 MCG/ML (10ML) SYRINGE FOR IV PUSH (FOR BLOOD PRESSURE SUPPORT)
PREFILLED_SYRINGE | INTRAVENOUS | Status: AC
  Filled 2015-06-25: qty 10

## 2015-06-25 MED ORDER — DEXTROSE 5 % IV SOLN
INTRAVENOUS | Status: AC
  Filled 2015-06-25: qty 2

## 2015-06-25 MED ORDER — NEOSTIGMINE METHYLSULFATE 10 MG/10ML IV SOLN
INTRAVENOUS | Status: AC
  Filled 2015-06-25: qty 1

## 2015-06-25 MED ORDER — LACTATED RINGERS IV SOLN
INTRAVENOUS | Status: DC | PRN
  Administered 2015-06-25 (×2): via INTRAVENOUS

## 2015-06-25 MED ORDER — HEPARIN SODIUM (PORCINE) 5000 UNIT/ML IJ SOLN
5000.0000 [IU] | INTRAMUSCULAR | Status: AC
  Administered 2015-06-25: 5000 [IU] via SUBCUTANEOUS
  Filled 2015-06-25: qty 1

## 2015-06-25 MED ORDER — ROCURONIUM BROMIDE 100 MG/10ML IV SOLN
INTRAVENOUS | Status: DC | PRN
Start: 2015-06-25 — End: 2015-06-25
  Administered 2015-06-25 (×2): 30 mg via INTRAVENOUS
  Administered 2015-06-25: 20 mg via INTRAVENOUS

## 2015-06-25 MED ORDER — MORPHINE SULFATE 2 MG/ML IJ SOLN
2.0000 mg | INTRAMUSCULAR | Status: DC | PRN
  Administered 2015-06-25: 4 mg via INTRAVENOUS
  Administered 2015-06-25: 6 mg via INTRAVENOUS
  Administered 2015-06-25: 2 mg via INTRAVENOUS
  Administered 2015-06-25 (×3): 4 mg via INTRAVENOUS
  Administered 2015-06-25 (×2): 6 mg via INTRAVENOUS
  Administered 2015-06-26 (×4): 4 mg via INTRAVENOUS
  Administered 2015-06-26: 6 mg via INTRAVENOUS
  Filled 2015-06-25: qty 3
  Filled 2015-06-25 (×3): qty 2
  Filled 2015-06-25: qty 3
  Filled 2015-06-25 (×2): qty 2
  Filled 2015-06-25: qty 3
  Filled 2015-06-25: qty 2
  Filled 2015-06-25: qty 3
  Filled 2015-06-25 (×2): qty 2
  Filled 2015-06-25 (×2): qty 1

## 2015-06-25 MED ORDER — ACETAMINOPHEN 160 MG/5ML PO SOLN
650.0000 mg | ORAL | Status: DC | PRN
  Administered 2015-06-26: 650 mg via ORAL
  Filled 2015-06-25: qty 20.3

## 2015-06-25 MED ORDER — ONDANSETRON HCL 4 MG/2ML IJ SOLN
INTRAMUSCULAR | Status: AC
Start: 2015-06-25 — End: 2015-06-25
  Filled 2015-06-25: qty 2

## 2015-06-25 MED ORDER — PROMETHAZINE HCL 25 MG/ML IJ SOLN
6.2500 mg | INTRAMUSCULAR | Status: DC | PRN
Start: 2015-06-25 — End: 2015-06-25
  Administered 2015-06-25: 6.26 mg via INTRAVENOUS

## 2015-06-25 MED ORDER — BUPIVACAINE-EPINEPHRINE 0.25% -1:200000 IJ SOLN
INTRAMUSCULAR | Status: AC
  Filled 2015-06-25: qty 1

## 2015-06-25 MED ORDER — BUPIVACAINE LIPOSOME 1.3 % IJ SUSP
20.0000 mL | Freq: Once | INTRAMUSCULAR | Status: AC
  Administered 2015-06-25: 20 mL
  Filled 2015-06-25: qty 20

## 2015-06-25 MED ORDER — NEOSTIGMINE METHYLSULFATE 10 MG/10ML IV SOLN
INTRAVENOUS | Status: DC | PRN
  Administered 2015-06-25: 5 mg via INTRAVENOUS

## 2015-06-25 MED ORDER — SUCCINYLCHOLINE CHLORIDE 20 MG/ML IJ SOLN
INTRAMUSCULAR | Status: DC | PRN
  Administered 2015-06-25: 100 mg via INTRAVENOUS

## 2015-06-25 MED ORDER — DEXTROSE 5 % IV SOLN
2.0000 g | INTRAVENOUS | Status: AC
  Administered 2015-06-25: 2 g via INTRAVENOUS

## 2015-06-25 SURGICAL SUPPLY — 69 items
APL SRG 32X5 SNPLK LF DISP (MISCELLANEOUS)
APPLICATOR COTTON TIP 6IN STRL (MISCELLANEOUS) IMPLANT
APPLIER CLIP 5 13 M/L LIGAMAX5 (MISCELLANEOUS)
APPLIER CLIP ROT 10 11.4 M/L (STAPLE)
APPLIER CLIP ROT 13.4 12 LRG (CLIP)
APR CLP LRG 13.4X12 ROT 20 MLT (CLIP)
APR CLP MED LRG 11.4X10 (STAPLE)
APR CLP MED LRG 5 ANG JAW (MISCELLANEOUS)
BLADE SURG 15 STRL LF DISP TIS (BLADE) ×1 IMPLANT
BLADE SURG 15 STRL SS (BLADE) ×3
CABLE HIGH FREQUENCY MONO STRZ (ELECTRODE) ×2 IMPLANT
CLIP APPLIE 5 13 M/L LIGAMAX5 (MISCELLANEOUS) IMPLANT
CLIP APPLIE ROT 10 11.4 M/L (STAPLE) IMPLANT
CLIP APPLIE ROT 13.4 12 LRG (CLIP) IMPLANT
COVER SURGICAL LIGHT HANDLE (MISCELLANEOUS) ×3 IMPLANT
DEVICE SUT QUICK LOAD TK 5 (STAPLE) IMPLANT
DEVICE SUT TI-KNOT TK 5X26 (MISCELLANEOUS) IMPLANT
DEVICE SUTURE ENDOST 10MM (ENDOMECHANICALS) IMPLANT
DEVICE TI KNOT TK5 (MISCELLANEOUS)
DEVICE TROCAR PUNCTURE CLOSURE (ENDOMECHANICALS) ×3 IMPLANT
DISSECTOR BLUNT TIP ENDO 5MM (MISCELLANEOUS) ×3 IMPLANT
DRAPE CAMERA CLOSED 9X96 (DRAPES) ×3 IMPLANT
ELECT REM PT RETURN 9FT ADLT (ELECTROSURGICAL) ×3
ELECTRODE REM PT RTRN 9FT ADLT (ELECTROSURGICAL) ×1 IMPLANT
GAUZE SPONGE 4X4 12PLY STRL (GAUZE/BANDAGES/DRESSINGS) IMPLANT
GLOVE BIOGEL M 8.0 STRL (GLOVE) ×3 IMPLANT
GOWN STRL REUS W/TWL XL LVL3 (GOWN DISPOSABLE) ×12 IMPLANT
HANDLE STAPLE EGIA 4 XL (STAPLE) ×3 IMPLANT
HOVERMATT SINGLE USE (MISCELLANEOUS) ×3 IMPLANT
KIT BASIN OR (CUSTOM PROCEDURE TRAY) ×3 IMPLANT
LIQUID BAND (GAUZE/BANDAGES/DRESSINGS) IMPLANT
NDL SPNL 22GX3.5 QUINCKE BK (NEEDLE) ×1 IMPLANT
NEEDLE SPNL 22GX3.5 QUINCKE BK (NEEDLE) ×3 IMPLANT
PACK UNIVERSAL I (CUSTOM PROCEDURE TRAY) ×3 IMPLANT
PEN SKIN MARKING BROAD (MISCELLANEOUS) ×3 IMPLANT
QUICK LOAD TK 5 (STAPLE)
RELOAD STAPLE 45 PURP MED/THCK (STAPLE) IMPLANT
RELOAD TRI 45 ART MED THCK BLK (STAPLE) ×3 IMPLANT
RELOAD TRI 45 ART MED THCK PUR (STAPLE) IMPLANT
RELOAD TRI 60 ART MED THCK BLK (STAPLE) ×3 IMPLANT
RELOAD TRI 60 ART MED THCK PUR (STAPLE) ×3 IMPLANT
SCISSORS LAP 5X45 EPIX DISP (ENDOMECHANICALS) ×2 IMPLANT
SCRUB PCMX 4 OZ (MISCELLANEOUS) ×6 IMPLANT
SEALANT SURGICAL APPL DUAL CAN (MISCELLANEOUS) IMPLANT
SET IRRIG TUBING LAPAROSCOPIC (IRRIGATION / IRRIGATOR) ×3 IMPLANT
SHEARS CURVED HARMONIC AC 45CM (MISCELLANEOUS) ×3 IMPLANT
SLEEVE ADV FIXATION 5X100MM (TROCAR) ×6 IMPLANT
SLEEVE GASTRECTOMY 36FR VISIGI (MISCELLANEOUS) ×3 IMPLANT
SOLUTION ANTI FOG 6CC (MISCELLANEOUS) ×3 IMPLANT
SPONGE LAP 18X18 X RAY DECT (DISPOSABLE) ×3 IMPLANT
STAPLER VISISTAT 35W (STAPLE) ×3 IMPLANT
SUT SURGIDAC NAB ES-9 0 48 120 (SUTURE) IMPLANT
SUT VIC AB 4-0 SH 18 (SUTURE) ×3 IMPLANT
SUT VICRYL 0 TIES 12 18 (SUTURE) ×3 IMPLANT
SYR 10ML ECCENTRIC (SYRINGE) ×3 IMPLANT
SYR 20CC LL (SYRINGE) ×3 IMPLANT
SYR 50ML LL SCALE MARK (SYRINGE) ×3 IMPLANT
TOWEL OR 17X26 10 PK STRL BLUE (TOWEL DISPOSABLE) ×6 IMPLANT
TOWEL OR NON WOVEN STRL DISP B (DISPOSABLE) ×3 IMPLANT
TRAY FOLEY W/METER SILVER 14FR (SET/KITS/TRAYS/PACK) IMPLANT
TROCAR ADV FIXATION 12X100MM (TROCAR) ×3 IMPLANT
TROCAR ADV FIXATION 5X100MM (TROCAR) ×3 IMPLANT
TROCAR BLADELESS 15MM (ENDOMECHANICALS) ×3 IMPLANT
TROCAR BLADELESS OPT 5 100 (ENDOMECHANICALS) ×3 IMPLANT
TUBE CALIBRATION LAPBAND (TUBING) IMPLANT
TUBING CONNECTING 10 (TUBING) ×2 IMPLANT
TUBING CONNECTING 10' (TUBING) ×1
TUBING ENDO SMARTCAP (MISCELLANEOUS) ×3 IMPLANT
TUBING FILTER THERMOFLATOR (ELECTROSURGICAL) ×3 IMPLANT

## 2015-06-25 NOTE — H&P (View-Only) (Signed)
Chief Complaint:  Morbid obesity with BMI 64  History of Present Illness:  Amber Daniels is an 34 y.o. female initially seen in the office this past December for sleeve gastrectomy.  She had all of her questions answered and is ready for surgery.  Her ultrasound showed no gallstones.  UGI is pending.  She is ready for surgery.    Past Medical History  Diagnosis Date  . Asthma   . Postpartum care following cesarean delivery (12/2) 11/01/2013  . Morbid obesity   . Allergy   . Heart murmur   . Hypertension   . Sleep apnea     not on CPAP, dx 01/2013 with Outpatietn overnight study at home.     Past Surgical History  Procedure Laterality Date  . Cesarean section    . Cesarean section N/A 11/01/2013    Procedure: REPEAT CESAREAN SECTION;  Surgeon: Serita KyleSheronette A Cousins, MD;  Location: [facility];  Service: Obstetrics;  Laterality: N/A;  EDD: 11/04/13  . Breath tek h pylori N/A 02/22/2015    Procedure: BREATH TEK H PYLORI;  Surgeon: Luretha MurphyMatthew Briana Newman, MD;  [facility];  Service: General;  Laterality: N/A;    Current Outpatient Prescriptions  Medication Sig Dispense Refill  . acetaminophen (TYLENOL) 500 MG tablet Take 1,500 mg by mouth every 6 (six) hours as needed. For pain.    Marland Kitchen. albuterol (PROVENTIL HFA;VENTOLIN HFA) 108 (90 BASE) MCG/ACT inhaler Inhale 2 puffs into the lungs every 4 (four) hours as needed for wheezing or shortness of breath.    . beclomethasone (QVAR) 40 MCG/ACT inhaler Inhale 2 puffs into the lungs daily.    . cetirizine (ZYRTEC) 10 MG tablet Take 1 tablet (10 mg total) by mouth daily. 30 tablet 3  . chlorthalidone (HYGROTON) 25 MG tablet Take 1 tablet (25 mg total) by mouth daily. 30 tablet 5  . cyclobenzaprine (FLEXERIL) 5 MG tablet Take 1 tablet (5 mg total) by mouth 3 (three) times daily as needed for muscle spasms. 30 tablet 0  . fexofenadine (ALLEGRA) 60 MG tablet Take 60 mg by mouth daily.    Marland Kitchen. FLUOCINOLONE ACETONIDE SCALP 0.01 % OIL Use 1 oz on scalp and work  through scalp for 5 min daily  and then thoroughly rinse with water 118 mL 0  . HYDROcodone-acetaminophen (NORCO) 5-325 MG per tablet Take 1 tablet by mouth every 6 (six) hours as needed for moderate pain. Do not take extra tylenol with this, may cause constipation,take stool softener 30 tablet 0  . ibuprofen (ADVIL,MOTRIN) 600 MG tablet Take 1 tablet (600 mg total) by mouth every 6 (six) hours as needed for mild pain. 30 tablet 1  . ketoconazole (NIZORAL) 2 % shampoo Apply 1 application topically 2 (two) times a week. Lather affected eareas, leave on for 5 min then rinse. 120 mL 0  . labetalol (NORMODYNE) 100 MG tablet Take 1 tablet (100 mg total) by mouth 2 (two) times daily. 60 tablet 5  . montelukast (SINGULAIR) 10 MG tablet Take 1 tablet (10 mg total) by mouth at bedtime. 30 tablet 0  . NOREL AD 4-10-325 MG TABS Take 1 tablet by mouth 4 (four) times daily.  2  . triamcinolone (NASACORT ALLERGY 24HR) 55 MCG/ACT AERO nasal inhaler Place 2 sprays into the nose daily. 1 Inhaler 12   No current facility-administered medications for this visit.   Other Family History  Problem Relation Age of Onset  . Diabetes Mother   . Hypertension Mother   . Diabetes Father   .  Hypertension Father   . Hypertension Brother   . Hypertension Maternal Grandmother   . Hypertension Maternal Grandfather    Social History:   reports that she has never smoked. She does not have any smokeless tobacco history on file. She reports that she drinks about 4.2 oz of alcohol per week. She reports that she does not use illicit drugs.   REVIEW OF SYSTEMS : Negative except for see problem list  Physical Exam:   not currently breastfeeding. There is no weight on file to calculate BMI.  Gen:  WDWN AAF NAD  Neurological: Alert and oriented to person, place, and time. Motor and sensory function is grossly intact  Head: Normocephalic and atraumatic.  Eyes: Conjunctivae are normal. Pupils are equal, round, and reactive to  light. No scleral icterus.  Neck: Normal range of motion. Neck supple. No tracheal deviation or thyromegaly present.  Cardiovascular:  SR without murmurs or gallops.  No carotid bruits Breast:  Not examined Respiratory: Effort normal.  No respiratory distress. No chest wall tenderness. Breath sounds normal.  No wheezes, rales or rhonchi.  Abdomen:  Prior c section incisions;  No upper abdominal incisions GU:  Not examined Musculoskeletal: Normal range of motion. Extremities are nontender. No cyanosis, edema or clubbing noted Lymphadenopathy: No cervical, preauricular, postauricular or axillary adenopathy is present Skin: Skin is warm and dry. No rash noted. No diaphoresis. No erythema. No pallor. Pscyh: Normal mood and affect. Behavior is normal. Judgment and thought content normal.   LABORATORY RESULTS: No results found for this or any previous visit (from the past 48 hour(s)).   RADIOLOGY RESULTS: No results found.  Problem List: Patient Active Problem List   Diagnosis Date Noted  . Obesity hypoventilation syndrome 12/18/2014  . Snoring 12/18/2014  . Asthma with acute exacerbation 12/18/2014  . Morbid obesity 11/17/2014  . HTN (hypertension) 11/17/2014  . Esophageal reflux 11/17/2014  . Bilateral edema of lower extremity 11/17/2014    Assessment & Plan: For sleeve gastrectomy on July 25th.  She will have UGI before then since she has GERD.      Matt B. Zaryia Markel, MD, FACS  [facility] [phone] beeper [phone]  06/08/2015 11:25 AM      

## 2015-06-25 NOTE — Transfer of Care (Signed)
Immediate Anesthesia Transfer of Care Note  Patient: Amber Daniels  Procedure(s) Performed: Procedure(s): LAPAROSCOPIC GASTRIC SLEEVE RESECTION (N/A)  Patient Location: PACU  Anesthesia Type:General  Level of Consciousness:  sedated, patient cooperative and responds to stimulation  Airway & Oxygen Therapy:Patient Spontanous Breathing and Patient connected to face mask oxgen  Post-op Assessment:  Report given to PACU RN and Post -op Vital signs reviewed and stable  Post vital signs:  Reviewed and stable  Last Vitals:  Filed Vitals:   06/25/15 0514  BP: 107/53  Pulse: 99  Temp: 36.7 C  Resp: 16    Complications: No apparent anesthesia complications

## 2015-06-25 NOTE — Interval H&P Note (Signed)
History and Physical Interval Note:  06/25/2015 7:07 AM  Amber Daniels  has presented today for surgery, with the diagnosis of MORBID OBESITY  The various methods of treatment have been discussed with the patient and family. After consideration of risks, benefits and other options for treatment, the patient has consented to  Procedure(s): LAPAROSCOPIC GASTRIC SLEEVE RESECTION (N/A) as a surgical intervention .  The patient's history has been reviewed, patient examined, no change in status, stable for surgery.  I have reviewed the patient's chart and labs. Her recent UGI showed no hiatus hernia.   Questions were answered to the patient's satisfaction.     Jack Mineau B

## 2015-06-25 NOTE — Anesthesia Postprocedure Evaluation (Signed)
  Anesthesia Post-op Note  Patient: Amber Daniels  Procedure(s) Performed: Procedure(s) (LRB): LAPAROSCOPIC GASTRIC SLEEVE RESECTION (N/A)  Patient Location: PACU  Anesthesia Type: General  Level of Consciousness: awake and alert   Airway and Oxygen Therapy: Patient Spontanous Breathing  Post-op Pain: mild  Post-op Assessment: Post-op Vital signs reviewed, Patient's Cardiovascular Status Stable, Respiratory Function Stable, Patent Airway and No signs of Nausea or vomiting. OSA precautions written for floor care.  Last Vitals:  Filed Vitals:   06/25/15 1127  BP: 121/59  Pulse: 86  Temp: 37 C  Resp: 17    Post-op Vital Signs: stable   Complications: No apparent anesthesia complications

## 2015-06-25 NOTE — Progress Notes (Signed)
Patient alert and oriented, op day.  Provided support and encouragement.  Encouraged pulmonary toilet and ambulation.  All questions answered.  Will continue to monitor. 

## 2015-06-25 NOTE — Op Note (Signed)
Name:  Amber Daniels MRN: [id number] Date of Surgery: 06/25/2015  Preop Diagnosis:  Morbid Obesity  Postop Diagnosis:  Morbid Obesity , S/P Gastric Sleeve  Procedure:  Upper endoscopy  (Intraoperative)  Surgeon:  Ovidio Kin, M.D.  Anesthesia:  GET  Indications for procedure: Amber Daniels is a 34 y.o. female whose primary care physician is LE, THAO PHUONG, DO and has completed a Gastric Sleeve today by Dr. Daphine Deutscher.  I am doing an intraoperative upper endoscopy to evaluate the gastric pouch.  Operative Note: The patient is under general anesthesia.  Dr. Daphine Deutscher is laparoscoping the patient while I do an upper endoscopy to evaluate the stomach pouch.  With the patient intubated, I passed the Pentax upper endoscope without difficulty down the esophagus.  The esophago-gastric junction was at 38 cm.    The mucosa of the stomach looked viable and the staple line was intact without bleeding.  I advanced to the pylorus, but did not go through it.  While I insufflated the stomach pouch with air, Dr. Daphine Deutscher  flooded the upper abdomen with saline to put the gastric pouch under saline.  There was no bubbling or evidence of a leak.  Photos were taken of the gastric pouch.  There was no evidence of narrowing of the pouch and the gastric sleeve looked tubular.  The scope was then withdrawn.  The esophagus was unremarkable and the patient tolerated the endoscopy without difficulty.  Ovidio Kin, MD, [facility] Surgery Pager: [phone] Office phone:  [phone]

## 2015-06-25 NOTE — Op Note (Signed)
Surgeon: Wenda Low, MD, FACS  Asst:  Ovidio Kin, MD, FACS  Anes:  General endotracheal  Procedure: Laparoscopic sleeve gastrectomy and upper endoscopy  Diagnosis: Morbid obesity  Complications: none  EBL:   minimal cc  Description of Procedure:  The patient was take to OR 4 and given general anesthesia.  The abdomen was prepped with PCMX and draped sterilely.  A timeout was performed.  Access to the abdomen was achieved with a 5 mm Optiview through the left upper quadant.  .  Following insufflation, the state of the abdomen was found to be with adhesions in the prior C section incision.  The ViSiGi 36Fr tube was inserted to deflate the stomach and was pulled back into the esophagus.    The pylorus was identified and we measured 5 cm back and marked the antrum.  At that point we began dissection to take down the greater curvature of the stomach using the Harmonic scalpel.  This dissection was taken all the way up to the left crus.  Posterior attachments of the stomach were also taken down.    The ViSiGi tube was then passed into the antrum and suction applied so that it was snug along the lessor curvature.  The "crow's foot" or incisura was identified.  The sleeve gastrectomy was begun using the [employer] stapler beginning with a 4.5 mm black Covidien load followed by a 6 cm black and then purple loads all with TRS reinforcement.  .  When the sleeve was complete the tube was taken off suction and insufflated briefly.  The tube was withdrawn.  Upper endoscopy was then performed by Dr. Ezzard Standing who demonstrated the sleeve and no bleeding or bubbles were noted.     The specimen was extracted through the 15 trocar site.  Wounds were infiltrated with Exparel and closed with 4-0 vicryl.  The 15 mm port had an endoclose 0 vicryl.    Matt B. Daphine Deutscher, MD, [facility], [place] [phone]

## 2015-06-25 NOTE — Anesthesia Procedure Notes (Addendum)
Procedure Name: Intubation Date/Time: 06/25/2015 7:25 AM Performed by: Paris Lore Pre-anesthesia Checklist: Patient identified, Emergency Drugs available, Suction available and Patient being monitored Patient Re-evaluated:Patient Re-evaluated prior to inductionOxygen Delivery Method: Circle system utilized Preoxygenation: Pre-oxygenation with 100% oxygen Intubation Type: IV induction Ventilation: Oral airway inserted - appropriate to patient size Laryngoscope Size: Mac and 4 Grade View: Grade II Tube type: Oral Tube size: 7.5 mm Number of attempts: 1 Airway Equipment and Method: Patient positioned with wedge pillow and Stylet Placement Confirmation: positive ETCO2,  CO2 detector and breath sounds checked- equal and bilateral Secured at: 21 cm Tube secured with: Tape Dental Injury: Teeth and Oropharynx as per pre-operative assessment  Comments: Performed per [facility]

## 2015-06-25 NOTE — Anesthesia Preprocedure Evaluation (Addendum)
Anesthesia Evaluation  Patient identified by MRN, date of birth, ID band Patient awake    Reviewed: Allergy & Precautions, NPO status , Patient's Chart, lab work & pertinent test results  History of Anesthesia Complications (+) PONV and history of anesthetic complications  Airway Mallampati: II  TM Distance: >3 FB Neck ROM: Full    Dental no notable dental hx.    Pulmonary shortness of breath, asthma , sleep apnea ,  breath sounds clear to auscultation  Pulmonary exam normal       Cardiovascular hypertension, Pt. on medications and Pt. on home beta blockers Normal cardiovascular examRhythm:Regular Rate:Normal     Neuro/Psych  Headaches, negative psych ROS   GI/Hepatic Neg liver ROS, GERD-  ,  Endo/Other  Morbid obesity  Renal/GU negative Renal ROS  negative genitourinary   Musculoskeletal  (+) Arthritis -,   Abdominal (+) + obese,   Peds negative pediatric ROS (+)  Hematology  (+) anemia ,   Anesthesia Other Findings   Reproductive/Obstetrics negative OB ROS                             Anesthesia Physical Anesthesia Plan  ASA: III  Anesthesia Plan: General   Post-op Pain Management:    Induction: Intravenous  Airway Management Planned: Oral ETT  Additional Equipment:   Intra-op Plan:   Post-operative Plan: Extubation in OR and Possible Post-op intubation/ventilation  Informed Consent: I have reviewed the patients History and Physical, chart, labs and discussed the procedure including the risks, benefits and alternatives for the proposed anesthesia with the patient or authorized representative who has indicated his/her understanding and acceptance.   Dental advisory given  Plan Discussed with: CRNA  Anesthesia Plan Comments:         Anesthesia Quick Evaluation

## 2015-06-26 ENCOUNTER — Encounter (HOSPITAL_COMMUNITY): Payer: Self-pay | Admitting: Surgery

## 2015-06-26 ENCOUNTER — Ambulatory Visit: Payer: 59

## 2015-06-26 ENCOUNTER — Inpatient Hospital Stay (HOSPITAL_COMMUNITY): Payer: 59

## 2015-06-26 LAB — CBC WITH DIFFERENTIAL/PLATELET
Basophils Absolute: 0 10*3/uL (ref 0.0–0.1)
Basophils Relative: 0 % (ref 0–1)
EOS ABS: 0 10*3/uL (ref 0.0–0.7)
Eosinophils Relative: 0 % (ref 0–5)
HCT: 35.6 % — ABNORMAL LOW (ref 36.0–46.0)
HEMOGLOBIN: 10.8 g/dL — AB (ref 12.0–15.0)
LYMPHS PCT: 9 % — AB (ref 12–46)
Lymphs Abs: 1.3 10*3/uL (ref 0.7–4.0)
MCH: 25.1 pg — ABNORMAL LOW (ref 26.0–34.0)
MCHC: 30.3 g/dL (ref 30.0–36.0)
MCV: 82.6 fL (ref 78.0–100.0)
MONO ABS: 0.9 10*3/uL (ref 0.1–1.0)
MONOS PCT: 7 % (ref 3–12)
Neutro Abs: 11.2 10*3/uL — ABNORMAL HIGH (ref 1.7–7.7)
Neutrophils Relative %: 84 % — ABNORMAL HIGH (ref 43–77)
Platelets: 269 10*3/uL (ref 150–400)
RBC: 4.31 MIL/uL (ref 3.87–5.11)
RDW: 16.8 % — ABNORMAL HIGH (ref 11.5–15.5)
WBC: 13.3 10*3/uL — ABNORMAL HIGH (ref 4.0–10.5)

## 2015-06-26 LAB — HEMOGLOBIN AND HEMATOCRIT, BLOOD
HCT: 34 % — ABNORMAL LOW (ref 36.0–46.0)
Hemoglobin: 10.5 g/dL — ABNORMAL LOW (ref 12.0–15.0)

## 2015-06-26 MED ORDER — PROMETHAZINE HCL 25 MG/ML IJ SOLN
12.5000 mg | Freq: Four times a day (QID) | INTRAMUSCULAR | Status: DC | PRN
  Administered 2015-06-26 – 2015-06-27 (×3): 12.5 mg via INTRAVENOUS
  Filled 2015-06-26 (×3): qty 1

## 2015-06-26 MED ORDER — IOHEXOL 300 MG/ML  SOLN
50.0000 mL | Freq: Once | INTRAMUSCULAR | Status: AC | PRN
  Administered 2015-06-26: 30 mL via ORAL

## 2015-06-26 MED ORDER — TISSEEL VH 10 ML EX KIT
PACK | CUTANEOUS | Status: AC
  Filled 2015-06-26: qty 1

## 2015-06-26 MED ORDER — IOHEXOL 300 MG/ML  SOLN: 50.0000 mL | Freq: Once | INTRAMUSCULAR | Status: AC | PRN

## 2015-06-26 MED ORDER — SODIUM CHLORIDE 0.9 % IJ SOLN
INTRAMUSCULAR | Status: AC
  Filled 2015-06-26: qty 10

## 2015-06-26 NOTE — Progress Notes (Signed)
Nutrition Brief Note  RD consulted for post-op bariatric surgery diet education.  RD attempted education with patient but pt stated that she was feeling very nauseous and requested that RD return at a later time. Pt stated that RN was aware and she has been given medication for nausea.  RD to follow-up for education needs 7/27.  Tilda Franco, MS, RD, LDN Pager: [phone] After Hours Pager: [phone]

## 2015-06-26 NOTE — Care Management Note (Signed)
Case Management Note  Patient Details  Name: Amber Daniels MRN: [id number] Date of Birth: 05-Oct-1981  Subjective/Objective:                   Gastric Sleeve Action/Plan: Utilization Completed  Expected Discharge Date:                  Expected Discharge Plan:  Home/Self Care  In-House Referral:     Discharge planning Services  CM Consult  Post Acute Care Choice:    Choice offered to:     DME Arranged:    DME Agency:     HH Arranged:    HH Agency:     Status of Service:  Completed, signed off  Medicare Important Message Given:    Date Medicare IM Given:    Medicare IM give by:    Date Additional Medicare IM Given:    Additional Medicare Important Message give by:     If discussed at Long Length of Stay Meetings, dates discussed:    Additional Comments: Pt ind with all ADLs and no HH services anticipated.  No other CM needs were communicated. Yves Dill, RN 06/26/2015, 3:03 PM

## 2015-06-26 NOTE — Progress Notes (Signed)
Patient alert and oriented, Post op day 1.  Provided support and encouragement.  Encouraged pulmonary toilet, ambulation and small sips of liquids when swallow study returned satisfactorily.  All questions answered.  Will continue to monitor. 

## 2015-06-27 DIAGNOSIS — Z9884 Bariatric surgery status: Secondary | ICD-10-CM

## 2015-06-27 LAB — CBC WITH DIFFERENTIAL/PLATELET
BASOS ABS: 0 10*3/uL (ref 0.0–0.1)
BASOS PCT: 0 % (ref 0–1)
EOS ABS: 0.1 10*3/uL (ref 0.0–0.7)
Eosinophils Relative: 1 % (ref 0–5)
HCT: 34.2 % — ABNORMAL LOW (ref 36.0–46.0)
Hemoglobin: 10.1 g/dL — ABNORMAL LOW (ref 12.0–15.0)
Lymphocytes Relative: 22 % (ref 12–46)
Lymphs Abs: 2 10*3/uL (ref 0.7–4.0)
MCH: 24.3 pg — ABNORMAL LOW (ref 26.0–34.0)
MCHC: 29.5 g/dL — AB (ref 30.0–36.0)
MCV: 82.2 fL (ref 78.0–100.0)
MONOS PCT: 7 % (ref 3–12)
Monocytes Absolute: 0.7 10*3/uL (ref 0.1–1.0)
NEUTROS ABS: 6.6 10*3/uL (ref 1.7–7.7)
Neutrophils Relative %: 70 % (ref 43–77)
Platelets: 218 10*3/uL (ref 150–400)
RBC: 4.16 MIL/uL (ref 3.87–5.11)
RDW: 16.6 % — AB (ref 11.5–15.5)
WBC: 9.4 10*3/uL (ref 4.0–10.5)

## 2015-06-27 NOTE — Progress Notes (Signed)
Patient is alert and oriented, vital signs are stable, incisions are within normal limits, patient is now tolerating her chicken soup shake and oral pain medication without nausea or vomiting, patient to follow up with [facility] Breed RN 06-27-2015 14:31pm

## 2015-06-27 NOTE — Discharge Instructions (Signed)

## 2015-06-27 NOTE — Discharge Summary (Signed)
Physician Discharge Summary  Patient ID: Amber Daniels MRN: [id number] DOB/AGE: 1981/07/16 34 y.o.  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  Swallow on PD 1 OK. Begun on liquids and advanced to PD 2 liquid diet.  Ready for discharge on PD 2  Consults: none  Significant Diagnostic Studies: UGI    Discharge Exam: Blood pressure 130/60, pulse 60, temperature 98.8 F (37.1 C), temperature source Oral, resp. rate 20, height 5\' 3"  (1.6 m), weight 157.965 kg (348 lb 4 oz), last menstrual period 06/09/2015, SpO2 100 %, not currently breastfeeding. Improved.  No complaints; incisions ok.   Disposition: 01-Home or Self Care  Discharge Instructions    Ambulate hourly while awake    Complete by:  As directed      Call MD for:  difficulty breathing, headache or visual disturbances    Complete by:  As directed      Call MD for:  persistant dizziness or light-headedness    Complete by:  As directed      Call MD for:  persistant nausea and vomiting    Complete by:  As directed      Call MD for:  redness, tenderness, or signs of infection (pain, swelling, redness, odor or green/yellow discharge around incision site)    Complete by:  As directed      Call MD for:  severe uncontrolled pain    Complete by:  As directed      Call MD for:  temperature >101 F    Complete by:  As directed      Diet bariatric full liquid    Complete by:  As directed      Incentive spirometry    Complete by:  As directed   Perform hourly while awake            Medication List    TAKE these medications        acetaminophen 500 MG tablet  Commonly known as:  TYLENOL  Take 1,500 mg by mouth every 6 (six) hours as  needed. For pain.     albuterol 108 (90 BASE) MCG/ACT inhaler  Commonly known as:  PROVENTIL HFA;VENTOLIN HFA  Inhale 2 puffs into the lungs every 4 (four) hours as needed for wheezing or shortness of breath.     beclomethasone 40 MCG/ACT inhaler  Commonly known as:  QVAR  Inhale 2 puffs into the lungs daily.     cetirizine 10 MG tablet  Commonly known as:  ZYRTEC  Take 1 tablet (10 mg total) by mouth daily.     chlorthalidone 25 MG tablet  Commonly known as:  HYGROTON  Take 1 tablet (25 mg total) by mouth daily.  Notes to Patient:  Monitor Blood Pressure Daily and keep a log for primary care physician.  Monitor for symptoms of dehydration.  You may need to make changes to your medications with rapid weight loss.       cyclobenzaprine 5 MG tablet  Commonly known as:  FLEXERIL  Take 1 tablet (5 mg total) by mouth 3 (three) times daily as needed for muscle spasms.     fexofenadine 60 MG tablet  Commonly known as:  ALLEGRA  Take 60 mg by mouth daily. As needed  FLUOCINOLONE ACETONIDE SCALP 0.01 % Oil  Use 1 oz on scalp and work through scalp for 5 min daily  and then thoroughly rinse with water     HYDROcodone-acetaminophen 5-325 MG per tablet  Commonly known as:  NORCO/VICODIN  Take 2 tablets by mouth every 4 (four) hours as needed.     ibuprofen 600 MG tablet  Commonly known as:  ADVIL,MOTRIN  Take 1 tablet (600 mg total) by mouth every 6 (six) hours as needed for mild pain.  Notes to Patient:  Avoid NSAIDs for 6-8 weeks after surgery     ketoconazole 2 % shampoo  Commonly known as:  NIZORAL  Apply 1 application topically 2 (two) times a week. Lather affected eareas, leave on for 5 min then rinse.     labetalol 100 MG tablet  Commonly known as:  NORMODYNE  Take 1 tablet (100 mg total) by mouth 2 (two) times daily.  Notes to Patient:  Monitor Blood Pressure Daily and keep a log for primary care physician.  You may need to make changes to your medications with rapid  weight loss.       montelukast 10 MG tablet  Commonly known as:  SINGULAIR  Take 1 tablet (10 mg total) by mouth at bedtime.     naproxen 500 MG tablet  Commonly known as:  NAPROSYN  Take 1 tablet (500 mg total) by mouth 2 (two) times daily with a meal.  Notes to Patient:  Avoid NSAIDs for 6-8 weeks after surgery      NOREL AD 4-10-325 MG Tabs  Generic drug:  Chlorphen-PE-Acetaminophen  Take 1 tablet by mouth 4 (four) times daily. As needed     ondansetron 4 MG disintegrating tablet  Commonly known as:  ZOFRAN ODT  Take 1 tablet (4 mg total) by mouth every 8 (eight) hours as needed for nausea.     triamcinolone 55 MCG/ACT Aero nasal inhaler  Commonly known as:  NASACORT ALLERGY 24HR  Place 2 sprays into the nose daily.           Follow-up Information    Follow up with Valarie Merino, MD. Go on 07/05/2015.   Specialty:  General Surgery   Why:  For Post-Op Check at 10:45 AM   Contact information:   [address] [place] [address] [phone]       Signed: Valarie Merino 06/27/2015, 12:50 PM

## 2015-06-27 NOTE — Progress Notes (Signed)
Patient alert and oriented, pain is controlled. Patient is tolerating fluids,  advanced to protein shake today, patient tolerated well.  Reviewed Gastric sleeve discharge instructions with patient and patient is able to articulate understanding.  Provided information on BELT program, Support Group and [facility] outpatient pharmacy. All questions answered, will continue to monitor.  

## 2015-06-27 NOTE — Plan of Care (Signed)
Problem: Food- and Nutrition-Related Knowledge Deficit (NB-1.1) Goal: Nutrition education Formal process to instruct or train a patient/client in a skill or to impart knowledge to help patients/clients voluntarily manage or modify food choices and eating behavior to maintain or improve health. Outcome: Completed/Met Date Met:  06/27/15 Nutrition Education Note  Received consult for diet education per DROP protocol.   Diet reviewed by Bariatric nurse coordinator. Pt with no questions at this time for RD. Pt reports feeling much better today.    Diet: First 2 Weeks  You will see the nutritionist about two (2) weeks after your surgery. The nutritionist will increase the types of foods you can eat if you are handling liquids well:  If you have severe vomiting or nausea and cannot handle clear liquids lasting longer than 1 day, call your surgeon  Protein Shake  Drink at least 2 ounces of shake 5-6 times per day  Each serving of protein shakes (usually 8 - 12 ounces) should have a minimum of:  15 grams of protein  And no more than 5 grams of carbohydrate  Goal for protein each day:  Men = 80 grams per day  Women = 60 grams per day  Protein powder may be added to fluids such as non-fat milk or Lactaid milk or Soy milk (limit to 35 grams added protein powder per serving)   Hydration  Slowly increase the amount of water and other clear liquids as tolerated (See Acceptable Fluids)  Slowly increase the amount of protein shake as tolerated  Sip fluids slowly and throughout the day  May use sugar substitutes in small amounts (no more than 6 - 8 packets per day; i.e. Splenda)   Fluid Goal  The first goal is to drink at least 8 ounces of protein shake/drink per day (or as directed by the nutritionist); some examples of protein shakes are [employer], [employer], EAS Edge HP, and Unjury. See handout from pre-op Bariatric Education Class:  Slowly increase the amount of protein shake you  drink as tolerated  You may find it easier to slowly sip shakes throughout the day  It is important to get your proteins in first  Your fluid goal is to drink 64 - 100 ounces of fluid daily  It may take a few weeks to build up to this  32 oz (or more) should be clear liquids  And  32 oz (or more) should be full liquids (see below for examples)  Liquids should not contain sugar, caffeine, or carbonation   Clear Liquids:  Water or Sugar-free flavored water (i.e. Fruit H2O, Propel)  Decaffeinated coffee or tea (sugar-free)  Crystal Lite, Wyler's Lite, Minute Maid Lite  Sugar-free Jell-O  Bouillon or broth  Sugar-free Popsicle: *Less than 20 calories each; Limit 1 per day   Full Liquids:  Protein Shakes/Drinks + 2 choices per day of other full liquids  Full liquids must be:  No More Than 12 grams of Carbs per serving  No More Than 3 grams of Fat per serving  Strained low-fat cream soup  Non-Fat milk  Fat-free Lactaid Milk  Sugar-free yogurt (Dannon Lite & Fit, Greek yogurt)     Clayton Bibles, MS, RD, LDN Pager: [phone] After Hours Pager: [phone]

## 2015-07-04 ENCOUNTER — Telehealth (HOSPITAL_COMMUNITY): Payer: Self-pay

## 2015-07-04 NOTE — Telephone Encounter (Signed)

## 2015-07-09 ENCOUNTER — Telehealth: Payer: Self-pay

## 2015-07-09 NOTE — Telephone Encounter (Signed)
Patient is calling because needs to be seen for a hospital follow up. She normally sees Dr. Conley Rolls but does not like coming to the walk in center. I advised that she could some at 8 in the morning or I can give her an appointment with another provider. Patient rather Dr. Conley Rolls giver her a call to suggest which provider she can see at the appointment center. Please call patient and advise. [phone]

## 2015-07-10 ENCOUNTER — Encounter: Payer: 59 | Attending: Surgery

## 2015-07-10 DIAGNOSIS — Z01818 Encounter for other preprocedural examination: Secondary | ICD-10-CM | POA: Insufficient documentation

## 2015-07-10 DIAGNOSIS — Z6841 Body Mass Index (BMI) 40.0 and over, adult: Secondary | ICD-10-CM | POA: Insufficient documentation

## 2015-07-10 DIAGNOSIS — Z713 Dietary counseling and surveillance: Secondary | ICD-10-CM | POA: Insufficient documentation

## 2015-07-10 NOTE — Progress Notes (Signed)
  Bariatric Class:  Appt start time: 1530 end time:  1630.  2 Week Post-Operative Nutrition Class  Patient was seen on 07/10/15 for Post-Operative Nutrition education at [facility].   Surgery date: 06/25/15 Surgery type: Sleeve Gastrectomy Start weight at [facility]: 356 lbs on 02/03/15, 362 lbs on 06/11/15 Weight today: 333.5 lbs  Weight change: 28.5 lbs  TANITA  BODY COMP RESULTS  06/11/15 07/10/15   BMI (kg/m^2) 64.1 59.1   Fat Mass (lbs) 193.5 183.0   Fat Free Mass (lbs) 168.5 150.5   Total Body Water (lbs) 123.5 110.0    The following the learning objectives were met by the patient during this course:  Identifies Phase 3A (Soft, High Proteins) Dietary Goals and will begin from 2 weeks post-operatively to 2 months post-operatively  Identifies appropriate sources of fluids and proteins   States protein recommendations and appropriate sources post-operatively  Identifies the need for appropriate texture modifications, mastication, and bite sizes when consuming solids  Identifies appropriate multivitamin and calcium sources post-operatively  Describes the need for physical activity post-operatively and will follow MD recommendations  States when to call healthcare provider regarding medication questions or post-operative complications  Handouts given during class include:  Phase 3A: Soft, High Protein Diet Handout  Follow-Up Plan: Patient will follow-up at [facility] in 6 weeks for 2 month post-op nutrition visit for diet advancement per MD.

## 2015-07-11 NOTE — Telephone Encounter (Signed)
LM with names of PAs who are in 104

## 2015-08-22 ENCOUNTER — Encounter: Payer: Self-pay | Admitting: Dietician

## 2015-08-22 ENCOUNTER — Encounter: Payer: BC Managed Care – PPO | Attending: Surgery | Admitting: Dietician

## 2015-08-22 DIAGNOSIS — Z01818 Encounter for other preprocedural examination: Secondary | ICD-10-CM | POA: Insufficient documentation

## 2015-08-22 DIAGNOSIS — Z713 Dietary counseling and surveillance: Secondary | ICD-10-CM | POA: Insufficient documentation

## 2015-08-22 DIAGNOSIS — Z6841 Body Mass Index (BMI) 40.0 and over, adult: Secondary | ICD-10-CM | POA: Insufficient documentation

## 2015-08-22 NOTE — Progress Notes (Signed)
  Follow-up visit:  8 Weeks Post-Operative Sleeve Gastrectomy Surgery  Medical Nutrition Therapy:  Appt start time: 445 end time:  515  Primary concerns today: Post-operative Bariatric Surgery Nutrition Management. Amber Daniels returns today having lost 15 pounds since pre op class. She states she can tolerate almost 3 oz of meat. Tolerating most foods except beef.  Surgery date: 06/25/15 Surgery type: Sleeve Gastrectomy [facility] weight at [facility]: 356 lbs on 02/03/15, 362 lbs on 06/11/15 Weight today: 318.5 lbs Weight change: 15 lbs Total weight lost: 37.5 lbs (43.5 lbs)  TANITA  BODY COMP RESULTS  06/11/15 07/10/15 08/22/15   BMI (kg/m^2) 64.1 59.1 56.4   Fat Mass (lbs) 193.5 183.0 170   Fat Free Mass (lbs) 168.5 150.5 148   Total Body Water (lbs) 123.5 110.0 108.5    Preferred Learning Style:   No preference indicated   Learning Readiness:   Ready  24-hr recall: B (AM): protein shake (16g) Snk (10AM): ham and cheese rollup (14g)  L (PM): crabmeat or yogurt (12g) Snk (PM): Nutrigrain bar  D (PM): rotisserie chicken and collard greens (21g) Snk (PM): none  Fluid intake: patient states she is getting in 64 oz of fluid Estimated total protein intake: 63 grams per day  Medications: see list Supplementation: taking  Using straws: no Drinking while eating: no Hair loss: no Carbonated beverages: no N/V/D/C: vomiting with chicken wings and meatloaf; constipation, sometimes takes Miralax Dumping syndrome: no  Recent physical activity:  Walking all day; none outside of normal ADLs  Progress Towards Goal(s):  In progress.  Handouts given during visit include:  Phase 3B lean protein + non starchy vegetables   Nutritional Diagnosis:  Franquez-3.3 Overweight/obesity related to past poor dietary habits and physical inactivity as evidenced by patient w/ recent sleeve gastrectomy surgery following dietary guidelines for continued weight loss.     Intervention:  Nutrition counseling  provided.  Teaching Method Utilized:  Visual Auditory Hands on  Barriers to learning/adherence to lifestyle change: none  Demonstrated degree of understanding via:  Teach Back   Monitoring/Evaluation:  Dietary intake, exercise, and body weight. Follow up in 2 months for 4 month post-op visit.

## 2015-08-22 NOTE — Patient Instructions (Addendum)
Goals:  Follow Phase 3B: High Protein + Non-Starchy Vegetables  Eat 3-6 small meals/snacks, every 3-5 hrs  Increase lean protein foods to meet 60g goal  Increase fluid intake to 64oz +  Avoid drinking 15 minutes before, during and 30 minutes after eating  Aim for >30 min of physical activity daily  Stick with a very small amount of nuts (12-15)  Try a veggie spiralizer  Have a high protein snack in the afternoons (yogurt, meat and cheese rollup, boiled egg, beef jerky)   TANITA  BODY COMP RESULTS  06/11/15 07/10/15 08/22/15   BMI (kg/m^2) 64.1 59.1 56.4   Fat Mass (lbs) 193.5 183.0 170   Fat Free Mass (lbs) 168.5 150.5 148   Total Body Water (lbs) 123.5 110.0 108.5

## 2015-10-31 ENCOUNTER — Ambulatory Visit: Payer: 59 | Admitting: Dietician

## 2015-11-08 ENCOUNTER — Other Ambulatory Visit: Payer: Self-pay

## 2015-11-08 DIAGNOSIS — L219 Seborrheic dermatitis, unspecified: Secondary | ICD-10-CM

## 2015-11-08 MED ORDER — FLUOCINOLONE ACETONIDE SCALP 0.01 % EX OIL: TOPICAL_OIL | CUTANEOUS | Status: DC

## 2016-12-29 ENCOUNTER — Ambulatory Visit (INDEPENDENT_AMBULATORY_CARE_PROVIDER_SITE_OTHER): Payer: BC Managed Care – PPO | Admitting: Urgent Care

## 2016-12-29 VITALS — BP 114/84 | HR 77 | Temp 98.7°F | Resp 16 | Ht 63.0 in | Wt 291.0 lb

## 2016-12-29 DIAGNOSIS — J3489 Other specified disorders of nose and nasal sinuses: Secondary | ICD-10-CM | POA: Diagnosis not present

## 2016-12-29 DIAGNOSIS — R05 Cough: Secondary | ICD-10-CM | POA: Diagnosis not present

## 2016-12-29 DIAGNOSIS — J4540 Moderate persistent asthma, uncomplicated: Secondary | ICD-10-CM

## 2016-12-29 DIAGNOSIS — J019 Acute sinusitis, unspecified: Secondary | ICD-10-CM | POA: Diagnosis not present

## 2016-12-29 DIAGNOSIS — R0789 Other chest pain: Secondary | ICD-10-CM

## 2016-12-29 DIAGNOSIS — R059 Cough, unspecified: Secondary | ICD-10-CM

## 2016-12-29 DIAGNOSIS — J302 Other seasonal allergic rhinitis: Secondary | ICD-10-CM | POA: Diagnosis not present

## 2016-12-29 MED ORDER — MONTELUKAST SODIUM 10 MG PO TABS: 10.0000 mg | ORAL_TABLET | Freq: Every day | ORAL | 0 refills | Status: DC

## 2016-12-29 MED ORDER — METHYLPREDNISOLONE ACETATE 80 MG/ML IJ SUSP
80.0000 mg | Freq: Once | INTRAMUSCULAR | Status: AC
Start: 2016-12-29 — End: 2016-12-29
  Administered 2016-12-29: 80 mg via INTRAMUSCULAR

## 2016-12-29 MED ORDER — PSEUDOEPHEDRINE HCL ER 120 MG PO TB12: 120.0000 mg | ORAL_TABLET | Freq: Two times a day (BID) | ORAL | 3 refills | Status: DC

## 2016-12-29 MED ORDER — AMOXICILLIN 500 MG PO CAPS: 500.0000 mg | ORAL_CAPSULE | Freq: Two times a day (BID) | ORAL | 0 refills | Status: DC

## 2016-12-29 MED ORDER — HYDROCOD POLST-CPM POLST ER 10-8 MG/5ML PO SUER: 5.0000 mL | Freq: Every evening | ORAL | 0 refills | Status: DC | PRN

## 2016-12-29 MED ORDER — ALBUTEROL SULFATE HFA 108 (90 BASE) MCG/ACT IN AERS: 2.0000 | INHALATION_SPRAY | Freq: Four times a day (QID) | RESPIRATORY_TRACT | 5 refills | Status: DC | PRN

## 2016-12-29 MED ORDER — BECLOMETHASONE DIPROPIONATE 40 MCG/ACT IN AERS: 2.0000 | INHALATION_SPRAY | Freq: Every day | RESPIRATORY_TRACT | 1 refills | Status: DC

## 2016-12-29 MED ORDER — BENZONATATE 100 MG PO CAPS: 100.0000 mg | ORAL_CAPSULE | Freq: Three times a day (TID) | ORAL | 0 refills | Status: DC | PRN

## 2016-12-29 NOTE — Progress Notes (Signed)
  MRN: [id number] DOB: 03/17/1981  Subjective:   Amber Daniels is a 36 y.o. female presenting for chief complaint of Cough (symptoms started 2 wks ago ); sneezing; Headache; and Sore Throat  Reports 2 week history of productive cough with hemoptysis. Cough elicits headache, chest pain, shob, wheezing and lightheadedness. Has associated sore throat, itchy and runny eyes, nasal congestion, sinus pain, body aches. Denies fever, ear pain, ear drainage, n/v, abdominal pain, rashes. Uses QVAR inconsistently, uses albuterol twice daily now that she's been ill. Uses Singulair daily. Uses Allegra, Zyrtec, Norel for allergies alternating between them. Denies smoking cigarettes.   Amber Daniels has a current medication list which includes the following prescription(s): acetaminophen, albuterol, beclomethasone, cetirizine, cyclobenzaprine, fexofenadine, fluocinolone acetonide scalp, hydrocodone-acetaminophen, ibuprofen, ketoconazole, montelukast, naproxen, norel ad, ondansetron, triamcinolone, chlorthalidone, and labetalol. Also is allergic to other.  Amber Daniels  has a past medical history of Allergy; Anemia; Arthritis; Asthma; Edema; GERD (gastroesophageal reflux disease); Headache; Heart murmur; Hypertension; Morbid obesity (HCC); PONV (postoperative nausea and vomiting); Postpartum care following cesarean delivery (12/2) (11/01/2013); Shortness of breath dyspnea; and Sleep apnea. Also  has a past surgical history that includes Cesarean section; Cesarean section (N/A, 11/01/2013); Breath tek h pylori (N/A, 02/22/2015); and Laparoscopic gastric sleeve resection (N/A, 06/25/2015).  Objective:   Vitals: BP 114/84   Pulse 77   Temp 98.7 F (37.1 C) (Oral)   Resp 16   Ht 5\' 3"  (1.6 m)   Wt 291 lb (132 kg)   SpO2 100%   BMI 51.55 kg/m   Physical Exam  Constitutional: She is oriented to person, place, and time. She appears well-developed and well-nourished.  HENT:  TM's intact bilaterally, no effusions or  erythema. Nasal turbinates erythematous and edematous, nasal passages minimally patent. Bilateral maxillary and frontal sinus tenderness. Patches of hyperpigmentation of her lips without tenderness or swelling. Oropharynx with moderate post-nasal drainage, mucous membranes moist, dentition in good repair.  Eyes: Right eye exhibits discharge (clear). Left eye exhibits discharge (clear). No scleral icterus.  Neck: Normal range of motion. Neck supple.  Cardiovascular: Normal rate, regular rhythm and intact distal pulses.  Exam reveals no gallop and no friction rub.   No murmur heard. Pulmonary/Chest: No respiratory distress. She has no wheezes. She has no rales.  Lymphadenopathy:    She has cervical adenopathy (bilateral).  Neurological: She is alert and oriented to person, place, and time.  Skin: Skin is warm and dry.   Assessment and Plan :   1. Acute sinusitis, recurrence not specified, unspecified location 2. Sinus pain 3. Cough 4. Atypical chest pain - Will cover for bacterial sinusitis. Start amoxicillin. Use supportive care otherwise. RTC in 1 week if no improvement.  5. Moderate persistent asthma without complication 6. Chronic seasonal allergic rhinitis due to other allergen - Refilled medications for asthma, chronic allergies. Follow up in 4 weeks for recheck on QVAR. Consider flu vaccine at that time if her symptoms persist.  Wallis BambergMario Markesha Hannig, PA-C [facility] at [facility] [phone] 12/29/2016  3:06 PM

## 2016-12-29 NOTE — Patient Instructions (Addendum)
 Sinusitis, Adult Sinusitis is soreness and inflammation of your sinuses. Sinuses are hollow spaces in the bones around your face. Your sinuses are located:  Around your eyes.  In the middle of your forehead.  Behind your nose.  In your cheekbones.  Your sinuses and nasal passages are lined with a stringy fluid (mucus). Mucus normally drains out of your sinuses. When your nasal tissues become inflamed or swollen, the mucus can become trapped or blocked so air cannot flow through your sinuses. This allows bacteria, viruses, and funguses to grow, which leads to infection. Sinusitis can develop quickly and last for 7?10 days (acute) or for more than 12 weeks (chronic). Sinusitis often develops after a cold. What are the causes? This condition is caused by anything that creates swelling in the sinuses or stops mucus from draining, including:  Allergies.  Asthma.  Bacterial or viral infection.  Abnormally shaped bones between the nasal passages.  Nasal growths that contain mucus (nasal polyps).  Narrow sinus openings.  Pollutants, such as chemicals or irritants in the air.  A foreign object stuck in the nose.  A fungal infection. This is rare.  What increases the risk? The following factors may make you more likely to develop this condition:  Having allergies or asthma.  Having had a recent cold or respiratory tract infection.  Having structural deformities or blockages in your nose or sinuses.  Having a weak immune system.  Doing a lot of swimming or diving.  Overusing nasal sprays.  Smoking.  What are the signs or symptoms? The main symptoms of this condition are pain and a feeling of pressure around the affected sinuses. Other symptoms include:  Upper toothache.  Earache.  Headache.  Bad breath.  Decreased sense of smell and taste.  A cough that may get worse at night.  Fatigue.  Fever.  Thick drainage from your nose. The drainage is often green  and it may contain pus (purulent).  Stuffy nose or congestion.  Postnasal drip. This is when extra mucus collects in the throat or back of the nose.  Swelling and warmth over the affected sinuses.  Sore throat.  Sensitivity to light.  How is this diagnosed? This condition is diagnosed based on symptoms, a medical history, and a physical exam. To find out if your condition is acute or chronic, your health care provider may:  Look in your nose for signs of nasal polyps.  Tap over the affected sinus to check for signs of infection.  View the inside of your sinuses using an imaging device that has a light attached (endoscope).  If your health care provider suspects that you have chronic sinusitis, you may also:  Be tested for allergies.  Have a sample of mucus taken from your nose (nasal culture) and checked for bacteria.  Have a mucus sample examined to see if your sinusitis is related to an allergy.  If your sinusitis does not respond to treatment and it lasts longer than 8 weeks, you may have an MRI or CT scan to check your sinuses. These scans also help to determine how severe your infection is. In rare cases, a bone biopsy may be done to rule out more serious types of fungal sinus disease. How is this treated? Treatment for sinusitis depends on the cause and whether your condition is chronic or acute. If a virus is causing your sinusitis, your symptoms will go away on their own within 10 days. You may be given medicines to relieve your   symptoms, including:  Topical nasal decongestants. They shrink swollen nasal passages and let mucus drain from your sinuses.  Antihistamines. These drugs block inflammation that is triggered by allergies. This can help to ease swelling in your nose and sinuses.  Topical nasal corticosteroids. These are nasal sprays that ease inflammation and swelling in your nose and sinuses.  Nasal saline washes. These rinses can help to get rid of thick mucus  in your nose.  If your condition is caused by bacteria, you will be given an antibiotic medicine. If your condition is caused by a fungus, you will be given an antifungal medicine. Surgery may be needed to correct underlying conditions, such as narrow nasal passages. Surgery may also be needed to remove polyps. Follow these instructions at home: Medicines  Take, use, or apply over-the-counter and prescription medicines only as told by your health care provider. These may include nasal sprays.  If you were prescribed an antibiotic medicine, take it as told by your health care provider. Do not stop taking the antibiotic even if you start to feel better. Hydrate and Humidify  Drink enough water to keep your urine clear or pale yellow. Staying hydrated will help to thin your mucus.  Use a cool mist humidifier to keep the humidity level in your home above 50%.  Inhale steam for 10-15 minutes, 3-4 times a day or as told by your health care provider. You can do this in the bathroom while a hot shower is running.  Limit your exposure to cool or dry air. Rest  Rest as much as possible.  Sleep with your head raised (elevated).  Make sure to get enough sleep each night. General instructions  Apply a warm, moist washcloth to your face 3-4 times a day or as told by your health care provider. This will help with discomfort.  Wash your hands often with soap and water to reduce your exposure to viruses and other germs. If soap and water are not available, use hand sanitizer.  Do not smoke. Avoid being around people who are smoking (secondhand smoke).  Keep all follow-up visits as told by your health care provider. This is important. Contact a health care provider if:  You have a fever.  Your symptoms get worse.  Your symptoms do not improve within 10 days. Get help right away if:  You have a severe headache.  You have persistent vomiting.  You have pain or swelling around your face or  eyes.  You have vision problems.  You develop confusion.  Your neck is stiff.  You have trouble breathing. This information is not intended to replace advice given to you by your health care provider. Make sure you discuss any questions you have with your health care provider. Document Released: 11/17/2005 Document Revised: 07/13/2016 Document Reviewed: 09/12/2015 Elsevier Interactive Patient Education  2017 [employer]    IF you received an x-ray today, you will receive an invoice from [facility]. Please contact [facility] at [phone] with questions or concerns regarding your invoice.   IF you received labwork today, you will receive an invoice from [facility]. Please contact [facility] at [phone] with questions or concerns regarding your invoice.   Our billing staff will not be able to assist you with questions regarding bills from these companies.  You will be contacted with the lab results as soon as they are available. The fastest way to get your results is to activate your My Chart account. Instructions are located on the last page   of this paperwork. If you have not heard from us regarding the results in 2 weeks, please contact this office.      

## 2017-01-11 IMAGING — DX DG CHEST 2V
2 series · 2 of 2 positions shown · non-contrast
Comparison: 08/21/2014

CLINICAL DATA: Bariatric screening.  Hypertension.

EXAM:
CHEST  2 VIEW

[chest pa]
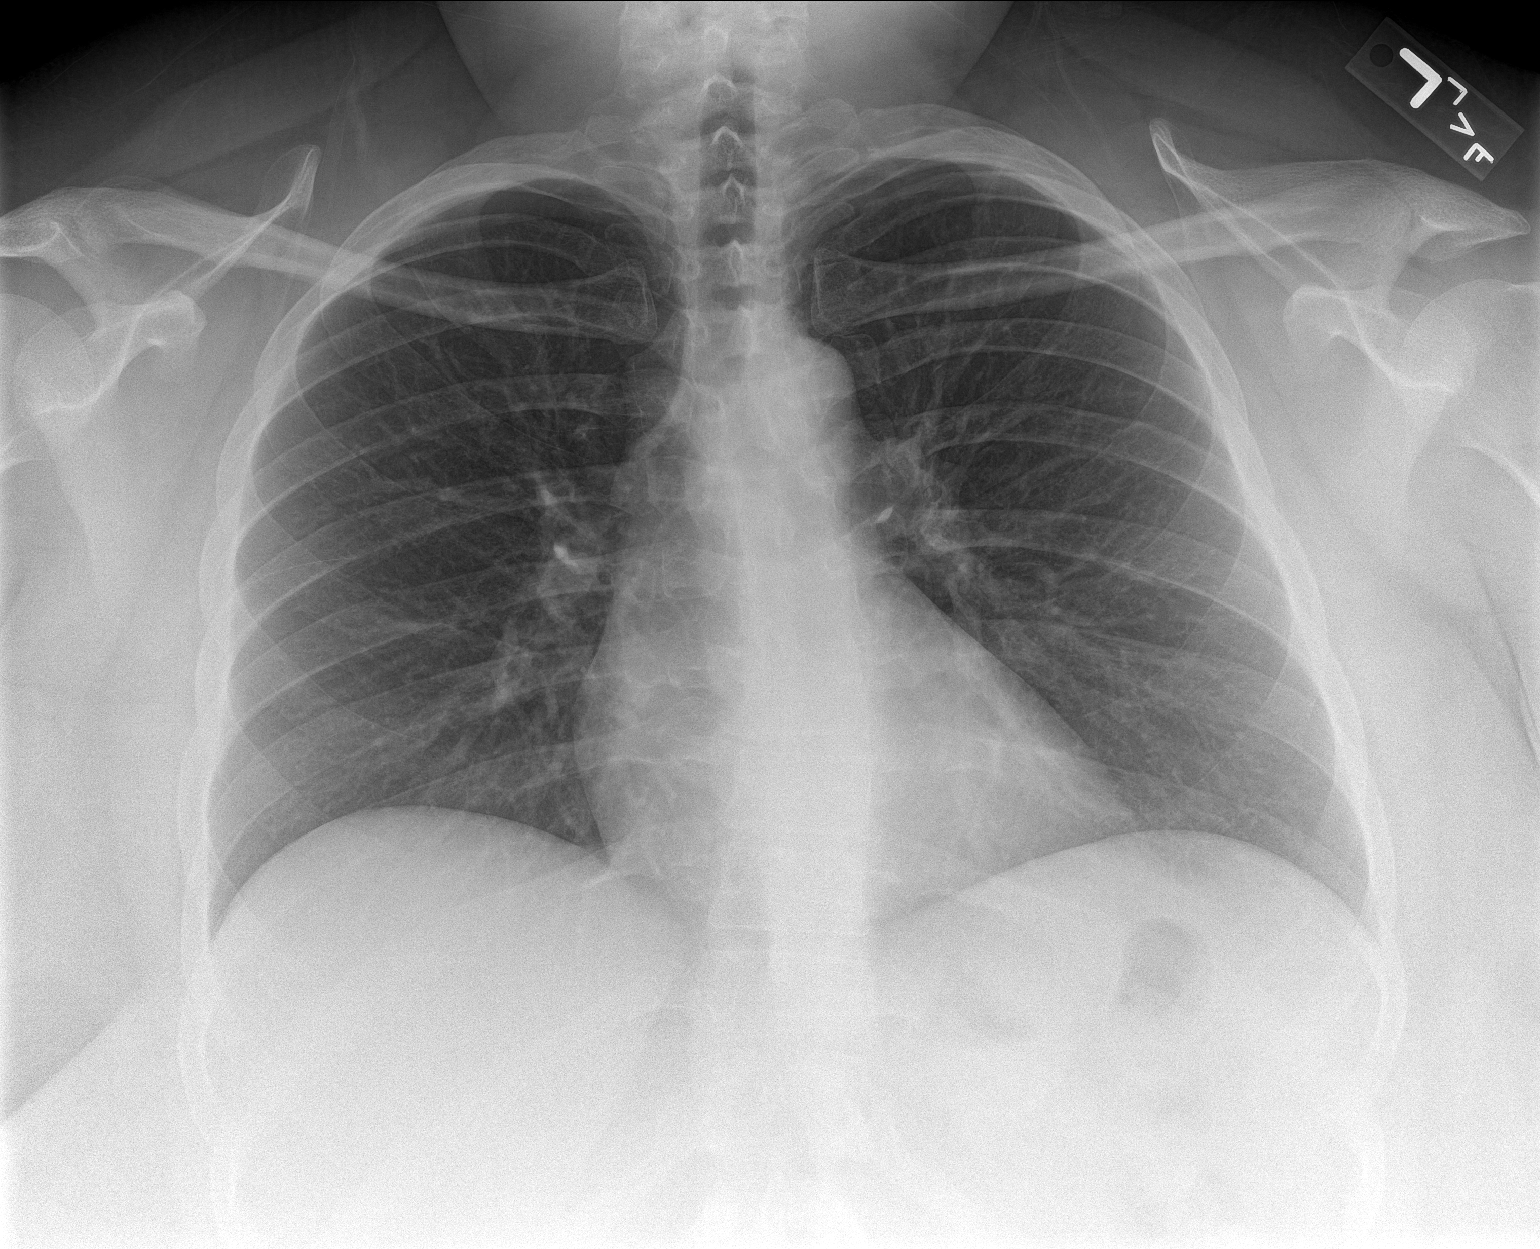

[chest lat]
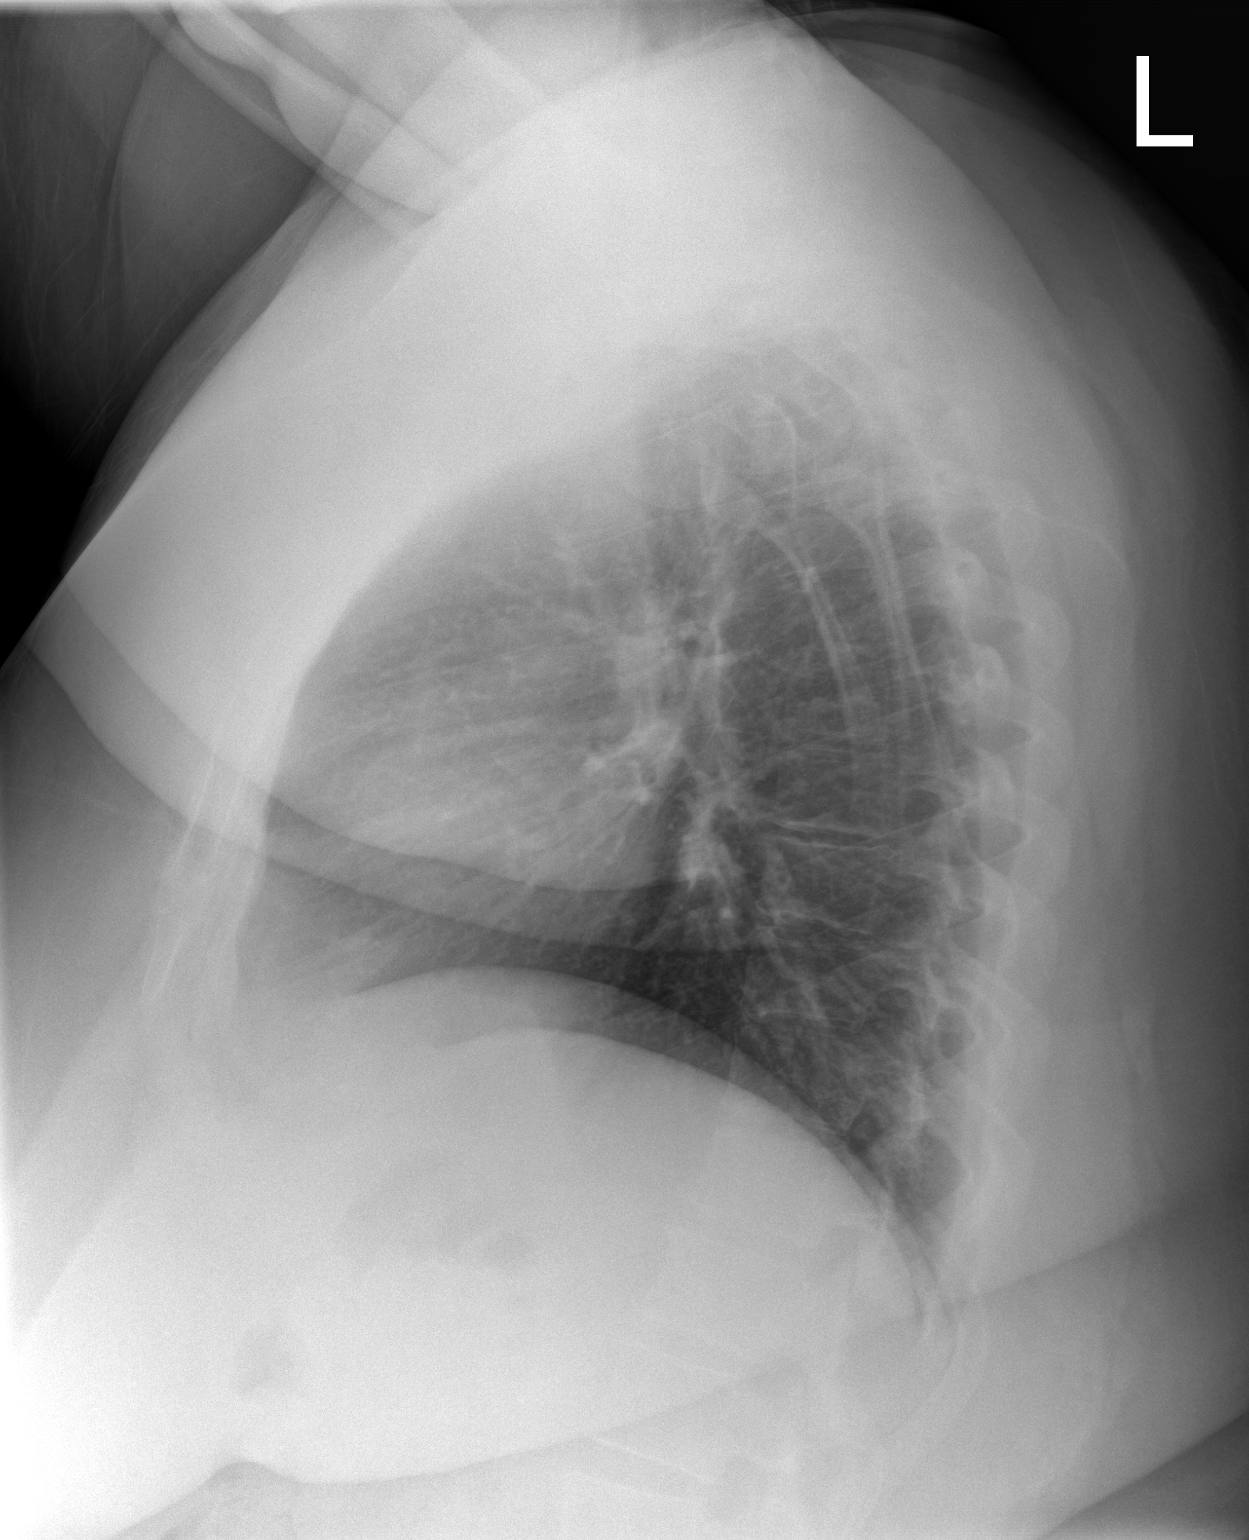

[2 of 2 positions shown; findings below may reference images not displayed]

FINDINGS: The heart size and mediastinal contours are within normal limits.
Both lungs are clear. The visualized skeletal structures are
unremarkable.
IMPRESSION: No active cardiopulmonary disease.

## 2017-01-11 IMAGING — CR DG KNEE COMPLETE 4+V*L*
4 series · 4 of 4 positions shown · non-contrast
Comparison: None.

CLINICAL DATA: Status post fall 2 weeks ago. With persistent left
knee pain ; history of morbid obesity

EXAM:
LEFT KNEE - COMPLETE 4+ VIEW

[w knee ap left]
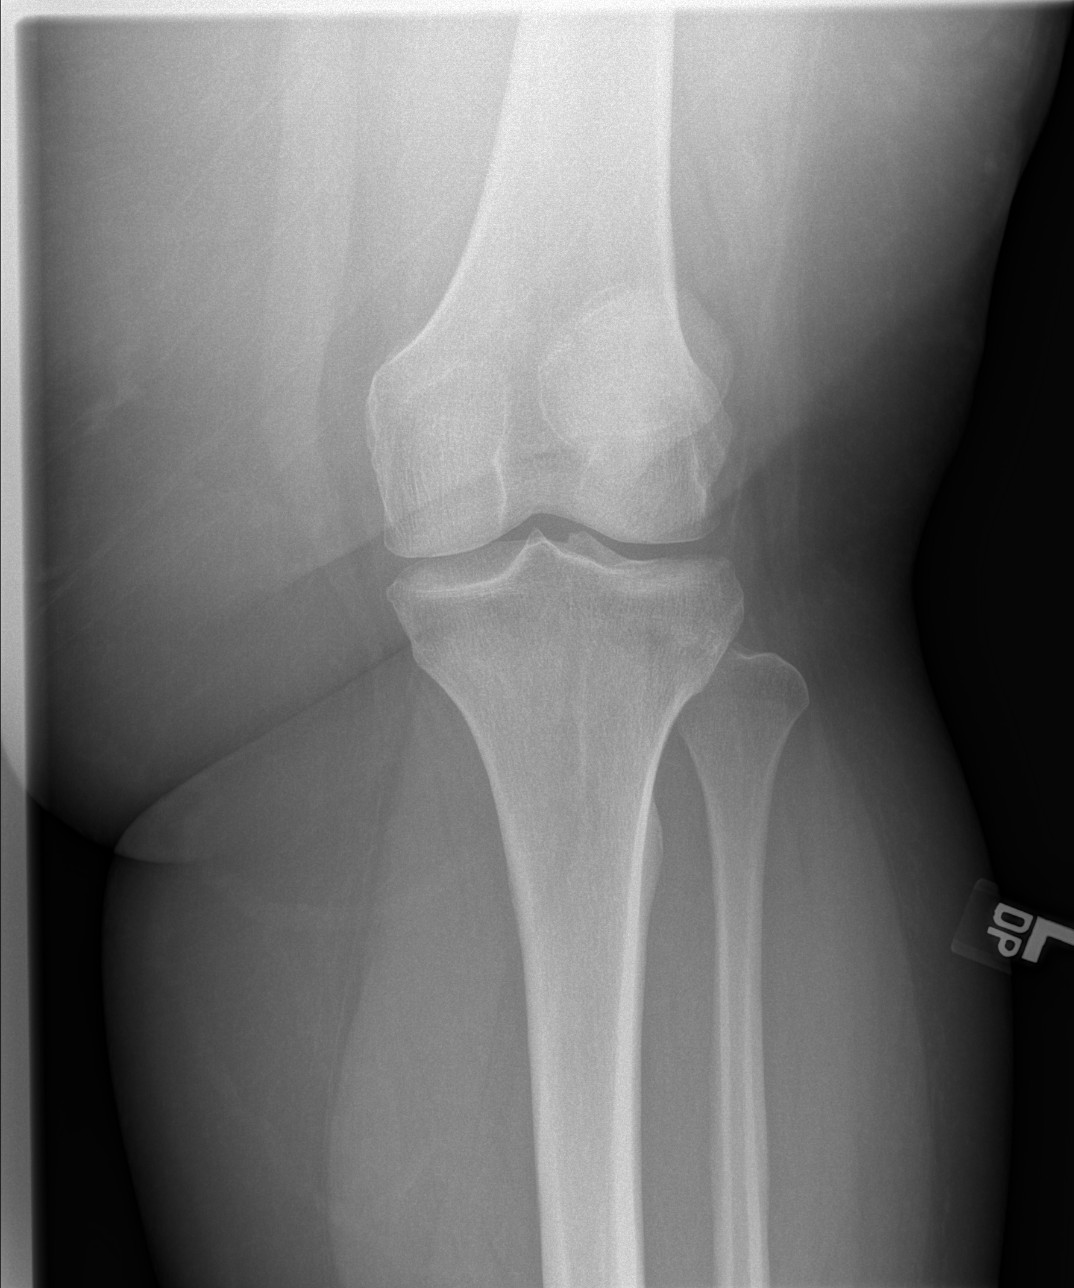

[t knee lat left]
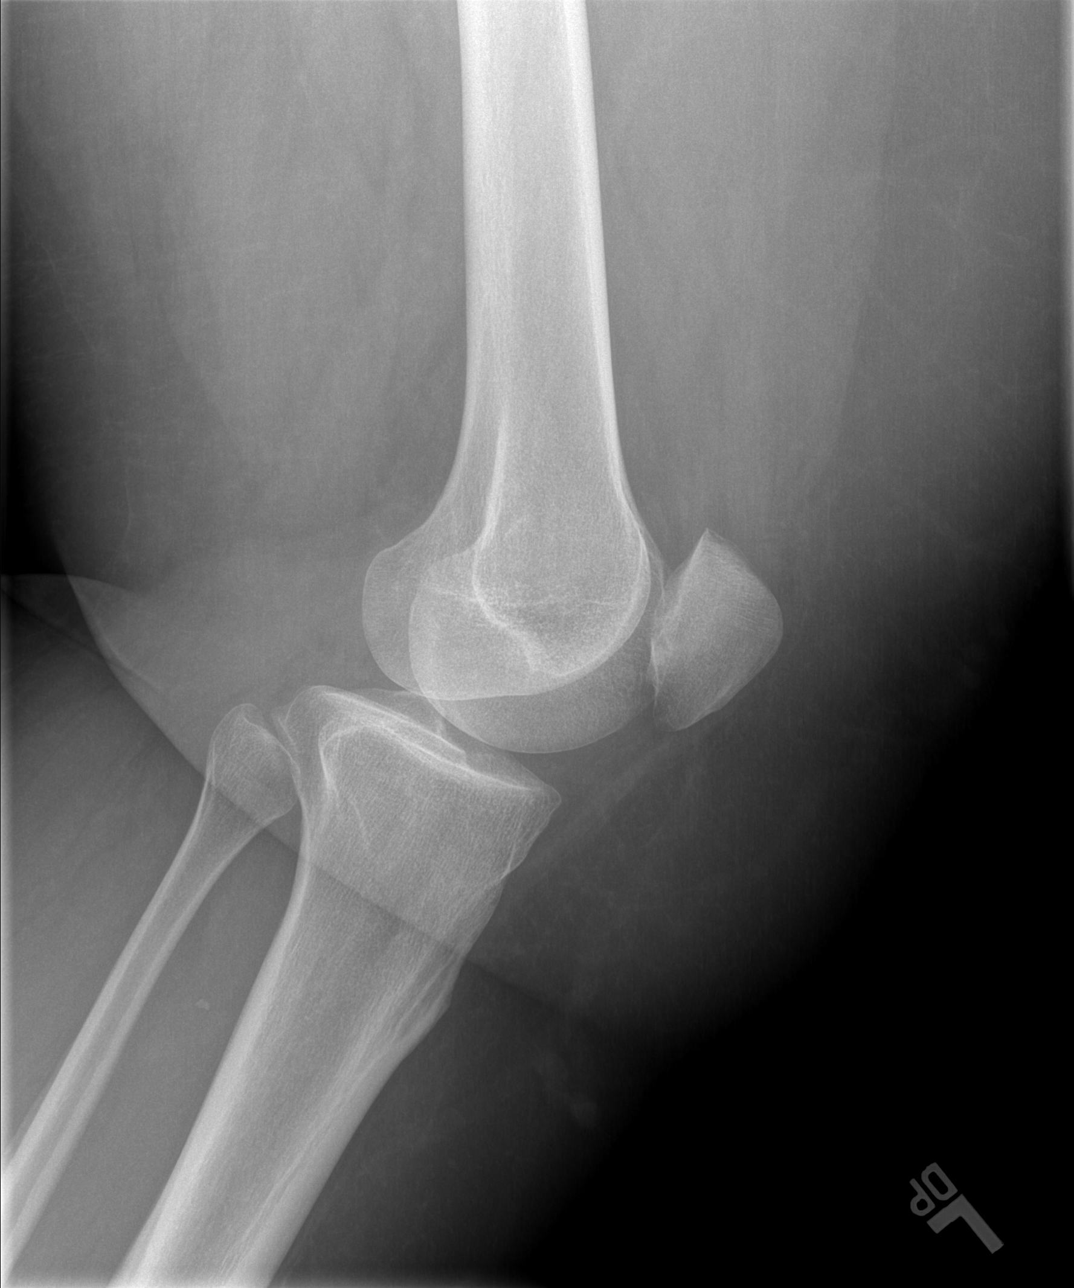

[t knee oblique left (1 of 2)]
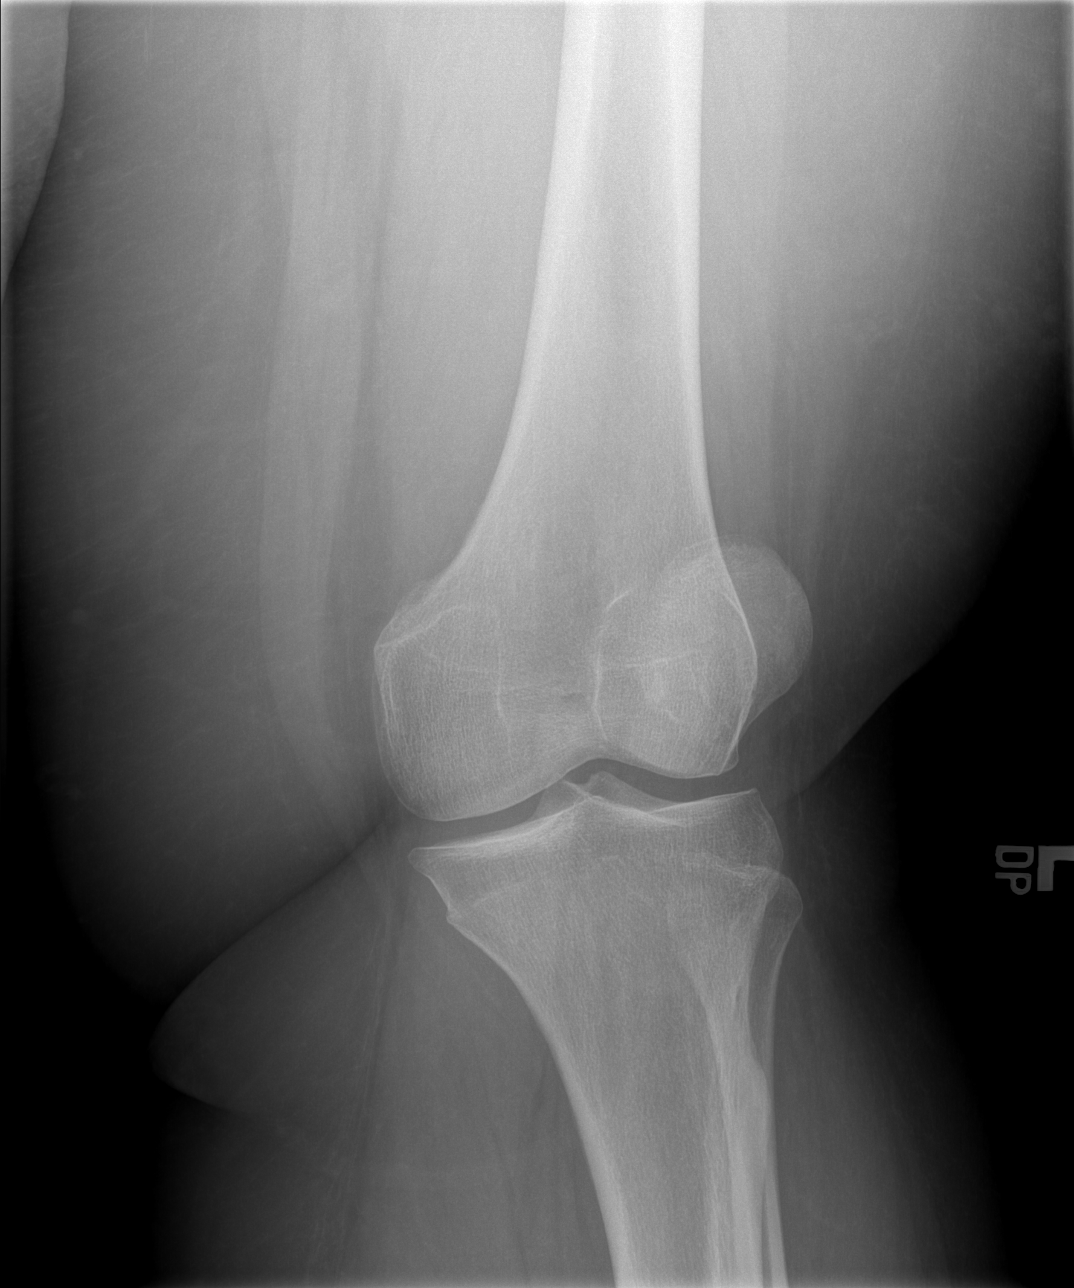

[t knee oblique left (2 of 2)]
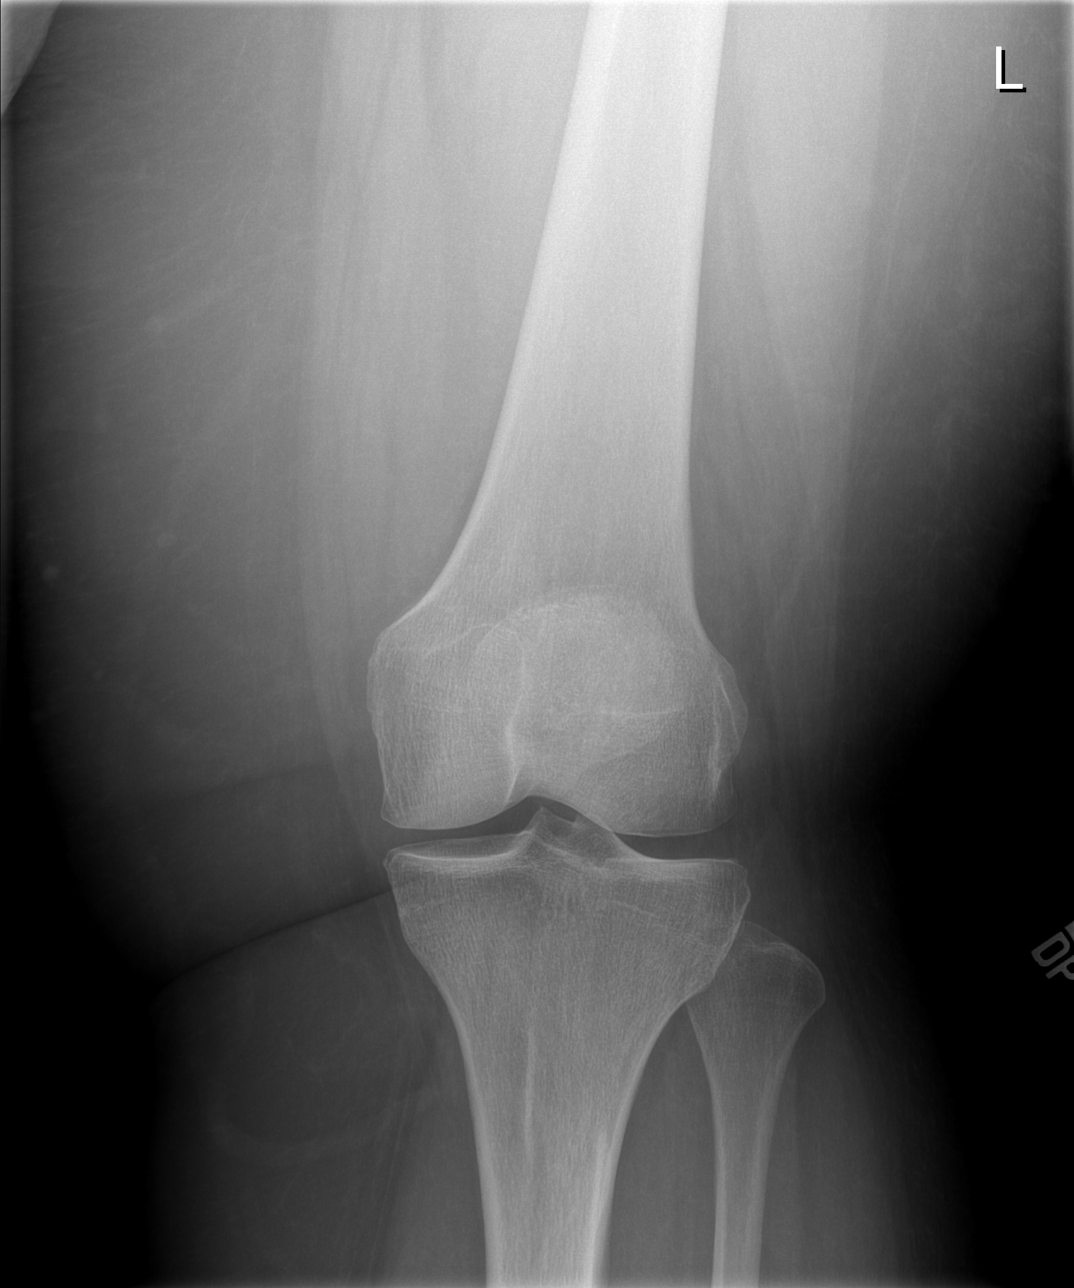

[4 of 4 positions shown; findings below may reference images not displayed]

FINDINGS: The bones of the knee are adequately mineralized. There is no acute
or healing fracture. The joint spaces are preserved. There is
beaking of the tibial spines. There is no joint effusion. The
proximal fibula is intact.
IMPRESSION: There is minimal beaking of the tibial spines consistent with mild
osteoarthritic change. There is no acute bony abnormality.

## 2017-04-08 ENCOUNTER — Ambulatory Visit: Payer: BC Managed Care – PPO | Admitting: Urgent Care

## 2017-10-07 ENCOUNTER — Ambulatory Visit (INDEPENDENT_AMBULATORY_CARE_PROVIDER_SITE_OTHER): Payer: BC Managed Care – PPO | Admitting: Family Medicine

## 2017-10-07 ENCOUNTER — Encounter: Payer: Self-pay | Admitting: Family Medicine

## 2017-10-07 VITALS — BP 112/80 | HR 97 | Temp 98.1°F | Resp 17 | Ht 64.0 in | Wt 300.8 lb

## 2017-10-07 DIAGNOSIS — Z131 Encounter for screening for diabetes mellitus: Secondary | ICD-10-CM

## 2017-10-07 DIAGNOSIS — J069 Acute upper respiratory infection, unspecified: Secondary | ICD-10-CM

## 2017-10-07 DIAGNOSIS — Z833 Family history of diabetes mellitus: Secondary | ICD-10-CM | POA: Diagnosis not present

## 2017-10-07 DIAGNOSIS — J301 Allergic rhinitis due to pollen: Secondary | ICD-10-CM | POA: Diagnosis not present

## 2017-10-07 DIAGNOSIS — Z0000 Encounter for general adult medical examination without abnormal findings: Secondary | ICD-10-CM

## 2017-10-07 DIAGNOSIS — K219 Gastro-esophageal reflux disease without esophagitis: Secondary | ICD-10-CM

## 2017-10-07 DIAGNOSIS — Z130 Encounter for screening for diseases of the blood and blood-forming organs and certain disorders involving the immune mechanism: Secondary | ICD-10-CM | POA: Diagnosis not present

## 2017-10-07 DIAGNOSIS — Z13220 Encounter for screening for lipoid disorders: Secondary | ICD-10-CM | POA: Diagnosis not present

## 2017-10-07 DIAGNOSIS — J4540 Moderate persistent asthma, uncomplicated: Secondary | ICD-10-CM

## 2017-10-07 MED ORDER — ALBUTEROL SULFATE HFA 108 (90 BASE) MCG/ACT IN AERS: 2.0000 | INHALATION_SPRAY | Freq: Four times a day (QID) | RESPIRATORY_TRACT | 5 refills | Status: DC | PRN

## 2017-10-07 MED ORDER — SUCRALFATE 1 G PO TABS: 1.0000 g | ORAL_TABLET | Freq: Three times a day (TID) | ORAL | 0 refills | Status: DC

## 2017-10-07 MED ORDER — BENZONATATE 100 MG PO CAPS: 100.0000 mg | ORAL_CAPSULE | Freq: Three times a day (TID) | ORAL | 0 refills | Status: DC | PRN

## 2017-10-07 MED ORDER — OMEPRAZOLE 20 MG PO CPDR: 20.0000 mg | DELAYED_RELEASE_CAPSULE | Freq: Every day | ORAL | 3 refills | Status: DC

## 2017-10-07 MED ORDER — MONTELUKAST SODIUM 10 MG PO TABS: 10.0000 mg | ORAL_TABLET | Freq: Every day | ORAL | 0 refills | Status: DC

## 2017-10-07 MED ORDER — FAMOTIDINE 20 MG PO TABS: 20.0000 mg | ORAL_TABLET | Freq: Two times a day (BID) | ORAL | Status: DC

## 2017-10-07 NOTE — Patient Instructions (Addendum)
Exercise for 10 minutes minimum a day  Take your omeprazole and sucralfate and pepcid BEFORE your first meal of the day  After that take you sucralfate with the remaining meals and pepcid at bedtime  Cut back on the antihistamine allergy medications because those and pepcid together can make you dizzy  I would really encourage you to get a flu vaccination as you have asthma and this years flu vaccine is not showing any flu type syndromes after getting the shot  Follow up in six weeks     IF you received an x-ray today, you will receive an invoice [facility]. Please contact [facility] at [phone] with questions or concerns regarding your invoice.   IF you received labwork today, you will receive an invoice from [facility]. Please contact [facility] at [phone] with questions or concerns regarding your invoice.   Our billing staff will not be able to assist you with questions regarding bills from these companies.  You will be contacted with the lab results as soon as they are available. The fastest way to get your results is to activate your My Chart account. Instructions are located on the last page of this paperwork. If you have not heard from us regarding the results in 2 weeks, please contact this office.     Heartburn Heartburn is a type of pain or discomfort that can happen in the throat or chest. It is often described as a burning pain. It may also cause a bad taste in the mouth. Heartburn may feel worse when you lie down or bend over, and it is often worse at night. Heartburn may be caused by stomach contents that move back up into the esophagus (reflux). Follow these instructions at home: Take these actions to decrease your discomfort and to help avoid complications. Diet  Follow a diet as recommended by your health care provider. This may involve avoiding foods and drinks such as: ? Coffee and tea (with or without caffeine). ? Drinks that contain  alcohol. ? Energy drinks and sports drinks. ? Carbonated drinks or sodas. ? Chocolate and cocoa. ? Peppermint and mint flavorings. ? Garlic and onions. ? Horseradish. ? Spicy and acidic foods, including peppers, chili powder, curry powder, vinegar, hot sauces, and barbecue sauce. ? Citrus fruit juices and citrus fruits, such as oranges, lemons, and limes. ? Tomato-based foods, such as red sauce, chili, salsa, and pizza with red sauce. ? Fried and fatty foods, such as donuts, french fries, potato chips, and high-fat dressings. ? High-fat meats, such as hot dogs and fatty cuts of red and white meats, such as rib eye steak, sausage, ham, and bacon. ? High-fat dairy items, such as whole milk, butter, and cream cheese.  Eat small, frequent meals instead of large meals.  Avoid drinking large amounts of liquid with your meals.  Avoid eating meals during the 2-3 hours before bedtime.  Avoid lying down right after you eat.  Do not exercise right after you eat. General instructions  Pay attention to any changes in your symptoms.  Take over-the-counter and prescription medicines only as told by your health care provider. Do not take aspirin, ibuprofen, or other NSAIDs unless your health care provider told you to do so.  Do not use any tobacco products, including cigarettes, chewing tobacco, and e-cigarettes. If you need help quitting, ask your health care provider.  Wear loose-fitting clothing. Do not wear anything tight around your waist that causes pressure on your abdomen.  Raise (elevate) the  head of your bed about 6 inches (15 cm).  Try to reduce your stress, such as with yoga or meditation. If you need help reducing stress, ask your health care provider.  If you are overweight, reduce your weight to an amount that is healthy for you. Ask your health care provider for guidance about a safe weight loss goal.  Keep all follow-up visits as told by your health care provider. This is  important. Contact a health care provider if:  You have new symptoms.  You have unexplained weight loss.  You have difficulty swallowing, or it hurts to swallow.  You have wheezing or a persistent cough.  Your symptoms do not improve with treatment.  You have frequent heartburn for more than two weeks. Get help right away if:  You have pain in your arms, neck, jaw, teeth, or back.  You feel sweaty, dizzy, or light-headed.  You have chest pain or shortness of breath.  You vomit and your vomit looks like blood or coffee grounds.  Your stool is bloody or black. This information is not intended to replace advice given to you by your health care provider. Make sure you discuss any questions you have with your health care provider. Document Released: 04/05/2009 Document Revised: 04/24/2016 Document Reviewed: 03/14/2015 [employer] Interactive Patient Education  2017 [employer]

## 2017-10-07 NOTE — Progress Notes (Signed)
Chief Complaint  Patient presents with  . Annual Exam    no pap    Subjective:  Amber Daniels is a 36 y.o. female here for a health maintenance visit.  Patient is established pt  Asthma- moderate persistent  Pt needs refills of her asthma medications She reports that in the past month she wakes up coughing on average 4-5 times She reports that the weather changes have been exacerbating her asthma She sometimes feels like there is weight on her chest  She reports that she gets shortness of breath more due to the weather and in the past week gets daily shortness of breath. 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 She has not had a flu shot and she typically does not get one She reports that she has not had one in 4 years She reports that she been using her albuterol bid this past week (once in the am and pm)   She reports that she had gastric sleeve 2 years ago  She reports that her weight was at a stall and now she has gastric reflux and she feels that she can't eat much and then she has the reflux on top of her difficulty eating She cannot eat acid foods like oranges tomatoes and causes burping and brackish water Wt Readings from Last 3 Encounters:  10/07/17 (!) 300 lb 12.8 oz (136.4 kg)  12/29/16 291 lb (132 kg)  08/22/15 (!) 318 lb 8 oz (144.5 kg)   Moderate depression Pt reports that she is   Patient Active Problem List   Diagnosis Date Noted  . 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 Obesity hypoventilation syndrome (HCC) 12/18/2014  . Snoring 12/18/2014  . Asthma with acute exacerbation 12/18/2014  . Morbid obesity (HCC) 11/17/2014  . HTN (hypertension) 11/17/2014  . Esophageal reflux 11/17/2014  . Bilateral edema of lower extremity 11/17/2014    Past Medical History:  Diagnosis Date  . Allergy   . Anemia    during pregnancy   . Arthritis   . Asthma   . Edema    lower extremities   . GERD (gastroesophageal reflux disease)   .  Headache    occasional migraine   . Heart murmur   . Hypertension   . Morbid obesity (HCC)   . PONV (postoperative nausea and vomiting)   . Postpartum care following cesarean delivery (12/2) 11/01/2013  . Shortness of breath dyspnea    only asthma related   . Sleep apnea    not on CPAP, dx 01/2013 with Outpatietn overnight study at home.     Past Surgical History:  Procedure Laterality Date  . CESAREAN SECTION       Outpatient Medications Prior to Visit  Medication Sig Dispense Refill  . acetaminophen (TYLENOL) 500 MG tablet Take 1,500 mg by mouth every 6 (six) hours as needed. For pain.    . beclomethasone (QVAR) 40 MCG/ACT inhaler Inhale 2 puffs into the lungs daily. 1 Inhaler 1  . cetirizine (ZYRTEC) 10 MG tablet Take 1 tablet (10 mg total) by mouth daily. 30 tablet 3  . cyclobenzaprine (FLEXERIL) 5 MG tablet Take 1 tablet (5 mg total) by mouth 3 (three) times daily as needed for muscle spasms. 30 tablet 0  . fexofenadine (ALLEGRA) 60 MG tablet Take 60 mg by mouth daily. As needed    . FLUOCINOLONE ACETONIDE SCALP 0.01 % OIL Use 1 oz on scalp and work through scalp for 5 min daily  and  then thoroughly rinse with water 118 mL 1  . ibuprofen (ADVIL,MOTRIN) 600 MG tablet Take 1 tablet (600 mg total) by mouth every 6 (six) hours as needed for mild pain. 30 tablet 1  . ketoconazole (NIZORAL) 2 % shampoo Apply 1 application topically 2 (two) times a week. Lather affected eareas, leave on for 5 min then rinse. 120 mL 0  . pseudoephedrine (SUDAFED 12 HOUR) 120 MG 12 hr tablet Take 1 tablet (120 mg total) by mouth 2 (two) times daily. 30 tablet 3  . triamcinolone (NASACORT ALLERGY 24HR) 55 MCG/ACT AERO nasal inhaler Place 2 sprays into the nose daily. 1 Inhaler 12  . albuterol (PROVENTIL HFA;VENTOLIN HFA) 108 (90 Base) MCG/ACT inhaler Inhale 2 puffs into the lungs every 6 (six) hours as needed for wheezing or shortness of breath. 18 g 5  . benzonatate (TESSALON) 100 MG capsule Take 1-2  capsules (100-200 mg total) by mouth 3 (three) times daily as needed. 60 capsule 0  . chlorpheniramine-HYDROcodone (TUSSIONEX PENNKINETIC ER) 10-8 MG/5ML SUER Take 5 mLs by mouth at bedtime as needed for cough. 100 mL 0  . HYDROcodone-acetaminophen (NORCO/VICODIN) 5-325 MG per tablet Take 2 tablets by mouth every 4 (four) hours as needed. 10 tablet 0  . montelukast (SINGULAIR) 10 MG tablet Take 1 tablet (10 mg total) by mouth at bedtime. 30 tablet 0  . NOREL AD 4-10-325 MG TABS Take 1 tablet by mouth 4 (four) times daily. As needed  2  . amoxicillin (AMOXIL) 500 MG capsule Take 1 capsule (500 mg total) by mouth 2 (two) times daily. (Patient not taking: Reported on 10/07/2017) 14 capsule 0  . chlorthalidone (HYGROTON) 25 MG tablet Take 1 tablet (25 mg total) by mouth daily. (Patient not taking: Reported on 12/29/2016) 30 tablet 5  . labetalol (NORMODYNE) 100 MG tablet Take 1 tablet (100 mg total) by mouth 2 (two) times daily. (Patient not taking: Reported on 12/29/2016) 60 tablet 5  . naproxen (NAPROSYN) 500 MG tablet Take 1 tablet (500 mg total) by mouth 2 (two) times daily with a meal. (Patient not taking: Reported on 10/07/2017) 30 tablet 0  . ondansetron (ZOFRAN ODT) 4 MG disintegrating tablet Take 1 tablet (4 mg total) by mouth every 8 (eight) hours as needed for nausea. (Patient not taking: Reported on 10/07/2017) 10 tablet 0   No facility-administered medications prior to visit.     Allergies  Allergen Reactions  . Other     Seasonal and enviromental allergies (dust)     Family History  Problem Relation Age of Onset  . Diabetes Mother   . Hypertension Mother   . Diabetes Father   . Hypertension Father   . Hypertension Brother   . Hypertension Maternal Grandmother   . Hypertension Maternal Grandfather      Health Habits: Dental Exam: up to date Eye Exam: up to date   Social History   Socioeconomic History  . Marital status: Single    Spouse name: Not on file  . Number of  children: Not on file  . Years of education: Not on file  . Highest education level: Not on file  Social Needs  . Financial resource strain: Not on file  . Food insecurity - worry: Not on file  . Food insecurity - inability: Not on file  . Transportation needs - medical: Not on file  . Transportation needs - non-medical: Not on file  Occupational History  . Not on file  Tobacco Use  . Smoking status:  Never Smoker  . Smokeless tobacco: Never Used  Substance and Sexual Activity  . Alcohol use: Yes    Alcohol/week: 4.2 oz    Types: 7 Glasses of wine per week  . Drug use: No  . Sexual activity: Yes    Birth control/protection: IUD  Other Topics Concern  . Not on file  Social History Narrative   Caffeine none.  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     Social History   Substance and Sexual Activity  Alcohol Use Yes  . Alcohol/week: 4.2 oz  . Types: 7 Glasses of wine per week   Social History   Tobacco Use  Smoking Status Never Smoker  Smokeless Tobacco Never Used   Social History   Substance and Sexual Activity  Drug Use No    GYN: Sexual Health Menstrual status: regular menses LMP: No LMP recorded. Patient is not currently having periods (Reason: IUD). Last pap smear: see HM section History of abnormal pap smears:  Sexually active: with female partner Current contraception: IUD  Health Maintenance: See under health Maintenance activity for review of completion dates as well. Immunization History  Administered Date(s) Administered  . Influenza,inj,Quad PF,6+ Mos 10/20/2014  . Influenza-Unspecified 08/02/2013  . Pneumococcal Polysaccharide-23 11/04/2013  . Tdap 08/19/2013      Depression Screen-PHQ2/9 [facility] screen [facility] 2/[facility] 10/07/2017 12/29/2016 08/22/2015 07/10/2015 04/09/2015  Decreased Interest 1 0 0 0 0  Down, Depressed, Hopeless 2 0 0 0 0  PHQ - 2 Score 3 0 0 0 0  Altered sleeping 1 - - - -  Tired, decreased energy 2 - - - -  Change in appetite  3 - - - -  Feeling bad or failure about yourself  1 - - - -  Trouble concentrating 2 - - - -  Moving slowly or fidgety/restless 1 - - - -  Suicidal thoughts 0 - - - -  PHQ-9 Score 13 - - - -  Difficult doing work/chores Somewhat difficult - - - -       Depression Severity and Treatment Recommendations:  0-4= None  5-9= Mild / Treatment: Support, educate to call if worse; return in one month  10-14= Moderate / Treatment: Support, watchful waiting; Antidepressant or Psycotherapy  15-19= Moderately severe / Treatment: Antidepressant OR Psychotherapy  >= 20 = Major depression, severe / Antidepressant AND Psychotherapy    Review of Systems   Review of Systems  Constitutional: Negative for chills, fever and weight loss.  HENT: Negative for hearing loss and nosebleeds.   Eyes: Negative for discharge and redness.  Respiratory: Negative for cough, shortness of breath and wheezing.   Cardiovascular: Negative for chest pain and palpitations.  Gastrointestinal: Negative for abdominal pain, nausea and vomiting.  Skin: Negative for itching and rash.  Psychiatric/Behavioral: Negative for depression. The patient is not nervous/anxious.     See HPI for ROS as well.    Objective:   Vitals:   10/07/17 1012  BP: 112/80  Pulse: 97  Resp: 17  Temp: 98.1 F (36.7 C)  TempSrc: Oral  SpO2: 96%  Weight: (!) 300 lb 12.8 oz (136.4 kg)  Height: 5\' 4"  (1.626 m)    Body mass index is 51.63 kg/m.  Physical Exam  Constitutional: She is oriented to person, place, and time. She appears well-developed and well-nourished.  Eyes: Conjunctivae and EOM are normal. Pupils are equal, round, and reactive to light.  Neck: Normal range of motion. No thyromegaly present.  Cardiovascular: Normal rate,  regular rhythm and normal heart sounds.  Pulmonary/Chest: Effort normal and breath sounds normal. No respiratory distress. She has no wheezes.  Abdominal: Soft. Bowel sounds are normal. She exhibits no  distension and no mass. There is no tenderness. There is no rebound and no guarding.  Neurological: She is alert and oriented to person, place, and time. She has normal reflexes.  Skin: Skin is warm. No erythema.  Psychiatric: She has a normal mood and affect. Her behavior is normal. Judgment and thought content normal.       Assessment/Plan:   Patient was seen for a health maintenance exam.  Counseled the patient on health maintenance issues. Reviewed her health mainteance schedule and ordered appropriate tests (see orders.) Counseled on regular exercise and weight management. Recommend regular eye exams and dental cleaning.   The following issues were addressed today for health maintenance:   Tiesha was seen today for annual exam.  Diagnoses and all orders for this visit:  Health maintenance examination- advised age appropriate screenings -     Lipid panel  Morbid obesity (HCC) - discussed exercise for at least 10 minutes daily -     Lipid panel -     Hemoglobin A1c -     TSH  Seasonal allergic rhinitis due to pollen- added singulair in patient with allergies and asthma -     montelukast (SINGULAIR) 10 MG tablet; Take 1 tablet (10 mg total) at bedtime by mouth.  Acute URI- supportive care discussed  Moderate persistent asthma without complication  Screening for diabetes mellitus- pt with weight loss from bariatric surgery but still with morbid obesity Will screen -     Hemoglobin A1c  Family history of diabetes mellitus (DM) -     Hemoglobin A1c  Screening, anemia, deficiency, iron- pt s/p bariatric surgery -     CBC  Screening, lipid -     Lipid panel  Gastroesophageal reflux disease without esophagitis - added omeprazole with sucralfate One month follow up  Other orders -     albuterol (PROVENTIL HFA;VENTOLIN HFA) 108 (90 Base) MCG/ACT inhaler; Inhale 2 puffs every 6 (six) hours as needed into the lungs for wheezing or shortness of breath. -     benzonatate  (TESSALON) 100 MG capsule; Take 1-2 capsules (100-200 mg total) 3 (three) times daily as needed by mouth. -     omeprazole (PRILOSEC) 20 MG capsule; Take 1 capsule (20 mg total) daily by mouth. -     sucralfate (CARAFATE) 1 g tablet; Take 1 tablet (1 g total) 4 (four) times daily -  with meals and at bedtime by mouth. -     famotidine (PEPCID) 20 MG tablet; Take 1 tablet (20 mg total) 2 (two) times daily by mouth.    Return in about 6 weeks (around 11/18/2017) for follow up with reflux.    Body mass index is 51.63 kg/m.:  Discussed the patient's BMI with patient. The BMI body mass index is 51.63 kg/m.     Future Appointments  Date Time Provider Department Center  11/18/2017  4:20 PM Doristine Bosworth, MD PCP-PCP [facility]    Patient Instructions   Exercise for 10 minutes minimum a day  Take your omeprazole and sucralfate and pepcid BEFORE your first meal of the day  After that take you sucralfate with the remaining meals and pepcid at bedtime  Cut back on the antihistamine allergy medications because those and pepcid together can make you dizzy  I would really encourage you to  get a flu vaccination as you have asthma and this years flu vaccine is not showing any flu type syndromes after getting the shot  Follow up in six weeks     IF you received an x-ray today, you will receive an invoice from [facility]. Please contact [facility] at [phone] with questions or concerns regarding your invoice.   IF you received labwork today, you will receive an invoice from [facility]. Please contact [facility] at [phone] with questions or concerns regarding your invoice.   Our billing staff will not be able to assist you with questions regarding bills from these companies.  You will be contacted with the lab results as soon as they are available. The fastest way to get your results is to activate your My Chart account. Instructions are located on the last page of this  paperwork. If you have not heard from [place] regarding the results in 2 weeks, please contact this office.     Heartburn Heartburn is a type of pain or discomfort that can happen in the throat or chest. It is often described as a burning pain. It may also cause a bad taste in the mouth. Heartburn may feel worse when you lie down or bend over, and it is often worse at night. Heartburn may be caused by stomach contents that move back up into the esophagus (reflux). Follow these instructions at home: Take these actions to decrease your discomfort and to help avoid complications. Diet  Follow a diet as recommended by your health care provider. This may involve avoiding foods and drinks such as: ? Coffee and tea (with or without caffeine). ? Drinks that contain alcohol. ? Energy drinks and sports drinks. ? Carbonated drinks or sodas. ? Chocolate and cocoa. ? Peppermint and mint flavorings. ? Garlic and onions. ? Horseradish. ? Spicy and acidic foods, including peppers, chili powder, curry powder, vinegar, hot sauces, and barbecue sauce. ? Citrus fruit juices and citrus fruits, such as oranges, lemons, and limes. ? Tomato-based foods, such as red sauce, chili, salsa, and pizza with red sauce. ? Fried and fatty foods, such as donuts, french fries, potato chips, and high-fat dressings. ? High-fat meats, such as hot dogs and fatty cuts of red and white meats, such as rib eye steak, sausage, ham, and bacon. ? High-fat dairy items, such as whole milk, butter, and cream cheese.  Eat small, frequent meals instead of large meals.  Avoid drinking large amounts of liquid with your meals.  Avoid eating meals during the 2-3 hours before bedtime.  Avoid lying down right after you eat.  Do not exercise right after you eat. General instructions  Pay attention to any changes in your symptoms.  Take over-the-counter and prescription medicines only as told by your health care provider. Do not take  aspirin, ibuprofen, or other NSAIDs unless your health care provider told you to do so.  Do not use any tobacco products, including cigarettes, chewing tobacco, and e-cigarettes. If you need help quitting, ask your health care provider.  Wear loose-fitting clothing. Do not wear anything tight around your waist that causes pressure on your abdomen.  Raise (elevate) the head of your bed about 6 inches (15 cm).  Try to reduce your stress, such as with yoga or meditation. If you need help reducing stress, ask your health care provider.  If you are overweight, reduce your weight to an amount that is healthy for you. Ask your health care provider for guidance about a safe weight  loss goal.  Keep all follow-up visits as told by your health care provider. This is important. Contact a health care provider if:  You have new symptoms.  You have unexplained weight loss.  You have difficulty swallowing, or it hurts to swallow.  You have wheezing or a persistent cough.  Your symptoms do not improve with treatment.  You have frequent heartburn for more than two weeks. Get help right away if:  You have pain in your arms, neck, jaw, teeth, or back.  You feel sweaty, dizzy, or light-headed.  You have chest pain or shortness of breath.  You vomit and your vomit looks like blood or coffee grounds.  Your stool is bloody or black. This information is not intended to replace advice given to you by your health care provider. Make sure you discuss any questions you have with your health care provider. Document Released: 04/05/2009 Document Revised: 04/24/2016 Document Reviewed: 03/14/2015 Elsevier Interactive Patient Education  2017 [employer]

## 2017-10-08 LAB — HEMOGLOBIN A1C
Est. average glucose Bld gHb Est-mCnc: 108 mg/dL
HEMOGLOBIN A1C: 5.4 % (ref 4.8–5.6)

## 2017-10-08 LAB — CBC
HEMATOCRIT: 39.1 % (ref 34.0–46.6)
Hemoglobin: 12.4 g/dL (ref 11.1–15.9)
MCH: 27 pg (ref 26.6–33.0)
MCHC: 31.7 g/dL (ref 31.5–35.7)
MCV: 85 fL (ref 79–97)
Platelets: 227 10*3/uL (ref 150–379)
RBC: 4.6 x10E6/uL (ref 3.77–5.28)
RDW: 15 % (ref 12.3–15.4)
WBC: 6.4 10*3/uL (ref 3.4–10.8)

## 2017-10-08 LAB — LIPID PANEL
Chol/HDL Ratio: 2.3 ratio (ref 0.0–4.4)
Cholesterol, Total: 141 mg/dL (ref 100–199)
HDL: 61 mg/dL (ref >39)
LDL CALC: 68 mg/dL (ref 0–99)
Triglycerides: 58 mg/dL (ref 0–149)
VLDL CHOLESTEROL CAL: 12 mg/dL (ref 5–40)

## 2017-10-08 LAB — TSH: TSH: 1.87 u[IU]/mL (ref 0.450–4.500)

## 2017-11-18 ENCOUNTER — Ambulatory Visit: Payer: BC Managed Care – PPO | Admitting: Family Medicine

## 2017-11-18 NOTE — Progress Notes (Deleted)
No chief complaint on file.   HPI  4 review of systems  Past Medical History:  Diagnosis Date  . Allergy   . Anemia    during pregnancy   . Arthritis   . Asthma   . Edema    lower extremities   . GERD (gastroesophageal reflux disease)   . Headache    occasional migraine   . Heart murmur   . Hypertension   . Morbid obesity (HCC)   . PONV (postoperative nausea and vomiting)   . Postpartum care following cesarean delivery (12/2) 11/01/2013  . Shortness of breath dyspnea    only asthma related   . Sleep apnea    not on CPAP, dx 01/2013 with Outpatietn overnight study at home.     Current Outpatient Medications  Medication Sig Dispense Refill  . acetaminophen (TYLENOL) 500 MG tablet Take 1,500 mg by mouth every 6 (six) hours as needed. For pain.    Marland Kitchen. albuterol (PROVENTIL HFA;VENTOLIN HFA) 108 (90 Base) MCG/ACT inhaler Inhale 2 puffs every 6 (six) hours as needed into the lungs for wheezing or shortness of breath. 18 g 5  . beclomethasone (QVAR) 40 MCG/ACT inhaler Inhale 2 puffs into the lungs daily. 1 Inhaler 1  . benzonatate (TESSALON) 100 MG capsule Take 1-2 capsules (100-200 mg total) 3 (three) times daily as needed by mouth. 60 capsule 0  . cetirizine (ZYRTEC) 10 MG tablet Take 1 tablet (10 mg total) by mouth daily. 30 tablet 3  . cyclobenzaprine (FLEXERIL) 5 MG tablet Take 1 tablet (5 mg total) by mouth 3 (three) times daily as needed for muscle spasms. 30 tablet 0  . famotidine (PEPCID) 20 MG tablet Take 1 tablet (20 mg total) 2 (two) times daily by mouth.    . fexofenadine (ALLEGRA) 60 MG tablet Take 60 mg by mouth daily. As needed    . FLUOCINOLONE ACETONIDE SCALP 0.01 % OIL Use 1 oz on scalp and work through scalp for 5 min daily  and then thoroughly rinse with water 118 mL 1  . ibuprofen (ADVIL,MOTRIN) 600 MG tablet Take 1 tablet (600 mg total) by mouth every 6 (six) hours as needed for mild pain. 30 tablet 1  . ketoconazole (NIZORAL) 2 % shampoo Apply 1 application  topically 2 (two) times a week. Lather affected eareas, leave on for 5 min then rinse. 120 mL 0  . montelukast (SINGULAIR) 10 MG tablet Take 1 tablet (10 mg total) at bedtime by mouth. 30 tablet 0  . omeprazole (PRILOSEC) 20 MG capsule Take 1 capsule (20 mg total) daily by mouth. 30 capsule 3  . pseudoephedrine (SUDAFED 12 HOUR) 120 MG 12 hr tablet Take 1 tablet (120 mg total) by mouth 2 (two) times daily. 30 tablet 3  . sucralfate (CARAFATE) 1 g tablet Take 1 tablet (1 g total) 4 (four) times daily -  with meals and at bedtime by mouth. 240 tablet 0  . triamcinolone (NASACORT ALLERGY 24HR) 55 MCG/ACT AERO nasal inhaler Place 2 sprays into the nose daily. 1 Inhaler 12   No current facility-administered medications for this visit.     Allergies:  Allergies  Allergen Reactions  . Other     Seasonal and enviromental allergies (dust)    Past Surgical History:  Procedure Laterality Date  . BREATH TEK H PYLORI N/A 02/22/2015   Procedure: BREATH TEK H PYLORI;  Surgeon: Luretha MurphyMatthew Martin, MD;  [facility];  Service: General;  Laterality: N/A;  . CESAREAN SECTION    .  CESAREAN SECTION N/A 11/01/2013   Procedure: REPEAT CESAREAN SECTION;  Surgeon: Serita KyleSheronette A Cousins, MD;  Location: [facility];  Service: Obstetrics;  Laterality: N/A;  EDD: 11/04/13  . LAPAROSCOPIC GASTRIC SLEEVE RESECTION N/A 06/25/2015   Procedure: LAPAROSCOPIC GASTRIC SLEEVE RESECTION;  Surgeon: Luretha MurphyMatthew Martin, MD;  Location: [facility];  Service: General;  Laterality: N/A;    Social History   Socioeconomic History  . Marital status: Single    Spouse name: Not on file  . Number of children: Not on file  . Years of education: Not on file  . Highest education level: Not on file  Social Needs  . Financial resource strain: Not on file  . Food insecurity - worry: Not on file  . Food insecurity - inability: Not on file  . Transportation needs - medical: Not on file  . Transportation needs - non-medical: Not on file    Occupational History  . Not on file  Tobacco Use  . Smoking status: Never Smoker  . Smokeless tobacco: Never Used  Substance and Sexual Activity  . Alcohol use: Yes    Alcohol/week: 4.2 oz    Types: 7 Glasses of wine per week  . Drug use: No  . Sexual activity: Yes    Birth control/protection: IUD  Other Topics Concern  . Not on file  Social History Narrative   Caffeine none.  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      Family History  Problem Relation Age of Onset  . Diabetes Mother   . Hypertension Mother   . Diabetes Father   . Hypertension Father   . Hypertension Brother   . Hypertension Maternal Grandmother   . Hypertension Maternal Grandfather      ROS Review of Systems See HPI Constitution: No fevers or chills No malaise No diaphoresis Skin: No rash or itching Eyes: no blurry vision, no double vision GU: no dysuria or hematuria Neuro: no dizziness or headaches * all others reviewed and negative   Objective: There were no vitals filed for this visit.  Physical Exam  Assessment and Plan There are no diagnoses linked to this encounter.   Zyaire Dumas P [employer]addy

## 2017-12-04 ENCOUNTER — Ambulatory Visit: Payer: BC Managed Care – PPO | Admitting: Family Medicine

## 2017-12-04 NOTE — Progress Notes (Deleted)
No chief complaint on file.   HPI  4 review of systems  Past Medical History:  Diagnosis Date  . Allergy   . Anemia    during pregnancy   . Arthritis   . Asthma   . Edema    lower extremities   . GERD (gastroesophageal reflux disease)   . Headache    occasional migraine   . Heart murmur   . Hypertension   . Morbid obesity (HCC)   . PONV (postoperative nausea and vomiting)   . Postpartum care following cesarean delivery (12/2) 11/01/2013  . Shortness of breath dyspnea    only asthma related   . Sleep apnea    not on CPAP, dx 01/2013 with Outpatietn overnight study at home.     Current Outpatient Medications  Medication Sig Dispense Refill  . acetaminophen (TYLENOL) 500 MG tablet Take 1,500 mg by mouth every 6 (six) hours as needed. For pain.    Marland Kitchen. albuterol (PROVENTIL HFA;VENTOLIN HFA) 108 (90 Base) MCG/ACT inhaler Inhale 2 puffs every 6 (six) hours as needed into the lungs for wheezing or shortness of breath. 18 g 5  . beclomethasone (QVAR) 40 MCG/ACT inhaler Inhale 2 puffs into the lungs daily. 1 Inhaler 1  . benzonatate (TESSALON) 100 MG capsule Take 1-2 capsules (100-200 mg total) 3 (three) times daily as needed by mouth. 60 capsule 0  . cetirizine (ZYRTEC) 10 MG tablet Take 1 tablet (10 mg total) by mouth daily. 30 tablet 3  . cyclobenzaprine (FLEXERIL) 5 MG tablet Take 1 tablet (5 mg total) by mouth 3 (three) times daily as needed for muscle spasms. 30 tablet 0  . famotidine (PEPCID) 20 MG tablet Take 1 tablet (20 mg total) 2 (two) times daily by mouth.    . fexofenadine (ALLEGRA) 60 MG tablet Take 60 mg by mouth daily. As needed    . FLUOCINOLONE ACETONIDE SCALP 0.01 % OIL Use 1 oz on scalp and work through scalp for 5 min daily  and then thoroughly rinse with water 118 mL 1  . ibuprofen (ADVIL,MOTRIN) 600 MG tablet Take 1 tablet (600 mg total) by mouth every 6 (six) hours as needed for mild pain. 30 tablet 1  . ketoconazole (NIZORAL) 2 % shampoo Apply 1 application  topically 2 (two) times a week. Lather affected eareas, leave on for 5 min then rinse. 120 mL 0  . montelukast (SINGULAIR) 10 MG tablet Take 1 tablet (10 mg total) at bedtime by mouth. 30 tablet 0  . omeprazole (PRILOSEC) 20 MG capsule Take 1 capsule (20 mg total) daily by mouth. 30 capsule 3  . pseudoephedrine (SUDAFED 12 HOUR) 120 MG 12 hr tablet Take 1 tablet (120 mg total) by mouth 2 (two) times daily. 30 tablet 3  . sucralfate (CARAFATE) 1 g tablet Take 1 tablet (1 g total) 4 (four) times daily -  with meals and at bedtime by mouth. 240 tablet 0  . triamcinolone (NASACORT ALLERGY 24HR) 55 MCG/ACT AERO nasal inhaler Place 2 sprays into the nose daily. 1 Inhaler 12   No current facility-administered medications for this visit.     Allergies:  Allergies  Allergen Reactions  . Other     Seasonal and enviromental allergies (dust)    Past Surgical History:  Procedure Laterality Date  . BREATH TEK H PYLORI N/A 02/22/2015   Procedure: BREATH TEK H PYLORI;  Surgeon: Luretha MurphyMatthew Martin, MD;  [facility];  Service: General;  Laterality: N/A;  . CESAREAN SECTION    .  CESAREAN SECTION N/A 11/01/2013   Procedure: REPEAT CESAREAN SECTION;  Surgeon: Serita KyleSheronette A Cousins, MD;  Location: [facility];  Service: Obstetrics;  Laterality: N/A;  EDD: 11/04/13  . LAPAROSCOPIC GASTRIC SLEEVE RESECTION N/A 06/25/2015   Procedure: LAPAROSCOPIC GASTRIC SLEEVE RESECTION;  Surgeon: Luretha MurphyMatthew Martin, MD;  Location: [facility];  Service: General;  Laterality: N/A;    Social History   Socioeconomic History  . Marital status: Single    Spouse name: Not on file  . Number of children: Not on file  . Years of education: Not on file  . Highest education level: Not on file  Social Needs  . Financial resource strain: Not on file  . Food insecurity - worry: Not on file  . Food insecurity - inability: Not on file  . Transportation needs - medical: Not on file  . Transportation needs - non-medical: Not on file    Occupational History  . Not on file  Tobacco Use  . Smoking status: Never Smoker  . Smokeless tobacco: Never Used  Substance and Sexual Activity  . Alcohol use: Yes    Alcohol/week: 4.2 oz    Types: 7 Glasses of wine per week  . Drug use: No  . Sexual activity: Yes    Birth control/protection: IUD  Other Topics Concern  . Not on file  Social History Narrative   Caffeine none.  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      Family History  Problem Relation Age of Onset  . Diabetes Mother   . Hypertension Mother   . Diabetes Father   . Hypertension Father   . Hypertension Brother   . Hypertension Maternal Grandmother   . Hypertension Maternal Grandfather      ROS Review of Systems See HPI Constitution: No fevers or chills No malaise No diaphoresis Skin: No rash or itching Eyes: no blurry vision, no double vision GU: no dysuria or hematuria Neuro: no dizziness or headaches * all others reviewed and negative   Objective: There were no vitals filed for this visit.  Physical Exam  Assessment and Plan There are no diagnoses linked to this encounter.   Dymir Neeson P [employer]

## 2017-12-16 ENCOUNTER — Other Ambulatory Visit: Payer: Self-pay | Admitting: Family Medicine

## 2018-01-26 ENCOUNTER — Encounter (HOSPITAL_COMMUNITY): Payer: Self-pay

## 2018-02-02 ENCOUNTER — Ambulatory Visit: Payer: Self-pay

## 2018-02-02 NOTE — Telephone Encounter (Signed)
Patient called in with c/o "sore throat." Her voice is at a faint whisper to no voice, so her daughter is repeating her answers to the questions. 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 I have a runny nose, cough, hoarse." I asked does she have pus on tonsils, she said "2 weeks ago there were white patches, but not now." I asked about any other symptoms, she said "headache, lightheaded." According to protocol, see PCP within 3 days, appointment made for tomorrow at 0940 with Wallis BambergMario Mani, PA, care advice given, she verbalized understanding.  Reason for Disposition . [1] Sore throat is the only symptom AND [2] present > 48 hours  Answer Assessment - Initial Assessment Questions 1. ONSET: "When did the throat start hurting?" (Hours or days ago)      Sunday 2. SEVERITY: "How bad is the sore throat?" (Scale 1-10; mild, moderate or severe)   - MILD (1-3):  doesn't interfere with eating or normal activities   - MODERATE (4-7): interferes with eating some solids and normal activities   - SEVERE (8-10):  excruciating pain, interferes with most normal activities   - SEVERE DYSPHAGIA: can't swallow liquids, drooling     7 3. STREP EXPOSURE: "Has there been any exposure to strep within the past week?" If so, ask: "What type of contact occurred?"      No 4.  VIRAL SYMPTOMS: "Are there any symptoms of a cold, such as a runny nose, cough, hoarse voice or red eyes?"      Cough, hoarse voice, runny nose 5. FEVER: "Do you have a fever?" If so, ask: "What is your temperature, how was it measured, and when did it start?"     Mild 99.6 highest-tylenol and motrin 6. PUS ON THE TONSILS: "Is there pus on the tonsils in the back of your throat?"     2 weeks ago 7. OTHER SYMPTOMS: "Do you have any other symptoms?" (e.g., difficulty  breathing, headache, rash)     Headache, light headed 8. PREGNANCY: "Is there any chance you are pregnant?" "When was your last menstrual period?"     No-no periods  Protocols used: SORE THROAT-A-AH

## 2018-02-03 ENCOUNTER — Encounter: Payer: Self-pay | Admitting: Urgent Care

## 2018-02-03 ENCOUNTER — Ambulatory Visit: Payer: BC Managed Care – PPO | Admitting: Urgent Care

## 2018-02-03 VITALS — BP 128/84 | HR 67 | Temp 97.6°F | Resp 18 | Ht 64.0 in | Wt 307.0 lb

## 2018-02-03 DIAGNOSIS — J4530 Mild persistent asthma, uncomplicated: Secondary | ICD-10-CM

## 2018-02-03 DIAGNOSIS — J069 Acute upper respiratory infection, unspecified: Secondary | ICD-10-CM

## 2018-02-03 DIAGNOSIS — R059 Cough, unspecified: Secondary | ICD-10-CM

## 2018-02-03 DIAGNOSIS — J3489 Other specified disorders of nose and nasal sinuses: Secondary | ICD-10-CM

## 2018-02-03 DIAGNOSIS — R0602 Shortness of breath: Secondary | ICD-10-CM | POA: Diagnosis not present

## 2018-02-03 DIAGNOSIS — J029 Acute pharyngitis, unspecified: Secondary | ICD-10-CM

## 2018-02-03 DIAGNOSIS — R0789 Other chest pain: Secondary | ICD-10-CM | POA: Diagnosis not present

## 2018-02-03 DIAGNOSIS — R05 Cough: Secondary | ICD-10-CM | POA: Diagnosis not present

## 2018-02-03 LAB — POCT RAPID STREP A (OFFICE): Rapid Strep A Screen: NEGATIVE

## 2018-02-03 MED ORDER — PSEUDOEPHEDRINE HCL ER 120 MG PO TB12: 120.0000 mg | ORAL_TABLET | Freq: Two times a day (BID) | ORAL | 3 refills | Status: DC

## 2018-02-03 MED ORDER — HYDROCODONE-HOMATROPINE 5-1.5 MG/5ML PO SYRP: 5.0000 mL | ORAL_SOLUTION | Freq: Every evening | ORAL | 0 refills | Status: DC | PRN

## 2018-02-03 NOTE — Progress Notes (Signed)
   MRN: [id number] DOB: 05/05/1981  Subjective:   Amber Daniels is a 37 y.o. female presenting for 4 day history of sore throat, difficulty swallowing, sneezing. Has also had sinus pain, congestion, bilateral ear fullness and itching, productive cough that elicits chest pain, chest tightness, shob, wheezing. Denies fever, n/v, abdominal pain, rashes. She uses Mirena. Denies smoking cigarettes. Denies history of blood clots. Has a history of asthma, has been scheduling her inhaler twice daily.   Amber Daniels has a current medication list which includes the following prescription(s): acetaminophen, albuterol, beclomethasone, benzonatate, cetirizine, cyclobenzaprine, famotidine, fexofenadine, fluocinolone acetonide scalp, ibuprofen, ketoconazole, montelukast, omeprazole, pseudoephedrine, sucralfate, and triamcinolone. Also is allergic to other.  Amber Daniels  has a past medical history of Allergy, Anemia, Arthritis, Asthma, Edema, GERD (gastroesophageal reflux disease), Headache, Heart murmur, Hypertension, Morbid obesity (HCC), PONV (postoperative nausea and vomiting), Postpartum care following cesarean delivery (12/2) (11/01/2013), Shortness of breath dyspnea, and Sleep apnea. Also  has a past surgical history that includes Cesarean section; Cesarean section (N/A, 11/01/2013); Breath tek h pylori (N/A, 02/22/2015); and Laparoscopic gastric sleeve resection (N/A, 06/25/2015).  Objective:   Vitals: BP 128/84   Pulse 67   Temp 97.6 F (36.4 C) (Oral)   Resp 18   Ht 5\' 4"  (1.626 m)   Wt (!) 307 lb (139.3 kg)   SpO2 100%   BMI 52.70 kg/m   Physical Exam  Constitutional: She is oriented to person, place, and time. She appears well-developed and well-nourished.  HENT:  Right Ear: Tympanic membrane normal. No tenderness.  Left Ear: Tympanic membrane normal. No tenderness.  Mouth/Throat: No uvula swelling. No oropharyngeal exudate, posterior oropharyngeal edema or posterior oropharyngeal erythema.  Mild  bilateral maxillary sinus tenderness. Significant post-nasal drainage. No cervical adenopathy.  Cardiovascular: Normal rate, regular rhythm and intact distal pulses. Exam reveals no gallop and no friction rub.  No murmur heard. Pulmonary/Chest: No respiratory distress. She has no wheezes. She has no rales.  Neurological: She is alert and oriented to person, place, and time.  Skin: Skin is warm and dry.  Psychiatric: She has a normal mood and affect.    Results for orders placed or performed in visit on 02/03/18 (from the past 24 hour(s))  POCT rapid strep A     Status: Normal   Collection Time: 02/03/18  9:57 AM  Result Value Ref Range   Rapid Strep A Screen Negative Negative   Assessment and Plan :   Acute URI  Sore throat - Plan: POCT rapid strep A, Culture, Group A Strep  Sinus pain  Cough  Atypical chest pain  Shortness of breath  Mild persistent asthma without complication  Will start supportive care for viral illness. Counseled patient on potential for adverse effects with medications prescribed today, patient verbalized understanding. Return-to-clinic precautions discussed, patient verbalized understanding.   Wallis BambergMario Mikala Podoll, PA-C Primary Care at [facility] [phone] 02/03/2018  9:53 AM

## 2018-02-03 NOTE — Patient Instructions (Addendum)
Hydrate well with at least 2 liters (1 gallon) of water daily. For sore throat try using a honey-based tea. Use 3 teaspoons of honey with juice squeezed from half lemon. Place shaved pieces of ginger into 1/2-1 cup of water and warm over stove top. Then mix the ingredients and repeat every 4 hours as needed. Use Sudafed 120mg  twice daily as needed for post-nasal drainage, sinus congestion. Continue scheduling your albuterol inhaler twice daily for wheezing, shortness of breath.   Sore Throat When you have a sore throat, your throat may:  Hurt.  Burn.  Feel irritated.  Feel scratchy.  Many things can cause a sore throat, including:  An infection.  Allergies.  Dryness in the air.  Smoke or pollution.  Gastroesophageal reflux disease (GERD).  A tumor.  A sore throat can be the first sign of another sickness. It can happen with other problems, like coughing or a fever. Most sore throats go away without treatment. Follow these instructions at home:  Take over-the-counter medicines only as told by your doctor.  Drink enough fluids to keep your pee (urine) clear or pale yellow.  Rest when you feel you need to.  To help with pain, try: ? Sipping warm liquids, such as broth, herbal tea, or warm water. ? Eating or drinking cold or frozen liquids, such as frozen ice pops. ? Gargling with a salt-water mixture 3-4 times a day or as needed. To make a salt-water mixture, add -1 tsp of salt in 1 cup of warm water. Mix it until you cannot see the salt anymore. ? Sucking on hard candy or throat lozenges. ? Putting a cool-mist humidifier in your bedroom at night. ? Sitting in the bathroom with the door closed for 5-10 minutes while you run hot water in the shower.  Do not use any tobacco products, such as cigarettes, chewing tobacco, and e-cigarettes. If you need help quitting, ask your doctor. Contact a doctor if:  You have a fever for more than 2-3 days.  You keep having symptoms  for more than 2-3 days.  Your throat does not get better in 7 days.  You have a fever and your symptoms suddenly get worse. Get help right away if:  You have trouble breathing.  You cannot swallow fluids, soft foods, or your saliva.  You have swelling in your throat or neck that gets worse.  You keep feeling like you are going to throw up (vomit).  You keep throwing up. This information is not intended to replace advice given to you by your health care provider. Make sure you discuss any questions you have with your health care provider. Document Released: 08/26/2008 Document Revised: 07/13/2016 Document Reviewed: 09/07/2015 Elsevier Interactive Patient Education  2018 [employer]     IF you received an x-ray today, you will receive an invoice from [facility]. Please contact [facility] at [phone] with questions or concerns regarding your invoice.   IF you received labwork today, you will receive an invoice from [facility]. Please contact [facility] at [phone] with questions or concerns regarding your invoice.   Our billing staff will not be able to assist you with questions regarding bills from these companies.  You will be contacted with the lab results as soon as they are available. The fastest way to get your results is to activate your My Chart account. Instructions are located on the last page of this paperwork. If you have not heard from us regarding the results in 2 weeks, please  contact this office.

## 2018-02-05 LAB — CULTURE, GROUP A STREP: STREP A CULTURE: NEGATIVE

## 2018-03-26 ENCOUNTER — Ambulatory Visit: Payer: BC Managed Care – PPO | Admitting: Family Medicine

## 2018-04-30 ENCOUNTER — Other Ambulatory Visit: Payer: Self-pay

## 2018-04-30 ENCOUNTER — Encounter: Payer: Self-pay | Admitting: Physician Assistant

## 2018-04-30 ENCOUNTER — Ambulatory Visit: Payer: BC Managed Care – PPO | Admitting: Physician Assistant

## 2018-04-30 VITALS — BP 118/88 | HR 88 | Temp 98.9°F | Resp 16 | Ht 63.0 in | Wt 307.8 lb

## 2018-04-30 DIAGNOSIS — Z113 Encounter for screening for infections with a predominantly sexual mode of transmission: Secondary | ICD-10-CM

## 2018-04-30 DIAGNOSIS — N898 Other specified noninflammatory disorders of vagina: Secondary | ICD-10-CM

## 2018-04-30 DIAGNOSIS — R300 Dysuria: Secondary | ICD-10-CM | POA: Diagnosis not present

## 2018-04-30 DIAGNOSIS — R8290 Unspecified abnormal findings in urine: Secondary | ICD-10-CM | POA: Diagnosis not present

## 2018-04-30 LAB — POCT WET + KOH PREP
Trich by wet prep: ABSENT
YEAST BY KOH: ABSENT
Yeast by wet prep: ABSENT

## 2018-04-30 LAB — POCT URINALYSIS DIP (MANUAL ENTRY)
BILIRUBIN UA: NEGATIVE
Glucose, UA: NEGATIVE mg/dL
Ketones, POC UA: NEGATIVE mg/dL
Leukocytes, UA: NEGATIVE
NITRITE UA: NEGATIVE
Protein Ur, POC: NEGATIVE mg/dL
SPEC GRAV UA: 1.025 (ref 1.010–1.025)
UROBILINOGEN UA: 1 U/dL
pH, UA: 7 (ref 5.0–8.0)

## 2018-04-30 LAB — POC MICROSCOPIC URINALYSIS (UMFC): MUCUS RE: ABSENT

## 2018-04-30 NOTE — Progress Notes (Signed)
Patient ID: Amber Daniels, female    DOB: 11/23/1981, 37 y.o.   MRN: [id number]  PCP: Ofilia Neaslark, Michael L, PA-C  Chief Complaint  Patient presents with  . Vaginal Itching    x 3days, slight odor, on mirena with occasional spotting   . STD testing    Subjective:   Presents for evaluation of vaginal itching and mild vaginal odor for the past 3 days.  She would also like STD screening.  Vaginal discharge is white.  She does have some episodic spotting on Mirena.  Mirena has been in place for 3 to 4 years. Describes a tingling sensation in the vagina and burning with urination as well as suprapubic discomfort.  Sexually active with one female partner. History of trichomonas.   Review of Systems Constitutional: Positive for fatigue. Negative for chills and fever.  HENT: Positive for congestion, postnasal drip and rhinorrhea. Negative for sore throat.   Respiratory: Negative for cough, chest tightness and shortness of breath.   Cardiovascular: Positive for chest pain (felt like needed to "pop" chest). Negative for palpitations.  Gastrointestinal: Positive for abdominal pain. Negative for blood in stool, constipation, diarrhea, nausea and vomiting.  Genitourinary: Positive for dysuria, frequency, vaginal bleeding, vaginal discharge and vaginal pain. Negative for difficulty urinating, flank pain, hematuria and urgency.  Neurological: Positive for dizziness (room spinning) and headaches. Negative for numbness.       Patient Active Problem List   Diagnosis Date Noted  . 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 Obesity hypoventilation syndrome (HCC) 12/18/2014  . Snoring 12/18/2014  . Asthma with acute exacerbation 12/18/2014  . Morbid obesity (HCC) 11/17/2014  . HTN (hypertension) 11/17/2014  . Esophageal reflux 11/17/2014  . Bilateral edema of lower extremity 11/17/2014     Prior to Admission medications   Medication Sig Start Date End Date Taking?  Authorizing Provider  acetaminophen (TYLENOL) 500 MG tablet Take 1,500 mg by mouth every 6 (six) hours as needed. For pain.   Yes [provider]  albuterol (PROVENTIL HFA;VENTOLIN HFA) 108 (90 Base) MCG/ACT inhaler Inhale 2 puffs every 6 (six) hours as needed into the lungs for wheezing or shortness of breath. 10/07/17  Yes Collie SiadStallings, Zoe A, MD  beclomethasone (QVAR) 40 MCG/ACT inhaler Inhale 2 puffs into the lungs daily. 12/29/16  Yes Wallis BambergMani, Mario, PA-C  benzonatate (TESSALON) 100 MG capsule Take 1-2 capsules (100-200 mg total) 3 (three) times daily as needed by mouth. 10/07/17  Yes Doristine BosworthStallings, Zoe A, MD  cetirizine (ZYRTEC) 10 MG tablet Take 1 tablet (10 mg total) by mouth daily. 03/13/15  Yes Ofilia Neaslark, Michael L, PA-C  cyclobenzaprine (FLEXERIL) 5 MG tablet Take 1 tablet (5 mg total) by mouth 3 (three) times daily as needed for muscle spasms. 10/20/14  Yes Le, Thao P, DO  famotidine (PEPCID) 20 MG tablet Take 1 tablet (20 mg total) 2 (two) times daily by mouth. 10/07/17  Yes Stallings, Zoe A, MD  fexofenadine (ALLEGRA) 60 MG tablet Take 60 mg by mouth daily. As needed   Yes [provider]  FLUOCINOLONE ACETONIDE SCALP 0.01 % OIL Use 1 oz on scalp and work through scalp for 5 min daily  and then thoroughly rinse with water 11/08/15  Yes Le, Thao P, DO  HYDROcodone-homatropine (HYCODAN) 5-1.5 MG/5ML syrup Take 5 mLs by mouth at bedtime as needed. 02/03/18  Yes Wallis BambergMani, Mario, PA-C  ibuprofen (ADVIL,MOTRIN) 600 MG tablet Take 1 tablet (600 mg total) by mouth every 6 (six) hours  as needed for mild pain. 04/09/15  Yes Ofilia Neas, PA-C  ketoconazole (NIZORAL) 2 % shampoo Apply 1 application topically 2 (two) times a week. Lather affected eareas, leave on for 5 min then rinse. 01/12/15  Yes Le, Thao P, DO  montelukast (SINGULAIR) 10 MG tablet Take 1 tablet (10 mg total) at bedtime by mouth. 10/07/17  Yes Doristine Bosworth, MD  omeprazole (PRILOSEC) 20 MG capsule Take 1 capsule (20 mg total) daily by  mouth. 10/07/17  Yes Doristine Bosworth, MD  pseudoephedrine (SUDAFED 12 HOUR) 120 MG 12 hr tablet Take 1 tablet (120 mg total) by mouth 2 (two) times daily. 02/03/18  Yes Wallis Bamberg, PA-C  triamcinolone (NASACORT ALLERGY 24HR) 55 MCG/ACT AERO nasal inhaler Place 2 sprays into the nose daily. 03/13/15  Yes Ofilia Neas, PA-C  sucralfate (CARAFATE) 1 g tablet TAKE 1 TABLET (1 G TOTAL) 4 (FOUR) TIMES DAILY - WITH MEALS AND AT BEDTIME BY MOUTH. 12/16/17 02/14/18  Doristine Bosworth, MD     Allergies  Allergen Reactions  . Other     Seasonal and enviromental allergies (dust)       Objective:  Physical Exam  Constitutional: She is oriented to person, place, and time. She appears well-developed and well-nourished. She is active and cooperative. No distress.  BP 118/88   Pulse 88   Temp 98.9 F (37.2 C)   Resp 16   Ht 5\' 3"  (1.6 m)   Wt (!) 307 lb 12.8 oz (139.6 kg)   SpO2 99%   BMI 54.52 kg/m    Eyes: Conjunctivae are normal.  Pulmonary/Chest: Effort normal.  Abdominal: Hernia confirmed negative in the right inguinal area and confirmed negative in the left inguinal area.  Genitourinary: Pelvic exam was performed with patient supine. No labial fusion. There is no rash, tenderness, lesion or injury on the right labia. There is no rash, tenderness, lesion or injury on the left labia. Uterus is not deviated, not enlarged, not fixed and not tender. Cervix exhibits no motion tenderness, no discharge and no friability. Right adnexum displays no mass, no tenderness and no fullness. Left adnexum displays no mass, no tenderness and no fullness. No erythema, tenderness or bleeding (small amount of blood in the vault) in the vagina. No foreign body in the vagina. No signs of injury around the vagina. No vaginal discharge found.  Lymphadenopathy: No inguinal adenopathy noted on the right or left side.  Neurological: She is alert and oriented to person, place, and time.  Psychiatric: She has a normal mood  and affect. Her speech is normal and behavior is normal.       Results for orders placed or performed in visit on 04/30/18  POCT urinalysis dipstick  Result Value Ref Range   Color, UA yellow yellow   Clarity, UA cloudy (A) clear   Glucose, UA negative negative mg/dL   Bilirubin, UA negative negative   Ketones, POC UA negative negative mg/dL   Spec Grav, UA 4.098 1.191 - 1.025   Blood, UA moderate (A) negative   pH, UA 7.0 5.0 - 8.0   Protein Ur, POC negative negative mg/dL   Urobilinogen, UA 1.0 0.2 or 1.0 E.U./dL   Nitrite, UA Negative Negative   Leukocytes, UA Negative Negative  POCT Microscopic Urinalysis ([facility])  Result Value Ref Range   WBC,UR,HPF,POC Moderate (A) None WBC/hpf   RBC,UR,HPF,POC Few (A) None RBC/hpf   Bacteria Moderate (A) None, Too numerous to count   Mucus Absent Absent  Epithelial Cells, UR Per Microscopy Moderate (A) None, Too numerous to count cells/hpf  POCT Wet + KOH Prep  Result Value Ref Range   Yeast by KOH Absent Absent   Yeast by wet prep Absent Absent   WBC by wet prep Few Few   Clue Cells Wet Prep HPF POC None None   Trich by wet prep Absent Absent   Bacteria Wet Prep HPF POC Few Few   Epithelial Cells By [employer] Pref ([facility]) Moderate (A) None, Few, Too numerous to count   RBC,UR,HPF,POC None None RBC/hpf       Assessment & Plan:   1. Vaginal discharge No evidence of bacterial vaginosis or yeast vaginitis.  Await gonorrhea and Chlamydia tests. - GC/Chlamydia Probe Amp - POCT Wet + KOH Prep  2. Dysuria No evidence of UTI.  Likely due to vaginal irritation.  Await urine culture. - POCT urinalysis dipstick - POCT Microscopic Urinalysis ([facility])  3. Routine screening for STI (sexually transmitted infection) - HIV antibody - RPR  4. Abnormal urine findings - Urine Culture    Return if symptoms worsen or fail to improve.   Fernande Bras, PA-C Primary Care at [facility]

## 2018-04-30 NOTE — Patient Instructions (Addendum)
So far, your lab results are all normal. There is no cause for your symptoms. While we await the remaining results, please: increase your water intake to at least 64 ounces of water daily, apply an OTC baby diaper ointment (like Balmex or Desitin) several times daily, and try to let the vulva get good air circulation.    IF you received an x-ray today, you will receive an invoice from [facility]. Please contact [facility] at [phone] with questions or concerns regarding your invoice.   IF you received labwork today, you will receive an invoice from [place]. Please contact [facility] at [phone] with questions or concerns regarding your invoice.   Our billing staff will not be able to assist you with questions regarding bills from these companies.  You will be contacted with the lab results as soon as they are available. The fastest way to get your results is to activate your My Chart account. Instructions are located on the last page of this paperwork. If you have not heard from [place] regarding the results in 2 weeks, please contact this office.

## 2018-04-30 NOTE — Progress Notes (Signed)
   Subjective:    Patient ID: Amber Daniels, female    DOB: 01/06/1981, 37 y.o.   MRN: [id number]  Patient presenting for vaginal itching/pain that started 2 days ago (Wednesday). There is a strong odor and white discharge along with accompanying spotting. She admits to having a mirena for the past 3-4 years. She also notes a tingling sensation in the vagina and dysuria as well as suprapubic pain. The last time she had intercourse with her partner was Sunday morning. She has had trichomonas once in the past and does not know the STI history of her sexual partner.    Vaginal Itching  The patient's primary symptoms include vaginal discharge. Associated symptoms include abdominal pain, dysuria, frequency and headaches. Pertinent negatives include no chills, constipation, diarrhea, fever, flank pain, hematuria, nausea, sore throat, urgency or vomiting.      Review of Systems  Constitutional: Positive for fatigue. Negative for chills and fever.  HENT: Positive for congestion, postnasal drip and rhinorrhea. Negative for sore throat.   Respiratory: Negative for cough, chest tightness and shortness of breath.   Cardiovascular: Positive for chest pain (felt like needed to "pop" chest). Negative for palpitations.  Gastrointestinal: Positive for abdominal pain. Negative for blood in stool, constipation, diarrhea, nausea and vomiting.  Genitourinary: Positive for dysuria, frequency, vaginal bleeding, vaginal discharge and vaginal pain. Negative for difficulty urinating, flank pain, hematuria and urgency.  Neurological: Positive for dizziness (room spinning) and headaches. Negative for numbness.       Objective:   Physical Exam  Constitutional: She appears well-developed and well-nourished. No distress.  HENT:  Head: Normocephalic and atraumatic.  Eyes: Pupils are equal, round, and reactive to light.  Neck: No thyromegaly present.  Cardiovascular: Normal rate, regular rhythm and normal heart  sounds. Exam reveals no gallop and no friction rub.  No murmur heard. Pulmonary/Chest: Effort normal and breath sounds normal. She has no wheezes. She has no rales. She exhibits no tenderness.  Genitourinary: Vagina normal and uterus normal. Pelvic exam was performed with patient supine. Cervix exhibits discharge (greenish). Cervix exhibits no motion tenderness.  Lymphadenopathy:    She has no cervical adenopathy.  Skin: Skin is warm and dry.          Assessment & Plan:  -KOH prep benign -moderate blood in UA dipstick, so reflex culture -UA microscope analysis shows moderate epithelial cells, so unreliable dirty catch - reevaluate upon culture results -Clinical picture at this time pointing to external etiology vs. Internal. Patient appears to be reliable and is accommodating to alter treatment course based on pending labs. Reevaluate clinical picture and contact patient upon lab results. In the meantime, increase water intake to 64oz/daily, apply OTC baby diaper ointment to vagina, and increase air circulation to vulvar area when able.   1. Vaginal discharge - GC/Chlamydia Probe Amp - POCT Wet + KOH Prep  2. Dysuria - POCT urinalysis dipstick - POCT Microscopic Urinalysis ([facility])  3. Routine screening for STI (sexually transmitted infection) - HIV antibody - RPR  4. Abnormal urine findings - Urine Culture  Return if symptoms worsen or fail to improve.

## 2018-05-01 LAB — SPECIMEN STATUS

## 2018-05-01 LAB — URINE CULTURE

## 2018-05-02 LAB — GC/CHLAMYDIA PROBE AMP
Chlamydia trachomatis, NAA: NEGATIVE
Neisseria gonorrhoeae by PCR: NEGATIVE

## 2018-05-03 ENCOUNTER — Telehealth: Payer: Self-pay | Admitting: Physician Assistant

## 2018-05-03 LAB — HIV ANTIBODY (ROUTINE TESTING W REFLEX): HIV Screen 4th Generation wRfx: NONREACTIVE

## 2018-05-03 LAB — RPR: RPR Ser Ql: NONREACTIVE

## 2018-05-03 MED ORDER — FLUCONAZOLE 150 MG PO TABS: 150.0000 mg | ORAL_TABLET | Freq: Once | ORAL | 0 refills | Status: AC

## 2018-05-03 NOTE — Telephone Encounter (Signed)
Pt given results per notes of Chelle Jeffery, PA-C on 05/02/18. She says "I am still having burPorfirio Oarning and itching. I wonder if it is because I took Flagyl for 10 days about 2 weeks ago and it took up my good bacteria. What do I do about it now?" I [facility] and someone will call back with the recommendation, she verbalized understanding. Unable to document in result note due to result note not being routed to [facility].

## 2018-05-03 NOTE — Telephone Encounter (Signed)
Patient advised Voiced understanding 

## 2018-05-03 NOTE — Addendum Note (Signed)
Addended by: Fernande BrasJEFFERY, Lavra Imler S on: 05/03/2018 12:53 PM   Modules accepted: Orders

## 2018-05-03 NOTE — Telephone Encounter (Signed)
It is possible that the Flagyl she took previously has lead to a yeast infection, though there was no yeast on the swab collected at her visit. I have sent in a prescription for Diflucan. If her symptoms persist, she should return for re-evaluation.  Meds ordered this encounter  Medications  . fluconazole (DIFLUCAN) 150 MG tablet    Sig: Take 1 tablet (150 mg total) by mouth once for 1 dose. Repeat if needed    Dispense:  2 tablet    Refill:  0    Order Specific Question:   Supervising Provider    Answer:   Clelia CroftSHAW, EVA N [[id number]]

## 2018-05-26 ENCOUNTER — Other Ambulatory Visit: Payer: Self-pay | Admitting: Surgery

## 2018-05-26 DIAGNOSIS — Z903 Acquired absence of stomach [part of]: Secondary | ICD-10-CM

## 2018-05-31 ENCOUNTER — Other Ambulatory Visit: Payer: 59

## 2018-06-04 ENCOUNTER — Other Ambulatory Visit: Payer: 59

## 2018-06-09 ENCOUNTER — Ambulatory Visit
Admission: RE | Admit: 2018-06-09 | Discharge: 2018-06-09 | Disposition: A | Discharge status: Home / Self Care | Payer: BC Managed Care – PPO | Source: Ambulatory Visit | Attending: Surgery | Admitting: Surgery

## 2018-06-09 DIAGNOSIS — Z903 Acquired absence of stomach [part of]: Secondary | ICD-10-CM

## 2018-06-11 ENCOUNTER — Other Ambulatory Visit: Payer: Self-pay

## 2018-06-11 ENCOUNTER — Ambulatory Visit: Payer: BC Managed Care – PPO | Admitting: Physician Assistant

## 2018-06-11 ENCOUNTER — Encounter: Payer: Self-pay | Admitting: Physician Assistant

## 2018-06-11 VITALS — BP 123/79 | HR 92 | Temp 98.3°F | Resp 20 | Ht 63.78 in | Wt 308.0 lb

## 2018-06-11 DIAGNOSIS — N898 Other specified noninflammatory disorders of vagina: Secondary | ICD-10-CM

## 2018-06-11 DIAGNOSIS — K219 Gastro-esophageal reflux disease without esophagitis: Secondary | ICD-10-CM

## 2018-06-11 DIAGNOSIS — I10 Essential (primary) hypertension: Secondary | ICD-10-CM

## 2018-06-11 DIAGNOSIS — Z711 Person with feared health complaint in whom no diagnosis is made: Secondary | ICD-10-CM

## 2018-06-11 DIAGNOSIS — B3731 Acute candidiasis of vulva and vagina: Secondary | ICD-10-CM

## 2018-06-11 DIAGNOSIS — B373 Candidiasis of vulva and vagina: Secondary | ICD-10-CM

## 2018-06-11 DIAGNOSIS — E662 Morbid (severe) obesity with alveolar hypoventilation: Secondary | ICD-10-CM

## 2018-06-11 DIAGNOSIS — R300 Dysuria: Secondary | ICD-10-CM | POA: Diagnosis not present

## 2018-06-11 DIAGNOSIS — R3915 Urgency of urination: Secondary | ICD-10-CM | POA: Diagnosis not present

## 2018-06-11 DIAGNOSIS — R82998 Other abnormal findings in urine: Secondary | ICD-10-CM

## 2018-06-11 DIAGNOSIS — J45909 Unspecified asthma, uncomplicated: Secondary | ICD-10-CM

## 2018-06-11 LAB — POCT WET + KOH PREP: Trich by wet prep: ABSENT

## 2018-06-11 LAB — POCT URINALYSIS DIP (MANUAL ENTRY)
BILIRUBIN UA: NEGATIVE
BILIRUBIN UA: NEGATIVE mg/dL
Glucose, UA: NEGATIVE mg/dL
Nitrite, UA: NEGATIVE
PH UA: 6.5 (ref 5.0–8.0)
SPEC GRAV UA: 1.025 (ref 1.010–1.025)
Urobilinogen, UA: 0.2 E.U./dL

## 2018-06-11 MED ORDER — NITROFURANTOIN MONOHYD MACRO 100 MG PO CAPS: 100.0000 mg | ORAL_CAPSULE | Freq: Two times a day (BID) | ORAL | 0 refills | Status: AC

## 2018-06-11 MED ORDER — FLUCONAZOLE 150 MG PO TABS: 150.0000 mg | ORAL_TABLET | Freq: Once | ORAL | 0 refills | Status: AC

## 2018-06-11 NOTE — Progress Notes (Signed)
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  SUBJECTIVE  Amber Daniels is a 37 y.o. female who complains of dysuria, urinary urgency, genital irritation and vaginal discharge x 3 days. She denies hematuria, flank pain, abdominal pain, cloudy malordorous urine and genital rash. Has tried monostat with some relief.  Started after being sexually active with boyfriend. Would like STD testing. Has PMH of trichomonas, BV, yeast, and UTI. Last treated for trich one month ago. Sexually active with same partner, he has been treated as well. Has mirena placed for contraception.   She  has a past medical history of Allergy, Anemia, Arthritis, Asthma, Edema, GERD (gastroesophageal reflux disease), Headache, Heart murmur, Hypertension, Morbid obesity (HCC), PONV (postoperative nausea and vomiting), Postpartum care following cesarean delivery (12/2) (11/01/2013), Shortness of breath dyspnea, and Sleep apnea.    Medications reviewed and updated by myself where necessary, and exist elsewhere in the encounter.   Amber Daniels is allergic to other. She  reports that she has never smoked. She has never used smokeless tobacco. She reports that she drinks about 4.2 oz of alcohol per week. She reports that she does not use drugs. She  reports that she currently engages in sexual activity. She reports using the following method of birth control/protection: IUD. The patient  has a past surgical history that includes Cesarean section; Cesarean section (N/A, 11/01/2013); Breath tek h pylori (N/A, 02/22/2015); and Laparoscopic gastric sleeve resection (N/A, 06/25/2015).  Her family history includes Diabetes in her father and mother;  Hypertension in her brother, father, maternal grandfather, maternal grandmother, and mother.  Review of Systems  Constitutional: Negative for chills, fever and malaise/fatigue.    OBJECTIVE  Her  height is 5' 3.78" (1.62 m) and weight is 308 lb (139.7 kg) (abnormal). Her oral temperature is 98.3 F (36.8 C). Her blood pressure is 123/79 and her pulse is 92. Her respiration is 20 and oxygen saturation is 98%.  The patient's body mass index is 53.23 kg/m.  Physical Exam  Constitutional: She is oriented to person, place, and time. She appears well-developed and well-nourished. No distress.  HENT:  Head: Normocephalic and atraumatic.  Eyes: Conjunctivae are normal.  Neck: Normal range of motion.  Pulmonary/Chest: Effort normal.  Abdominal: Normal appearance. There is no tenderness.  Genitourinary: Cervix exhibits no discharge. There is erythema in the vagina. Vaginal discharge (thick white curdy discharge) found.  Genitourinary Comments: CMA chaperone present for GU exam.  Neurological: She is alert and oriented to person, place, and time.  Skin: Skin is warm and dry.  Psychiatric: She has a normal mood and affect.  Vitals reviewed.   Results for orders placed or performed in visit on 06/11/18 (from the past 24 hour(s))  POCT urinalysis dipstick     Status: Abnormal   Collection Time: 06/11/18  5:16 PM  Result Value Ref Range   Color, UA yellow yellow   Clarity, UA cloudy (A) clear   Glucose, UA negative negative mg/dL   Bilirubin, UA negative negative   Ketones, POC UA negative negative mg/dL   Spec Grav, UA 1.4781.025 2.9561.010 - 1.025   Blood, UA moderate (A) negative   pH, UA 6.5 5.0 - 8.0   Protein Ur, POC =30 (A) negative mg/dL  Urobilinogen, UA 0.2 0.2 or 1.0 E.U./dL   Nitrite, UA Negative Negative   Leukocytes, UA Large (3+) (A) Negative  POCT Wet + KOH Prep     Status: Abnormal   Collection Time: 06/11/18  5:21 PM  Result Value Ref Range   Yeast by KOH Present (A) Absent     Yeast by wet prep Present (A) Absent   WBC by wet prep Few Few   Clue Cells Wet Prep HPF POC Few (A) None   Trich by wet prep Absent Absent   Bacteria Wet Prep HPF POC Few Few   Epithelial Cells By [employer] ([facility]) Many (A) None, Few, Too numerous to count   RBC,UR,HPF,POC None None RBC/hpf    ASSESSMENT & PLAN  Amber Daniels was seen today for dysuria, vaginal itching and vaginal discharge.  Diagnoses and all orders for this visit:  Dysuria -     Cancel: Urinalysis, dipstick only -     POCT urinalysis dipstick  Leukocytes in urine -     Urine Culture -     nitrofurantoin, macrocrystal-monohydrate, (MACROBID) 100 MG capsule; Take 1 capsule (100 mg total) by mouth 2 (two) times daily for 5 days.  Vaginal discharge -     POCT Wet + KOH Prep  Yeast vaginitis -     fluconazole (DIFLUCAN) 150 MG tablet; Take 1 tablet (150 mg total) by mouth once for 1 dose.  Concern about STD in female without diagnosis -     HIV antibody -     RPR -     GC/Chlamydia Probe Amp([facility]) -     Cancel: Trichomonas vaginalis, RNA -     Hepatitis panel, acute    Hx, UA, and urine micro suggestive of both yeast infection and UTI. Will treat with diflucan and empirically for UTI with macrobid at this time. Urine cx pending. STD labs pending. The patient was advised to call or come back to clinic if she does not see an improvement in symptoms, or worsens with the above plan.   Benjiman Core, PA-C  Primary Care at [facility] 06/11/2018 6:12 PM

## 2018-06-11 NOTE — Patient Instructions (Addendum)
Your results indicate you have a UTI and yeast. I have given you a prescription for an antibiotic and diflucan. Please take with food. Take one now and one after you complete antibiotics. I have sent off a urine culture and we should have those results in 48 hours. If your symptoms worsen while you are awaiting these results or you develop fever, chills, flank pian, nausea and vomiting, please seek care immediately.    We should have your other lab work back within one week. Thank you for letting me participate in your health and well being.  Vaginal Yeast infection, Adult Vaginal yeast infection is a condition that causes soreness, swelling, and redness (inflammation) of the vagina. It also causes vaginal discharge. This is a common condition. Some women get this infection frequently. What are the causes? This condition is caused by a change in the normal balance of the yeast (candida) and bacteria that live in the vagina. This change causes an overgrowth of yeast, which causes the inflammation. What increases the risk? This condition is more likely to develop in:  Women who take antibiotic medicines.  Women who have diabetes.  Women who take birth control pills.  Women who are pregnant.  Women who douche often.  Women who have a weak defense (immune) system.  Women who have been taking steroid medicines for a long time.  Women who frequently wear tight clothing.  What are the signs or symptoms? Symptoms of this condition include:  White, thick vaginal discharge.  Swelling, itching, redness, and irritation of the vagina. The lips of the vagina (vulva) may be affected as well.  Pain or a burning feeling while urinating.  Pain during sex.  How is this diagnosed? This condition is diagnosed with a medical history and physical exam. This will include a pelvic exam. Your health care provider will examine a sample of your vaginal discharge under a microscope. Your health care  provider may send this sample for testing to confirm the diagnosis. How is this treated? This condition is treated with medicine. Medicines may be over-the-counter or prescription. You may be told to use one or more of the following:  Medicine that is taken orally.  Medicine that is applied as a cream.  Medicine that is inserted directly into the vagina (suppository).  Follow these instructions at home:  Take or apply over-the-counter and prescription medicines only as told by your health care provider.  Do not have sex until your health care provider has approved. Tell your sex partner that you have a yeast infection. That person should go to his or her health care provider if he or she develops symptoms.  Do not wear tight clothes, such as pantyhose or tight pants.  Avoid using tampons until your health care provider approves.  Eat more yogurt. This may help to keep your yeast infection from returning.  Try taking a sitz bath to help with discomfort. This is a warm water bath that is taken while you are sitting down. The water should only come up to your hips and should cover your buttocks. Do this 3-4 times per day or as told by your health care provider.  Do not douche.  Wear breathable, cotton underwear.  If you have diabetes, keep your blood sugar levels under control. Contact a health care provider if:  You have a fever.  Your symptoms go away and then return.  Your symptoms do not get better with treatment.  Your symptoms get worse.  You have  new symptoms.  You develop blisters in or around your vagina.  You have blood coming from your vagina and it is not your menstrual period.  You develop pain in your abdomen. This information is not intended to replace advice given to you by your health care provider. Make sure you discuss any questions you have with your health care provider. Document Released: 08/27/2005 Document Revised: 04/30/2016 Document Reviewed:  05/21/2015 Elsevier Interactive Patient Education  2018 [employer].  Urinary Tract Infection, Adult A urinary tract infection (UTI) is an infection of any part of the urinary tract. The urinary tract includes the:  Kidneys.  Ureters.  Bladder.  Urethra.  These organs make, store, and get rid of pee (urine) in the body. Follow these instructions at home:  Take over-the-counter and prescription medicines only as told by your doctor.  If you were prescribed an antibiotic medicine, take it as told by your doctor. Do not stop taking the antibiotic even if you start to feel better.  Avoid the following drinks: ? Alcohol. ? Caffeine. ? Tea. ? Carbonated drinks.  Drink enough fluid to keep your pee clear or pale yellow.  Keep all follow-up visits as told by your doctor. This is important.  Make sure to: ? Empty your bladder often and completely. Do not to hold pee for long periods of time. ? Empty your bladder before and after sex. ? Wipe from front to back after a bowel movement if you are female. Use each tissue one time when you wipe. Contact a doctor if:  You have back pain.  You have a fever.  You feel sick to your stomach (nauseous).  You throw up (vomit).  Your symptoms do not get better after 3 days.  Your symptoms go away and then come back. Get help right away if:  You have very bad back pain.  You have very bad lower belly (abdominal) pain.  You are throwing up and cannot keep down any medicines or water. This information is not intended to replace advice given to you by your health care provider. Make sure you discuss any questions you have with your health care provider. Document Released: 05/05/2008 Document Revised: 04/24/2016 Document Reviewed: 10/08/2015 Elsevier Interactive Patient Education  2018 [employer].    IF you received an x-ray today, you will receive an invoice from [facility]. Please contact [facility] at  [phone] with questions or concerns regarding your invoice.   IF you received labwork today, you will receive an invoice from [facility]. Please contact [facility] at [phone] with questions or concerns regarding your invoice.   Our billing staff will not be able to assist you with questions regarding bills from these companies.  You will be contacted with the lab results as soon as they are available. The fastest way to get your results is to activate your My Chart account. Instructions are located on the last page of this paperwork. If you have not heard from [place] regarding the results in 2 weeks, please contact this office.

## 2018-06-12 LAB — HEPATITIS PANEL, ACUTE
HEP A IGM: NEGATIVE
Hep B C IgM: NEGATIVE
Hepatitis B Surface Ag: NEGATIVE

## 2018-06-12 LAB — URINE CULTURE

## 2018-06-12 LAB — HIV ANTIBODY (ROUTINE TESTING W REFLEX): HIV Screen 4th Generation wRfx: NONREACTIVE

## 2018-06-12 LAB — RPR: RPR Ser Ql: NONREACTIVE

## 2018-06-13 LAB — GC/CHLAMYDIA PROBE AMP
CHLAMYDIA, DNA PROBE: NEGATIVE
Neisseria gonorrhoeae by PCR: NEGATIVE

## 2018-06-17 ENCOUNTER — Ambulatory Visit: Payer: 59 | Admitting: Registered"

## 2018-08-14 ENCOUNTER — Encounter: Payer: Self-pay | Admitting: Family Medicine

## 2018-08-14 ENCOUNTER — Ambulatory Visit: Payer: BC Managed Care – PPO | Admitting: Family Medicine

## 2018-08-14 ENCOUNTER — Other Ambulatory Visit: Payer: Self-pay

## 2018-08-14 VITALS — BP 123/81 | HR 70 | Temp 98.0°F | Resp 16 | Ht 63.19 in | Wt 312.0 lb

## 2018-08-14 DIAGNOSIS — J029 Acute pharyngitis, unspecified: Secondary | ICD-10-CM | POA: Diagnosis not present

## 2018-08-14 DIAGNOSIS — N898 Other specified noninflammatory disorders of vagina: Secondary | ICD-10-CM | POA: Diagnosis not present

## 2018-08-14 DIAGNOSIS — Z87898 Personal history of other specified conditions: Secondary | ICD-10-CM

## 2018-08-14 DIAGNOSIS — B9689 Other specified bacterial agents as the cause of diseases classified elsewhere: Secondary | ICD-10-CM

## 2018-08-14 DIAGNOSIS — N760 Acute vaginitis: Secondary | ICD-10-CM

## 2018-08-14 DIAGNOSIS — Z113 Encounter for screening for infections with a predominantly sexual mode of transmission: Secondary | ICD-10-CM | POA: Diagnosis not present

## 2018-08-14 DIAGNOSIS — B3749 Other urogenital candidiasis: Secondary | ICD-10-CM

## 2018-08-14 LAB — POCT URINALYSIS DIP (MANUAL ENTRY)
Bilirubin, UA: NEGATIVE
Glucose, UA: NEGATIVE mg/dL
Ketones, POC UA: NEGATIVE mg/dL
LEUKOCYTES UA: NEGATIVE
Nitrite, UA: NEGATIVE
PH UA: 7 (ref 5.0–8.0)
Protein Ur, POC: NEGATIVE mg/dL
RBC UA: NEGATIVE
Spec Grav, UA: 1.02 (ref 1.010–1.025)
Urobilinogen, UA: 0.2 E.U./dL

## 2018-08-14 LAB — POCT WET + KOH PREP
TRICH BY WET PREP: ABSENT
Yeast by wet prep: ABSENT

## 2018-08-14 LAB — POCT RAPID STREP A (OFFICE): Rapid Strep A Screen: NEGATIVE

## 2018-08-14 MED ORDER — METRONIDAZOLE 0.75 % VA GEL: 1.0000 | Freq: Two times a day (BID) | VAGINAL | 0 refills | Status: DC

## 2018-08-14 MED ORDER — FLUCONAZOLE 150 MG PO TABS: 150.0000 mg | ORAL_TABLET | Freq: Every day | ORAL | 0 refills | Status: DC | PRN

## 2018-08-14 MED ORDER — LIDOCAINE VISCOUS HCL 2 % MT SOLN: 15.0000 mL | OROMUCOSAL | 0 refills | Status: DC | PRN

## 2018-08-14 MED ORDER — AZITHROMYCIN 250 MG PO TABS: ORAL_TABLET | ORAL | 0 refills | Status: DC

## 2018-08-14 NOTE — Progress Notes (Signed)
Amber Daniels, is a 37 y.o. female  ZOX:[id number]  [id number]:[id number]  DOB - 1981-06-05  CC:  Chief Complaint  Patient presents with  . Vaginal Discharge   HPI: Amber Daniels is a 37 y.o. female is here today for evaluation of sore throat, concern for STD, and vaginal discharge.  Concerns related to today's visit:  Children tested positive for strep throat. She has been afebrile. She has not taken any medication for throat symptoms. The throat pain start x 4 days. She has not taken any medication for her current symptoms.   Vaginal discharge is reoccurring in the presence of menstrual cycle. She has been recently treated for trichomoniasis, candiasis infection, and urinary tract infection. She is requently screened for STD and requests  re-screening today. She has one sexual partner. No known exposure to STD.   She is also concern regarding weight management. In the past she has had an abnormal hemoglobin A1C (5.7%) , suffers from hypertension, and asthma.    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   Current medications: Current Outpatient Medications:  .  acetaminophen (TYLENOL) 500 MG tablet, Take 1,500 mg by mouth every 6 (six) hours as needed. For pain., Disp: , Rfl:  .  albuterol (PROVENTIL HFA;VENTOLIN HFA) 108 (90 Base) MCG/ACT inhaler, Inhale 2 puffs every 6 (six) hours as needed into the lungs for wheezing or shortness of breath., Disp: 18 g, Rfl: 5 .  beclomethasone (QVAR) 40 MCG/ACT inhaler, Inhale 2 puffs into the lungs daily., Disp: 1 Inhaler, Rfl: 1 .  cetirizine (ZYRTEC) 10 MG tablet, Take 1 tablet (10 mg total) by mouth daily., Disp: 30 tablet, Rfl: 3 .  cyclobenzaprine (FLEXERIL) 5 MG tablet, Take 1 tablet (5 mg total) by mouth 3  (three) times daily as needed for muscle spasms., Disp: 30 tablet, Rfl: 0 .  famotidine (PEPCID) 20 MG tablet, Take 1 tablet (20 mg total) 2 (two) times daily by mouth., Disp: , Rfl:  .  fexofenadine (ALLEGRA) 60 MG tablet, Take 60 mg by mouth daily. As needed, Disp: , Rfl:  .  FLUOCINOLONE ACETONIDE SCALP 0.01 % OIL, Use 1 oz on scalp and work through scalp for 5 min daily  and then thoroughly rinse with water, Disp: 118 mL, Rfl: 1 .  ibuprofen (ADVIL,MOTRIN) 600 MG tablet, Take 1 tablet (600 mg total) by mouth every 6 (six) hours as needed for mild pain., Disp: 30 tablet, Rfl: 1 .  ketoconazole (NIZORAL) 2 % shampoo, Apply 1 application topically 2 (two) times a week. Lather affected eareas, leave on for 5 min then rinse., Disp: 120 mL, Rfl: 0 .  montelukast (SINGULAIR) 10 MG tablet, Take 1 tablet (10 mg total) at bedtime by mouth., Disp: 30 tablet, Rfl: 0 .  omeprazole (PRILOSEC) 20 MG capsule, Take 1 capsule (20 mg total) daily by mouth., Disp: 30 capsule, Rfl: 3 .  pseudoephedrine (SUDAFED 12 HOUR) 120 MG 12 hr tablet, Take 1 tablet (120 mg total) by mouth 2 (two) times daily., Disp: 30 tablet, Rfl: 3 .  triamcinolone (NASACORT ALLERGY 24HR) 55 MCG/ACT AERO nasal inhaler, Place 2 sprays into the nose daily., Disp: 1 Inhaler, Rfl: 12 .  sucralfate (CARAFATE) 1 g tablet, TAKE 1 TABLET (1 G TOTAL) 4 (FOUR) TIMES DAILY - WITH MEALS AND AT BEDTIME BY MOUTH., Disp: 240 tablet, Rfl: 0  Pertinent family medical history: family history includes Diabetes in her father and mother; Hypertension in her brother, father, maternal grandfather, maternal grandmother, and mother.    Allergies  Allergen Reactions  . Other     Seasonal and enviromental allergies (dust)    Social History   Socioeconomic History  . Marital status: Single    Spouse name: Not on file  . Number of children: Not on file  . Years of education: Not on file  . Highest education level: Not on file  Occupational History  .  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 Financial resource strain: Not on file  . Food insecurity:    Worry: Not on file    Inability: Not on file  . Transportation needs:    Medical: Not on file    Non-medical: Not on file  Tobacco Use  . Smoking status: Never Smoker  . Smokeless tobacco: Never Used  Substance and Sexual Activity  . Alcohol use: Yes    Alcohol/week: 7.0 standard drinks    Types: 7 Glasses of wine per week  . Drug use: No  . Sexual activity: Yes    Birth control/protection: IUD  Lifestyle  . Physical activity:    Days per week: Not on file    Minutes per session: Not on file  . Stress: Not on file  Relationships  . Social connections:    Talks on phone: Not on file    Gets together: Not on file    Attends religious service: Not on file    Active member of club or organization: Not on file    Attends meetings of clubs or organizations: Not on file    Relationship status: Not on file  . Intimate partner violence:    Fear of current or ex partner: Not on file    Emotionally abused: Not on file    Physically abused: Not on file    Forced sexual activity: Not on file  Other Topics Concern  . Not on file  Social History Narrative   Caffeine none.  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     Review of Systems: Constitutional: Negative for fever, chills, diaphoresis, activity change, appetite change and fatigue. Respiratory: Negative for cough, choking, chest tightness, shortness of breath, wheezing and stridor.  Cardiovascular: Negative for chest pain, palpitations and leg swelling. Gastrointestinal: Negative for abdominal distention. Genitourinary: Negative for dysuria, urgency, frequency, hematuria, flank pain, decreased urine volume, difficulty urinating and dyspareunia. positive for vaginal discharge Neurological: Negative for dizziness, tremors, seizures, syncope, facial asymmetry, speech difficulty, weakness, light-headedness, numbness and  headaches.  Objective:   Vitals:   08/14/18 0938  BP: 123/81  Pulse: 70  Resp: 16  Temp: 98 F (36.7 C)  SpO2: 99%   Physical Exam: General appearance: alert, well developed, well nourished, cooperative and in no distress Head: Normocephalic, without obvious abnormality, atraumatic Respiratory: Respirations even and unlabored, normal respiratory rate Extremities: No gross deformities Skin: Skin color, texture, turgor normal. No rashes seen  Psych: Appropriate mood and affect. Neurologic: Mental status: Alert, oriented to person, place, and time, thought content appropriate.  Lab Results  Component Value Date   CREATININE 0.68 08/14/2018   BUN 10 08/14/2018   NA 142 08/14/2018   K 4.1 08/14/2018   CL 104 08/14/2018   CO2 25 08/14/2018    Lab Results  Component Value Date   HGBA1C 5.4 08/14/2018       Assessment and plan:  1. Vaginal discharge, will trial Diflucan 150 mg while wet prep results are pending. I reviewed prior wet prep results, which were significant for clue cells. Patient doesn't recall prior treatment for BV. Prescribing metronidazole gel times twice a day x4 days.  No improvement patient advised to follow-up here in office.  2. Screening examination for STD (sexually transmitted disease) - GC/Chlamydia Probe Amp([facility]), pending.  3. Sore throat. - POCT rapid strep A, negative. Culture, Group A Strep, pending. Will treat with Start Azithromycin Take 2 tabs x 1 dose, then 1 tab every day for x 4 days.  4. Morbid obesity (HCC). Last A1C 5.8.  Hemoglobin A1C 12/2014 abnormal. Current Body mass index is 54.94 kg/m. Given patient's comorbid conditions and weight, I am placing a referral to ref to Medical Weight Management with Dr. Quillian Quincearen Beasley, MD.   5. History of prediabetes, UA negative of any glucose/ketones.  BMP pending.   Meds ordered this encounter  Medications  . fluconazole (DIFLUCAN) 150 MG tablet    Sig: Take 1 tablet (150 mg total) by  mouth daily as needed (take at least 3 days between doses as needed for vaginal irritaiton).    Dispense:  10 tablet    Refill:  0  . azithromycin (ZITHROMAX) 250 MG tablet    Sig: Take 2 tabs PO x 1 dose, then 1 tab PO QD x 4 days    Dispense:  6 tablet    Refill:  0  . lidocaine (XYLOCAINE) 2 % solution    Sig: Use as directed 15 mLs in the mouth or throat as needed for mouth pain.    Dispense:  100 mL    Refill:  0  . metroNIDAZOLE (METROGEL VAGINAL) 0.75 % vaginal gel    Sig: Place 1 Applicatorful vaginally 2 (two) times daily.    Dispense:  70 g    Refill:  0    The patient was given clear instructions to go to ER or return to medical center if symptoms don't improve, worsen or new problems develop. The patient verbalized understanding. The patient was told to call to get lab results if they haven't heard anything in the next week.     This note has been created with Education officer, environmentalDragon speech recognition software and smart phrase technology. Any transcriptional errors are unintentional.    Godfrey PickKimberly S. Tiburcio PeaHarris, FNP-C Nurse Practitioner (PRN Staff)  [facility] [address] [place], [place]  [phone]

## 2018-08-14 NOTE — Patient Instructions (Addendum)
A referral has been placed for weight management referral. If you have not received an appointment, please contact Dr. Quillian Quince Office at [phone].    Bacterial Vaginosis Bacterial vaginosis is an infection of the vagina. It happens when too many germs (bacteria) grow in the vagina. This infection puts you at risk for infections from sex (STIs). Treating this infection can lower your risk for some STIs. You should also treat this if you are pregnant. It can cause your baby to be born early. Follow these instructions at home: Medicines  Take over-the-counter and prescription medicines only as told by your doctor.  Take or use your antibiotic medicine as told by your doctor. Do not stop taking or using it even if you start to feel better. General instructions  If you your sexual partner is a woman, tell her that you have this infection. She needs to get treatment if she has symptoms. If you have a female partner, he does not need to be treated.  During treatment: ? Avoid sex. ? Do not douche. ? Avoid alcohol as told. ? Avoid breastfeeding as told.  Drink enough fluid to keep your pee (urine) clear or pale yellow.  Keep your vagina and butt (rectum) clean. ? Wash the area with warm water every day. ? Wipe from front to back after you use the toilet.  Keep all follow-up visits as told by your doctor. This is important. Preventing this condition  Do not douche.  Use only warm water to wash around your vagina.  Use protection when you have sex. This includes: ? Latex condoms. ? Dental dams.  Limit how many people you have sex with. It is best to only have sex with the same person (be monogamous).  Get tested for STIs. Have your partner get tested.  Wear underwear that is cotton or lined with cotton.  Avoid tight pants and pantyhose. This is most important in summer.  Do not use any products that have nicotine or tobacco in them. These include cigarettes and  e-cigarettes. If you need help quitting, ask your doctor.  Do not use illegal drugs.  Limit how much alcohol you drink. Contact a doctor if:  Your symptoms do not get better, even after you are treated.  You have more discharge or pain when you pee (urinate).  You have a fever.  You have pain in your belly (abdomen).  You have pain with sex.  Your bleed from your vagina between periods. Summary  This infection happens when too many germs (bacteria) grow in the vagina.  Treating this condition can lower your risk for some infections from sex (STIs).  You should also treat this if you are pregnant. It can cause early (premature) birth.  Do not stop taking or using your antibiotic medicine even if you start to feel better. This information is not intended to replace advice given to you by your health care provider. Make sure you discuss any questions you have with your health care provider. Document Released: 08/26/2008 Document Revised: 08/02/2016 Document Reviewed: 08/02/2016 Elsevier Interactive Patient Education  2017 Elsevier Inc.  Vaginal Yeast infection, Adult Vaginal yeast infection is a condition that causes soreness, swelling, and redness (inflammation) of the vagina. It also causes vaginal discharge. This is a common condition. Some women get this infection frequently. What are the causes? This condition is caused by a change in the normal balance of the yeast (candida) and bacteria that live in the vagina. This change causes an overgrowth  of yeast, which causes the inflammation. What increases the risk? This condition is more likely to develop in:  Women who take antibiotic medicines.  Women who have diabetes.  Women who take birth control pills.  Women who are pregnant.  Women who douche often.  Women who have a weak defense (immune) system.  Women who have been taking steroid medicines for a long time.  Women who frequently wear tight clothing.  What  are the signs or symptoms? Symptoms of this condition include:  White, thick vaginal discharge.  Swelling, itching, redness, and irritation of the vagina. The lips of the vagina (vulva) may be affected as well.  Pain or a burning feeling while urinating.  Pain during sex.  How is this diagnosed? This condition is diagnosed with a medical history and physical exam. This will include a pelvic exam. Your health care provider will examine a sample of your vaginal discharge under a microscope. Your health care provider may send this sample for testing to confirm the diagnosis. How is this treated? This condition is treated with medicine. Medicines may be over-the-counter or prescription. You may be told to use one or more of the following:  Medicine that is taken orally.  Medicine that is applied as a cream.  Medicine that is inserted directly into the vagina (suppository).  Follow these instructions at home:  Take or apply over-the-counter and prescription medicines only as told by your health care provider.  Do not have sex until your health care provider has approved. Tell your sex partner that you have a yeast infection. That person should go to his or her health care provider if he or she develops symptoms.  Do not wear tight clothes, such as pantyhose or tight pants.  Avoid using tampons until your health care provider approves.  Eat more yogurt. This may help to keep your yeast infection from returning.  Try taking a sitz bath to help with discomfort. This is a warm water bath that is taken while you are sitting down. The water should only come up to your hips and should cover your buttocks. Do this 3-4 times per day or as told by your health care provider.  Do not douche.  Wear breathable, cotton underwear.  If you have diabetes, keep your blood sugar levels under control. Contact a health care provider if:  You have a fever.  Your symptoms go away and then  return.  Your symptoms do not get better with treatment.  Your symptoms get worse.  You have new symptoms.  You develop blisters in or around your vagina.  You have blood coming from your vagina and it is not your menstrual period.  You develop pain in your abdomen. This information is not intended to replace advice given to you by your health care provider. Make sure you discuss any questions you have with your health care provider. Document Released: 08/27/2005 Document Revised: 04/30/2016 Document Reviewed: 05/21/2015 Elsevier Interactive Patient Education  [employer]2018 Elsevier Inc.    If you have lab work done today you will be contacted with your lab results within the next 2 weeks.  If you have not heard from us then please contact us. The fastest way to get your results is to register for My Chart.   IF you received an x-ray today, you will receive an invoice from [facility]. Please contact [facility] at [phone] with questions or concerns regarding your invoice.   IF you received labwork today, you will receive  an Pharmacologist from [employer]. Please contact [place] at [phone] with questions or concerns regarding your invoice.   Our billing staff will not be able to assist you with questions regarding bills from these companies.  You will be contacted with the lab results as soon as they are available. The fastest way to get your results is to activate your My Chart account. Instructions are located on the last page of this paperwork. If you have not heard from [place] regarding the results in 2 weeks, please contact this office.

## 2018-08-15 LAB — BASIC METABOLIC PANEL
BUN/Creatinine Ratio: 15 (ref 9–23)
BUN: 10 mg/dL (ref 6–20)
CALCIUM: 9.3 mg/dL (ref 8.7–10.2)
CO2: 25 mmol/L (ref 20–29)
Chloride: 104 mmol/L (ref 96–106)
Creatinine, Ser: 0.68 mg/dL (ref 0.57–1.00)
GFR calc Af Amer: 128 mL/min/{1.73_m2} (ref >59)
GFR calc non Af Amer: 111 mL/min/{1.73_m2} (ref >59)
GLUCOSE: 85 mg/dL (ref 65–99)
POTASSIUM: 4.1 mmol/L (ref 3.5–5.2)
Sodium: 142 mmol/L (ref 134–144)

## 2018-08-15 LAB — HEMOGLOBIN A1C
ESTIMATED AVERAGE GLUCOSE: 108 mg/dL
HEMOGLOBIN A1C: 5.4 % (ref 4.8–5.6)

## 2018-08-16 LAB — CULTURE, GROUP A STREP: Strep A Culture: NEGATIVE

## 2018-08-18 LAB — GC/CHLAMYDIA PROBE AMP
CHLAMYDIA, DNA PROBE: NEGATIVE
NEISSERIA GONORRHOEAE BY PCR: NEGATIVE

## 2018-09-04 ENCOUNTER — Ambulatory Visit: Payer: BC Managed Care – PPO | Admitting: Family Medicine

## 2018-10-11 ENCOUNTER — Encounter

## 2018-10-12 ENCOUNTER — Encounter (INDEPENDENT_AMBULATORY_CARE_PROVIDER_SITE_OTHER): Payer: BC Managed Care – PPO

## 2018-10-12 ENCOUNTER — Ambulatory Visit: Payer: BC Managed Care – PPO | Admitting: Physician Assistant

## 2018-10-13 ENCOUNTER — Ambulatory Visit: Payer: BC Managed Care – PPO | Admitting: Physician Assistant

## 2018-10-14 ENCOUNTER — Ambulatory Visit: Payer: BC Managed Care – PPO | Admitting: Physician Assistant

## 2018-10-20 ENCOUNTER — Encounter (INDEPENDENT_AMBULATORY_CARE_PROVIDER_SITE_OTHER): Payer: Self-pay | Admitting: Bariatrics

## 2018-10-20 ENCOUNTER — Ambulatory Visit (INDEPENDENT_AMBULATORY_CARE_PROVIDER_SITE_OTHER): Payer: BC Managed Care – PPO | Admitting: Bariatrics

## 2018-10-20 VITALS — BP 116/81 | HR 65 | Temp 97.8°F | Ht 63.0 in | Wt 310.0 lb

## 2018-10-20 DIAGNOSIS — Z9884 Bariatric surgery status: Secondary | ICD-10-CM

## 2018-10-20 DIAGNOSIS — Z9189 Other specified personal risk factors, not elsewhere classified: Secondary | ICD-10-CM | POA: Diagnosis not present

## 2018-10-20 DIAGNOSIS — Z1331 Encounter for screening for depression: Secondary | ICD-10-CM

## 2018-10-20 DIAGNOSIS — R0602 Shortness of breath: Secondary | ICD-10-CM

## 2018-10-20 DIAGNOSIS — I10 Essential (primary) hypertension: Secondary | ICD-10-CM | POA: Diagnosis not present

## 2018-10-20 DIAGNOSIS — Z6841 Body Mass Index (BMI) 40.0 and over, adult: Secondary | ICD-10-CM

## 2018-10-20 DIAGNOSIS — R5383 Other fatigue: Secondary | ICD-10-CM | POA: Diagnosis not present

## 2018-10-20 DIAGNOSIS — R011 Cardiac murmur, unspecified: Secondary | ICD-10-CM

## 2018-10-20 DIAGNOSIS — Z0289 Encounter for other administrative examinations: Secondary | ICD-10-CM

## 2018-10-21 ENCOUNTER — Telehealth (INDEPENDENT_AMBULATORY_CARE_PROVIDER_SITE_OTHER): Payer: Self-pay | Admitting: Dietician

## 2018-10-21 NOTE — Telephone Encounter (Signed)
Pt was seen yesterday; is asking if she can drink Vitamin Water. Please advise.

## 2018-10-22 LAB — CBC WITH DIFFERENTIAL/PLATELET
BASOS ABS: 0 10*3/uL (ref 0.0–0.2)
Basos: 0 %
EOS (ABSOLUTE): 0 10*3/uL (ref 0.0–0.4)
EOS: 1 %
Hematocrit: 38.7 % (ref 34.0–46.6)
Hemoglobin: 12.2 g/dL (ref 11.1–15.9)
Immature Grans (Abs): 0 10*3/uL (ref 0.0–0.1)
Immature Granulocytes: 0 %
Lymphocytes Absolute: 1.2 10*3/uL (ref 0.7–3.1)
Lymphs: 22 %
MCH: 27 pg (ref 26.6–33.0)
MCHC: 31.5 g/dL (ref 31.5–35.7)
MCV: 86 fL (ref 79–97)
MONOS ABS: 0.4 10*3/uL (ref 0.1–0.9)
Monocytes: 8 %
NEUTROS PCT: 69 %
Neutrophils Absolute: 3.7 10*3/uL (ref 1.4–7.0)
PLATELETS: 229 10*3/uL (ref 150–450)
RBC: 4.52 x10E6/uL (ref 3.77–5.28)
RDW: 13.8 % (ref 12.3–15.4)
WBC: 5.4 10*3/uL (ref 3.4–10.8)

## 2018-10-22 LAB — VITAMIN D 25 HYDROXY (VIT D DEFICIENCY, FRACTURES): Vit D, 25-Hydroxy: 9.2 ng/mL — ABNORMAL LOW (ref 30.0–100.0)

## 2018-10-22 LAB — TSH: TSH: 1.32 u[IU]/mL (ref 0.450–4.500)

## 2018-10-22 LAB — T4, FREE: FREE T4: 1.04 ng/dL (ref 0.82–1.77)

## 2018-10-22 LAB — VITAMIN B12: VITAMIN B 12: 664 pg/mL (ref 232–1245)

## 2018-10-22 LAB — T3: T3 TOTAL: 116 ng/dL (ref 71–180)

## 2018-10-22 LAB — INSULIN, RANDOM: INSULIN: 15.1 u[IU]/mL (ref 2.6–24.9)

## 2018-10-25 ENCOUNTER — Encounter (INDEPENDENT_AMBULATORY_CARE_PROVIDER_SITE_OTHER): Payer: Self-pay

## 2018-10-25 ENCOUNTER — Encounter (INDEPENDENT_AMBULATORY_CARE_PROVIDER_SITE_OTHER): Payer: Self-pay | Admitting: Bariatrics

## 2018-10-25 DIAGNOSIS — R011 Cardiac murmur, unspecified: Secondary | ICD-10-CM | POA: Insufficient documentation

## 2018-10-25 NOTE — Progress Notes (Signed)
Office: [phone]  /  Fax: [phone]   Dear Amber PickKimberly S. Tiburcio PeaHarris, FNP,   Thank you for referring Amber BorsShakeita Hursey to our clinic. The following note includes my evaluation and treatment recommendations.  HPI:   Chief Complaint: OBESITY    Amber BorsShakeita Daniels has been referred by Amber PickKimberly S. Tiburcio PeaHarris, FNP for consultation regarding her obesity and obesity related comorbidities.    Amber Daniels (MR# [id number]) is a 37 y.o. female who presents on 10/25/2018 for obesity evaluation and treatment. Current BMI is Body mass index is 54.91 kg/m.Marland Kitchen. Amber Daniels has been struggling with her weight for many years and has been unsuccessful in either losing weight, maintaining weight loss, or reaching her healthy weight goal.     Amber Daniels attended our information session and states she is currently in the action stage of change and ready to dedicate time achieving and maintaining a healthier weight. Amber Daniels is interested in becoming our patient and working on intensive lifestyle modifications including (but not limited to) diet, exercise and weight loss.    Amber Daniels states her family eats meals together she thinks her family will eat healthier with  her she struggles with family and or coworkers weight loss sabotage her desired weight loss is 112 lbs she has been heavy most of  her life she started gaining weight last year her heaviest weight ever was 371 lbs. she states she craves carbohydrates primarily at night she tends to skip breakfast she is frequently drinking liquids with calories she frequently makes poor food choices she frequently eats larger portions than normal  she has binge eating behaviors   Fatigue Monroe feels her energy is lower than it should be. This has worsened with weight gain and has not worsened recently. Amber Daniels admits to daytime somnolence and she admits to waking up still tired. Patient is at risk for obstructive sleep apnea. Patent has a history of symptoms of  daytime fatigue, morning fatigue, morning headache and hypertension. Patient generally gets 6 hours of sleep per night, and states they generally have restless sleep. Snoring is present. Apneic episodes are present. Epworth Sleepiness Score is 4  Dyspnea on exertion Deerica notes increasing shortness of breath with exercising and seems to be worsening over time with weight gain. She notes getting out of breath sooner with activity than she used to. This has not gotten worse recently. Amber Daniels has a history of obesity hypoventilation syndrome. She has a history of asthma, allergy triggered. She uses an inhaler periodically. Amber Daniels denies orthopnea.  Hypertension Amber Daniels Raechel Daniels is a 37 y.o. female with hypertension. She is not on medications and this resolved with gastric sleeve. Amber BorsShakeita Kapur denies lightheadedness. She is working weight loss to help control her blood pressure with the goal of decreasing her risk of heart attack and stroke. Shakeitas blood pressure is well controlled.  At risk for cardiovascular disease Amber Daniels is at a higher than average risk for cardiovascular disease due to obesity and hypertension. She currently denies any chest pain.  Laparoscopic Gastric Sleeve (July 2016) Amber Daniels has a [facility] Dr. Luretha MurphyMatthew Martin (seen this year). She is not currently on supplements. Her heaviest weight was 371 pounds and her lowest weight was 266 pounds (still has restriction).  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 She saw her cardiologist several years ago.  Depression Screen Ashrita's Food and Mood (modified PHQ-9) score was  Depression screen PHQ 2/9 10/20/2018  Decreased Interest 3  Down,  Depressed, Hopeless 3  PHQ - 2 Score 6  Altered sleeping 1  Tired, decreased energy 2  Change in appetite 2  Feeling bad or failure about yourself  3  Trouble concentrating 3  Moving slowly or  fidgety/restless 1  Suicidal thoughts 0  PHQ-9 Score 18  Difficult doing work/chores Somewhat difficult    ALLERGIES: Allergies  Allergen Reactions  . Other     Seasonal and enviromental allergies (dust)    MEDICATIONS: Current Outpatient Medications on File Prior to Visit  Medication Sig Dispense Refill  . acetaminophen (TYLENOL) 500 MG tablet Take 1,500 mg by mouth every 6 (six) hours as needed. For pain.    Marland Kitchen albuterol (PROVENTIL HFA;VENTOLIN HFA) 108 (90 Base) MCG/ACT inhaler Inhale 2 puffs every 6 (six) hours as needed into the lungs for wheezing or shortness of breath. 18 g 5  . beclomethasone (QVAR) 40 MCG/ACT inhaler Inhale 2 puffs into the lungs daily. 1 Inhaler 1  . cetirizine (ZYRTEC) 10 MG tablet Take 10 mg by mouth daily.    . famotidine (PEPCID) 20 MG tablet Take 1 tablet (20 mg total) 2 (two) times daily by mouth.    . fexofenadine (ALLEGRA) 180 MG tablet Take 180 mg by mouth daily.    . fluconazole (DIFLUCAN) 150 MG tablet Take 1 tablet (150 mg total) by mouth daily as needed (take at least 3 days between doses as needed for vaginal irritaiton). 10 tablet 0  . Fluocinolone Acetonide Scalp 0.01 % OIL Apply topically.    Marland Kitchen ketoconazole (NIZORAL) 2 % shampoo Apply 1 application topically 2 (two) times a week.    . metroNIDAZOLE (METROGEL) 0.75 % gel Apply 1 application topically 2 (two) times daily.    . montelukast (SINGULAIR) 10 MG tablet Take 1 tablet (10 mg total) at bedtime by mouth. 30 tablet 0  . omeprazole (PRILOSEC) 20 MG capsule Take 1 capsule (20 mg total) daily by mouth. 30 capsule 3  . pseudoephedrine (SUDAFED 12 HOUR) 120 MG 12 hr tablet Take 1 tablet (120 mg total) by mouth 2 (two) times daily. 30 tablet 3  . sucralfate (CARAFATE) 1 g tablet TAKE 1 TABLET (1 G TOTAL) 4 (FOUR) TIMES DAILY - WITH MEALS AND AT BEDTIME BY MOUTH. 240 tablet 0  . triamcinolone (NASACORT ALLERGY 24HR CHILDREN) 55 MCG/ACT AERO nasal inhaler Place 2 sprays into the nose daily.      No current facility-administered medications on file prior to visit.     PAST MEDICAL HISTORY: Past Medical History:  Diagnosis Date  . Allergy   . Anemia    during pregnancy   . Arthritis   . Asthma   . Depression   . Edema    lower extremities   . GERD (gastroesophageal reflux disease)   . Headache    occasional migraine   . Heart murmur   . Hypertension   . Morbid obesity (HCC)   . Osteoarthritis   . PONV (postoperative nausea and vomiting)   . Postpartum care following cesarean delivery (12/2) 11/01/2013  . Prediabetes   . Shortness of breath dyspnea    only asthma related   . Sleep apnea    not on CPAP, dx 01/2013 with Outpatietn overnight study at home.   . Swelling     PAST SURGICAL HISTORY: Past Surgical History:  Procedure Laterality Date  . BREATH TEK H PYLORI N/A 02/22/2015   Procedure: BREATH TEK H PYLORI;  Surgeon: Luretha Murphy, MD;  Location: [facility];  Service:  General;  Laterality: N/A;  . CESAREAN SECTION    . CESAREAN SECTION N/A 11/01/2013   Procedure: REPEAT CESAREAN SECTION;  Surgeon: Serita Kyle, MD;  Location: [facility];  Service: Obstetrics;  Laterality: N/A;  EDD: 11/04/13  . LAPAROSCOPIC GASTRIC SLEEVE RESECTION N/A 06/25/2015   Procedure: LAPAROSCOPIC GASTRIC SLEEVE RESECTION;  Surgeon: Luretha Murphy, MD;  Location: [facility];  Service: General;  Laterality: N/A;  . WISDOM TOOTH EXTRACTION      SOCIAL HISTORY: Social History   Tobacco Use  . Smoking status: Never Smoker  . Smokeless tobacco: Never Used  Substance Use Topics  . Alcohol use: Yes    Alcohol/week: 7.0 standard drinks    Types: 7 Glasses of wine per week  . Drug use: No    FAMILY HISTORY: Family History  Problem Relation Age of Onset  . Diabetes Mother   . Hypertension Mother   . Depression Mother   . Anxiety disorder Mother   . Sleep apnea Mother   . Obesity Mother   . Diabetes Father   . Hypertension Father   . Hyperlipidemia Father   . Heart disease  Father   . Anxiety disorder Father   . Obesity Father   . Hypertension Brother   . Hypertension Maternal Grandmother   . Hypertension Maternal Grandfather     ROS: Review of Systems  Constitutional: Positive for malaise/fatigue.  Respiratory: Positive for shortness of breath (on exertion).   Cardiovascular: Negative for chest pain and orthopnea.  Neurological:       Negative for lightheadedness    PHYSICAL EXAM: Blood pressure 116/81, pulse 65, temperature 97.8 F (36.6 C), temperature source Oral, height 5\' 3"  (1.6 m), weight (!) 310 lb (140.6 kg), SpO2 94 %. Body mass index is 54.91 kg/m. Physical Exam  Constitutional: She is oriented to person, place, and time. She appears well-developed and well-nourished.  HENT:  Head: Normocephalic and atraumatic.  Nose: Nose normal.  Eyes: EOM are normal. No scleral icterus.  Neck: Normal range of motion. Neck supple. No thyromegaly present.  Cardiovascular: Normal rate and regular rhythm.  Murmur (heard best at left 2nd ICS) heard. Pulmonary/Chest: Effort normal. No respiratory distress.  Abdominal: Soft. There is no tenderness.  + Obesity  Musculoskeletal: Normal range of motion. She exhibits edema (trace edema noted in bilateral lower extremities).  Range of Motion normal in all 4 extremities  Neurological: She is alert and oriented to person, place, and time. Coordination normal.  Skin: Skin is warm and dry.  Psychiatric: She has a normal mood and affect.  Vitals reviewed.   RECENT LABS AND TESTS: BMET    Component Value Date/Time   NA 142 08/14/2018 1142   K 4.1 08/14/2018 1142   CL 104 08/14/2018 1142   CO2 25 08/14/2018 1142   GLUCOSE 85 08/14/2018 1142   GLUCOSE 104 (H) 06/20/2015 0830   BUN 10 08/14/2018 1142   CREATININE 0.68 08/14/2018 1142   CREATININE 0.59 12/19/2014 0806   CALCIUM 9.3 08/14/2018 1142   GFRNONAA 111 08/14/2018 1142   GFRAA 128 08/14/2018 1142   Lab Results  Component Value Date   HGBA1C  5.4 08/14/2018   Lab Results  Component Value Date   INSULIN 15.1 10/20/2018   CBC    Component Value Date/Time   WBC 5.4 10/20/2018 1041   WBC 9.4 06/27/2015 0612   RBC 4.52 10/20/2018 1041   RBC 4.16 06/27/2015 0612   HGB 12.2 10/20/2018 1041   HCT 38.7 10/20/2018  1041   PLT 229 10/20/2018 1041   MCV 86 10/20/2018 1041   MCH 27.0 10/20/2018 1041   MCH 24.3 (L) 06/27/2015 0612   MCHC 31.5 10/20/2018 1041   MCHC 29.5 (L) 06/27/2015 0612   RDW 13.8 10/20/2018 1041   LYMPHSABS 1.2 10/20/2018 1041   MONOABS 0.7 06/27/2015 0612   EOSABS 0.0 10/20/2018 1041   BASOSABS 0.0 10/20/2018 1041   Iron/TIBC/Ferritin/ %Sat    Component Value Date/Time   IRON 49 12/19/2014 0806   TIBC 402 12/19/2014 0806   IRONPCTSAT 12 (L) 12/19/2014 0806   Lipid Panel     Component Value Date/Time   CHOL 141 10/07/2017 1206   TRIG 58 10/07/2017 1206   HDL 61 10/07/2017 1206   CHOLHDL 2.3 10/07/2017 1206   CHOLHDL 2.7 12/19/2014 0806   VLDL 13 12/19/2014 0806   LDLCALC 68 10/07/2017 1206   Hepatic Function Panel     Component Value Date/Time   PROT 7.6 06/20/2015 0830   ALBUMIN 4.1 06/20/2015 0830   AST 22 06/20/2015 0830   ALT 39 06/20/2015 0830   ALKPHOS 68 06/20/2015 0830   BILITOT 0.5 06/20/2015 0830      Component Value Date/Time   TSH 1.320 10/20/2018 1041   TSH 1.870 10/07/2017 1206   TSH 1.526 12/19/2014 0921    ECG  shows NSR with a rate of 62 BPM INDIRECT CALORIMETER done today shows a VO2 of 237 and a REE of 1653.  Her calculated basal metabolic rate is 1610 thus her basal metabolic rate is worse than expected.    ASSESSMENT AND PLAN: Other fatigue - Plan: EKG 12-Lead, Insulin, random, Vitamin B12, VITAMIN D 25 Hydroxy (Vit-D Deficiency, Fractures), T3, T4, free, TSH  Shortness of breath on exertion  Essential hypertension - Plan: CBC with Differential/Platelet  Status post laparoscopic sleeve gastrectomy  Heart murmur  At risk for heart disease  Screening  for depression  Class 3 severe obesity with serious comorbidity and body mass index (BMI) of 50.0 to 59.9 in adult, unspecified obesity type (HCC)  PLAN: Fatigue Yaretsi was informed that her fatigue may be related to obesity, depression or many other causes. Labs will be ordered, and in the meanwhile Symantha has agreed to work on diet, exercise and weight loss to help with fatigue. Proper sleep hygiene was discussed including the need for 7-8 hours of quality sleep each night. A sleep study was not ordered based on symptoms and Epworth score.  Dyspnea on exertion Dwanda's shortness of breath appears to be obesity related and exercise induced. She has agreed to work on weight loss and gradually increase walking to treat her exercise induced shortness of breath. If Damilola follows our instructions and loses weight without improvement of her shortness of breath, we will plan to refer to pulmonology. We will monitor this condition regularly. Chondra agrees to this plan.  Hypertension We discussed sodium restriction, working on healthy weight loss, and a regular exercise program as the means to achieve improved blood pressure control. Rickita agreed with this plan and agreed to follow up as directed. We will continue to monitor her blood pressure as well as her progress with the above lifestyle modifications. We will not prescribe medications at this time. Kashara will watch for signs of hypotension as she continues her lifestyle modifications.  Cardiovascular risk counseling Maelie was given extended (15 minutes) coronary artery disease prevention counseling today. She is 37 y.o. female and has risk factors for heart disease including obesity and hypertension.  We discussed intensive lifestyle modifications today with an emphasis on specific weight loss instructions and strategies. Pt was also informed of the importance of increasing exercise and decreasing saturated fats to help prevent heart  disease.  Laparoscopic Gastric Sleeve (July 2016) Baily will start multi vitamins and we will check labs today. Allexa agrees to follow up with our clinic in 2 weeks.  Heart Murmur No testing is needed at this time. If fatigue continues with weight loss, we will consider testing.  Depression Screen Kween had a strongly positive depression screening. Depression is commonly associated with obesity and often results in emotional eating behaviors. We will monitor this closely and work on CBT to help improve the non-hunger eating patterns. Referral to Psychology may be required if no improvement is seen as she continues in our clinic.  Obesity Florentina is currently in the action stage of change and her goal is to continue with weight loss efforts. I recommend Ngozi begin the structured treatment plan as follows:  She has agreed to follow the Category 2 plan Modell has been instructed to eventually work up to a goal of 150 minutes of combined cardio and strengthening exercise per week for weight loss and overall health benefits. We discussed the following Behavioral Modification Strategies today: increase H2O intake, no skipping meals, increasing lean protein intake, decreasing simple carbohydrates , increasing vegetables, decrease eating out, work on increasing meal planning and easy cooking plans, holiday eating strategies  and celebration eating strategies   She was informed of the importance of frequent follow up visits to maximize her success with intensive lifestyle modifications for her multiple health conditions. She was informed we would discuss her lab results at her next visit unless there is a critical issue that needs to be addressed sooner. Tawana agreed to keep her next visit at the agreed upon time to discuss these results.    OBESITY BEHAVIORAL INTERVENTION VISIT  Today's visit was # 1   Starting weight: 310 lbs Starting date: 10/20/2018 Today's weight : 310 lbs   Today's date: 10/20/2018 Total lbs lost to date: 0   ASK: We discussed the diagnosis of obesity with Amber Daniels today and Mehgan agreed to give [place] permission to discuss obesity behavioral modification therapy today.  ASSESS: Keary has the diagnosis of obesity and her BMI today is 54.93 Jia is in the action stage of change   ADVISE: Anahla was educated on the multiple health risks of obesity as well as the benefit of weight loss to improve her health. She was advised of the need for long term treatment and the importance of lifestyle modifications to improve her current health and to decrease her risk of future health problems.  AGREE: Multiple dietary modification options and treatment options were discussed and  Caleb agreed to follow the recommendations documented in the above note.  ARRANGE: Ekta was educated on the importance of frequent visits to treat obesity as outlined per CMS and USPSTF guidelines and agreed to schedule her next follow up appointment today.  Cristi Loron, am acting as Energy manager for [employer]. Manson Passey, DO  I have reviewed the above documentation for accuracy and completeness, and I agree with the above. -Corinna Capra, DO

## 2018-10-25 NOTE — Telephone Encounter (Signed)
Sent pt a Wellsite geologistMyChart message. Jeralene PetersAshleigh Bulah Lurie, LPN

## 2018-11-03 ENCOUNTER — Encounter (INDEPENDENT_AMBULATORY_CARE_PROVIDER_SITE_OTHER): Payer: Self-pay

## 2018-11-03 ENCOUNTER — Ambulatory Visit (INDEPENDENT_AMBULATORY_CARE_PROVIDER_SITE_OTHER): Payer: BC Managed Care – PPO | Admitting: Bariatrics

## 2018-11-11 ENCOUNTER — Ambulatory Visit (INDEPENDENT_AMBULATORY_CARE_PROVIDER_SITE_OTHER): Payer: BC Managed Care – PPO | Admitting: Bariatrics

## 2018-11-11 ENCOUNTER — Encounter (INDEPENDENT_AMBULATORY_CARE_PROVIDER_SITE_OTHER): Payer: Self-pay | Admitting: Bariatrics

## 2018-11-11 VITALS — BP 133/81 | HR 74 | Temp 98.1°F | Ht 63.0 in | Wt 313.0 lb

## 2018-11-11 DIAGNOSIS — Z9189 Other specified personal risk factors, not elsewhere classified: Secondary | ICD-10-CM

## 2018-11-11 DIAGNOSIS — E559 Vitamin D deficiency, unspecified: Secondary | ICD-10-CM

## 2018-11-11 DIAGNOSIS — E88819 Insulin resistance, unspecified: Secondary | ICD-10-CM | POA: Insufficient documentation

## 2018-11-11 DIAGNOSIS — Z9884 Bariatric surgery status: Secondary | ICD-10-CM | POA: Diagnosis not present

## 2018-11-11 DIAGNOSIS — E8881 Metabolic syndrome: Secondary | ICD-10-CM

## 2018-11-11 DIAGNOSIS — Z6841 Body Mass Index (BMI) 40.0 and over, adult: Secondary | ICD-10-CM

## 2018-11-11 MED ORDER — VITAMIN D (ERGOCALCIFEROL) 1.25 MG (50000 UNIT) PO CAPS: 50000.0000 [IU] | ORAL_CAPSULE | ORAL | 0 refills | Status: DC

## 2018-11-11 NOTE — Progress Notes (Signed)
Office: [phone]  /  Fax: [phone]   HPI:   Chief Complaint: OBESITY Amber Daniels is here to discuss her progress with her obesity treatment plan. She is on the Category 2 plan and is following her eating plan approximately 75 to 80 % of the time. She states she is exercising 0 minutes 0 times per week. Amber Daniels did not struggle with the plan. She has skipped some meals, and she is not getting enough protein. Amber Daniels has not been hungry. Her weight is (!) 313 lb (142 kg) today and has had a weight gain of 3 pounds over a period of 3 weeks since her last visit. She has gained 3 lbs since starting treatment with [place].  Vitamin D deficiency Amber Daniels has a diagnosis of vitamin D deficiency. She is currently taking vit D and denies nausea, vomiting or muscle weakness.  Insulin Resistance Amber Daniels has a diagnosis of insulin resistance based on her elevated fasting insulin level of 15.1. Hgb A1c is at 5.4 Although Amber Daniels's blood glucose readings are still under good control, insulin resistance puts her at greater risk of metabolic syndrome and diabetes. She is not taking metformin currently and continues to work on diet and exercise to decrease risk of diabetes.  At risk for diabetes Amber Daniels is at higher than average risk for developing diabetes due to her obesity and insulin resistance. She currently denies polyuria or polydipsia.  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  ALLERGIES: Allergies  Allergen Reactions  . Other     Seasonal and enviromental allergies (dust)    MEDICATIONS: Current Outpatient Medications on File Prior to Visit  Medication Sig Dispense Refill  . acetaminophen (TYLENOL) 500 MG tablet Take 1,500 mg by mouth every 6 (six) hours as needed. For pain.    Amber Daniels (PROVENTIL HFA;VENTOLIN HFA) 108 (90 Base) MCG/ACT inhaler Inhale 2 puffs every 6 (six)  hours as needed into the lungs for wheezing or shortness of breath. 18 g 5  . beclomethasone (QVAR) 40 MCG/ACT inhaler Inhale 2 puffs into the lungs daily. 1 Inhaler 1  . cetirizine (ZYRTEC) 10 MG tablet Take 10 mg by mouth daily.    . famotidine (PEPCID) 20 MG tablet Take 1 tablet (20 mg total) 2 (two) times daily by mouth.    . fexofenadine (ALLEGRA) 180 MG tablet Take 180 mg by mouth daily.    . fluconazole (DIFLUCAN) 150 MG tablet Take 1 tablet (150 mg total) by mouth daily as needed (take at least 3 days between doses as needed for vaginal irritaiton). 10 tablet 0  . Fluocinolone Acetonide Scalp 0.01 % OIL Apply topically.    Amber Kitchen ketoconazole (NIZORAL) 2 % shampoo Apply 1 application topically 2 (two) times a week.    . metroNIDAZOLE (METROGEL) 0.75 % gel Apply 1 application topically 2 (two) times daily.    . montelukast (SINGULAIR) 10 MG tablet Take 1 tablet (10 mg total) at bedtime by mouth. 30 tablet 0  . omeprazole (PRILOSEC) 20 MG capsule Take 1 capsule (20 mg total) daily by mouth. 30 capsule 3  . pseudoephedrine (SUDAFED 12 HOUR) 120 MG 12 hr tablet Take 1 tablet (120 mg total) by mouth 2 (two) times daily. 30 tablet 3  . sucralfate (CARAFATE) 1 g tablet TAKE 1 TABLET (1 G TOTAL) 4 (FOUR) TIMES DAILY - WITH MEALS AND AT BEDTIME BY MOUTH. 240 tablet 0  . triamcinolone (NASACORT ALLERGY 24HR CHILDREN)  55 MCG/ACT AERO nasal inhaler Place 2 sprays into the nose daily.     No current facility-administered medications on file prior to visit.     PAST MEDICAL HISTORY: Past Medical History:  Diagnosis Date  . Allergy   . Anemia    during pregnancy   . Arthritis   . Asthma   . Depression   . Edema    lower extremities   . GERD (gastroesophageal reflux disease)   . Headache    occasional migraine   . Heart murmur   . Hypertension   . Morbid obesity (HCC)   . Osteoarthritis   . PONV (postoperative nausea and vomiting)   . Postpartum care following cesarean delivery (12/2)  11/01/2013  . Prediabetes   . Shortness of breath dyspnea    only asthma related   . Sleep apnea    not on CPAP, dx 01/2013 with Outpatietn overnight study at home.   . Swelling     PAST SURGICAL HISTORY: Past Surgical History:  Procedure Laterality Date  . BREATH TEK H PYLORI N/A 02/22/2015   Procedure: BREATH TEK H PYLORI;  Surgeon: Luretha Murphy, MD;  Location: [facility];  Service: General;  Laterality: N/A;  . CESAREAN SECTION    . CESAREAN SECTION N/A 11/01/2013   Procedure: REPEAT CESAREAN SECTION;  Surgeon: Serita Kyle, MD;  Location: [facility];  Service: Obstetrics;  Laterality: N/A;  EDD: 11/04/13  . LAPAROSCOPIC GASTRIC SLEEVE RESECTION N/A 06/25/2015   Procedure: LAPAROSCOPIC GASTRIC SLEEVE RESECTION;  Surgeon: Luretha Murphy, MD;  Location: [facility];  Service: General;  Laterality: N/A;  . WISDOM TOOTH EXTRACTION      SOCIAL HISTORY: Social History   Tobacco Use  . Smoking status: Never Smoker  . Smokeless tobacco: Never Used  Substance Use Topics  . Alcohol use: Yes    Alcohol/week: 7.0 standard drinks    Types: 7 Glasses of wine per week  . Drug use: No    FAMILY HISTORY: Family History  Problem Relation Age of Onset  . Diabetes Mother   . Hypertension Mother   . Depression Mother   . Anxiety disorder Mother   . Sleep apnea Mother   . Obesity Mother   . Diabetes Father   . Hypertension Father   . Hyperlipidemia Father   . Heart disease Father   . Anxiety disorder Father   . Obesity Father   . Hypertension Brother   . Hypertension Maternal Grandmother   . Hypertension Maternal Grandfather     ROS: Review of Systems  Constitutional: Negative for weight loss.  Gastrointestinal: Negative for nausea and vomiting.  Genitourinary: Negative for frequency.  Musculoskeletal:       Negative for muscle weakness  Endo/Heme/Allergies: Negative for polydipsia.    PHYSICAL EXAM: Blood pressure 133/81, pulse 74, temperature 98.1 F (36.7 C),  temperature source Oral, height 5\' 3"  (1.6 m), weight (!) 313 lb (142 kg), SpO2 100 %. Body mass index is 55.45 kg/m. Physical Exam Vitals signs reviewed.  Constitutional:      Appearance: Normal appearance. She is well-developed. She is obese.  Cardiovascular:     Rate and Rhythm: Normal rate.  Pulmonary:     Effort: Pulmonary effort is normal.  Musculoskeletal: Normal range of motion.  Skin:    General: Skin is warm and dry.  Neurological:     Mental Status: She is alert and oriented to person, place, and time.  Psychiatric:        Mood and  Affect: Mood normal.        Behavior: Behavior normal.     RECENT LABS AND TESTS: BMET    Component Value Date/Time   NA 142 08/14/2018 1142   K 4.1 08/14/2018 1142   CL 104 08/14/2018 1142   CO2 25 08/14/2018 1142   GLUCOSE 85 08/14/2018 1142   GLUCOSE 104 (H) 06/20/2015 0830   BUN 10 08/14/2018 1142   CREATININE 0.68 08/14/2018 1142   CREATININE 0.59 12/19/2014 0806   CALCIUM 9.3 08/14/2018 1142   GFRNONAA 111 08/14/2018 1142   GFRAA 128 08/14/2018 1142   Lab Results  Component Value Date   HGBA1C 5.4 08/14/2018   HGBA1C 5.4 10/07/2017   HGBA1C 5.8 (H) 12/19/2014   Lab Results  Component Value Date   INSULIN 15.1 10/20/2018   CBC    Component Value Date/Time   WBC 5.4 10/20/2018 1041   WBC 9.4 06/27/2015 0612   RBC 4.52 10/20/2018 1041   RBC 4.16 06/27/2015 0612   HGB 12.2 10/20/2018 1041   HCT 38.7 10/20/2018 1041   PLT 229 10/20/2018 1041   MCV 86 10/20/2018 1041   MCH 27.0 10/20/2018 1041   MCH 24.3 (L) 06/27/2015 0612   MCHC 31.5 10/20/2018 1041   MCHC 29.5 (L) 06/27/2015 0612   RDW 13.8 10/20/2018 1041   LYMPHSABS 1.2 10/20/2018 1041   MONOABS 0.7 06/27/2015 0612   EOSABS 0.0 10/20/2018 1041   BASOSABS 0.0 10/20/2018 1041   Iron/TIBC/Ferritin/ %Sat    Component Value Date/Time   IRON 49 12/19/2014 0806   TIBC 402 12/19/2014 0806   IRONPCTSAT 12 (L) 12/19/2014 0806   Lipid Panel     Component  Value Date/Time   CHOL 141 10/07/2017 1206   TRIG 58 10/07/2017 1206   HDL 61 10/07/2017 1206   CHOLHDL 2.3 10/07/2017 1206   CHOLHDL 2.7 12/19/2014 0806   VLDL 13 12/19/2014 0806   LDLCALC 68 10/07/2017 1206   Hepatic Function Panel     Component Value Date/Time   PROT 7.6 06/20/2015 0830   ALBUMIN 4.1 06/20/2015 0830   AST 22 06/20/2015 0830   ALT 39 06/20/2015 0830   ALKPHOS 68 06/20/2015 0830   BILITOT 0.5 06/20/2015 0830      Component Value Date/Time   TSH 1.320 10/20/2018 1041   TSH 1.870 10/07/2017 1206   TSH 1.526 12/19/2014 0921    Ref. Range 10/20/2018 10:41  Vitamin D, 25-Hydroxy Latest Ref Range: 30.0 - 100.0 ng/mL 9.2 (L)   ASSESSMENT AND PLAN: Vitamin D deficiency - Plan: Vitamin D, Ergocalciferol, (DRISDOL) 1.25 MG (50000 UT) CAPS capsule  Insulin resistance  History of gastric bypass  At risk for diabetes mellitus  Class 3 severe obesity with serious comorbidity and body mass index (BMI) of 50.0 to 59.9 in adult, unspecified obesity type (HCC)  At risk for osteoporosis  PLAN:  Vitamin D Deficiency Niana was informed that low vitamin D levels contributes to fatigue and are associated with obesity, breast, and colon cancer. She agrees to continue to take prescription Vit D @50 ,000 IU every week #4 with no refills and will follow up for routine testing of vitamin D, at least 2-3 times per year. She was informed of the risk of over-replacement of vitamin D and agrees to not increase her dose unless she discusses this with us first. [name] agrees to follow up as directed.  Insulin Resistance [name] will continue to work on weight loss, exercise, increasing lean protein and decreasing simple carbohydrates  in her diet to help decrease the risk of diabetes. She was informed that eating too many simple carbohydrates or too many calories at one sitting increases the likelihood of GI side effects. Kittie agreed to follow up with [place] as directed to monitor  her progress.  Diabetes risk counseling Thedora was given extended (15 minutes) diabetes prevention counseling today. She is 37 y.o. female and has risk factors for diabetes including obesity and insulin resistance. We discussed intensive lifestyle modifications today with an emphasis on weight loss as well as increasing exercise and decreasing simple carbohydrates in her diet.  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  Obesity Fusaye is currently in the action stage of change. As such, her goal is to continue with weight loss efforts She has agreed to follow the Category 2 plan Lakedra has been instructed to work up to a goal of 150 minutes of combined cardio and strengthening exercise per week for weight loss and overall health benefits. We discussed the following Behavioral Modification Strategies today: increase H2O intake, keeping healthy foods in the home, increasing lean protein intake, decreasing simple carbohydrates, increasing vegetables and work on meal planning and easy cooking plans  Treyana has agreed to follow up with our clinic in 2 weeks. She was informed of the importance of frequent follow up visits to maximize her success with intensive lifestyle modifications for her multiple health conditions.   OBESITY BEHAVIORAL INTERVENTION VISIT  Today's visit was # 2   Starting weight: 310 lbs Starting date: 10/20/2018 Today's weight : 313 lbs Today's date: 11/11/2018 Total lbs lost to date: 0    ASK: We discussed the diagnosis of obesity with Denice Bors today and [place]ne agreed to give [place] permission to discuss obesity behavioral modification therapy today.  ASSESS: Toleen has the diagnosis of obesity and her BMI today is 55.46 Christianna is in the action stage of change   ADVISE: Alixandria was educated on the multiple health risks of obesity as well as the  benefit of weight loss to improve her health. She was advised of the need for long term treatment and the importance of lifestyle modifications to improve her current health and to decrease her risk of future health problems.  AGREE: Multiple dietary modification options and treatment options were discussed and  Adiba agreed to follow the recommendations documented in the above note.  ARRANGE: Briunna was educated on the importance of frequent visits to treat obesity as outlined per CMS and USPSTF guidelines and agreed to schedule her next follow up appointment today.  Cristi Loron, am acting as Energy manager for [employer]. Manson Passey, DO  I have reviewed the above documentation for accuracy and completeness, and I agree with the above. -Corinna Capra, DO

## 2018-11-30 ENCOUNTER — Encounter (INDEPENDENT_AMBULATORY_CARE_PROVIDER_SITE_OTHER): Payer: Self-pay | Admitting: Bariatrics

## 2018-12-02 ENCOUNTER — Encounter (INDEPENDENT_AMBULATORY_CARE_PROVIDER_SITE_OTHER): Payer: Self-pay

## 2018-12-02 ENCOUNTER — Ambulatory Visit (INDEPENDENT_AMBULATORY_CARE_PROVIDER_SITE_OTHER): Payer: BC Managed Care – PPO | Admitting: Bariatrics

## 2018-12-22 ENCOUNTER — Ambulatory Visit: Payer: BC Managed Care – PPO | Admitting: Emergency Medicine

## 2019-04-26 ENCOUNTER — Ambulatory Visit: Payer: Self-pay | Admitting: *Deleted

## 2019-04-26 NOTE — Telephone Encounter (Signed)
Patient is experiencing swelling in her legs and tention headaches. She has an appt for a physical on 05/06/2019 but she wants to know should she come in now to address these problems and not wait for the appt in June.  5/12- patient found out dad had cancer- patient has been caring for him.Patient has been under stress and she has been going full throttle- she may need to be seen before her physical for evaluation of swelling.Call to office for appointment.  Reason for Disposition . [1] MODERATE leg swelling (e.g., swelling extends up to knees) AND [2] new onset or worsening  Answer Assessment - Initial Assessment Questions 1. ONSET: "When did the swelling start?" (e.g., minutes, hours, days)     2 weeks 2. LOCATION: "What part of the leg is swollen?"  "Are both legs swollen or just one leg?"     Left leg calf- foot- comes and goes at different degrees of swelling- no swelling on right 3. SEVERITY: "How bad is the swelling?" (e.g., localized; mild, moderate, severe)  - Localized - small area of swelling localized to one leg  - MILD pedal edema - swelling limited to foot and ankle, pitting edema < 1/4 inch (6 mm) deep, rest and elevation eliminate most or all swelling  - MODERATE edema - swelling of lower leg to knee, pitting edema > 1/4 inch (6 mm) deep, rest and elevation only partially reduce swelling  - SEVERE edema - swelling extends above knee, facial or hand swelling present      moderate 4. REDNESS: "Does the swelling look red or infected?"     no 5. PAIN: "Is the swelling painful to touch?" If so, ask: "How painful is it?"   (Scale 1-10; mild, moderate or severe)     A little pain- 5 6. FEVER: "Do you have a fever?" If so, ask: "What is it, how was it measured, and when did it start?"      No fever 7. CAUSE: "What do you think is causing the leg swelling?"     Retaining fluid- history of lasix and BP medication  8. MEDICAL HISTORY: "Do you have a history of heart failure, kidney  disease, liver failure, or cancer?"     No  9. RECURRENT SYMPTOM: "Have you had leg swelling before?" If so, ask: "When was the last time?" "What happened that time?"     In the past- patient took diuretic 2014 10. OTHER SYMPTOMS: "Do you have any other symptoms?" (e.g., chest pain, difficulty breathing)       Heart palpitations at times 11. PREGNANCY: "Is there any chance you are pregnant?" "When was your last menstrual period?"       IUD- no  Protocols used: LEG SWELLING AND EDEMA-A-AH

## 2019-04-27 ENCOUNTER — Encounter: Payer: Self-pay | Admitting: Family Medicine

## 2019-04-27 ENCOUNTER — Ambulatory Visit (INDEPENDENT_AMBULATORY_CARE_PROVIDER_SITE_OTHER): Payer: BC Managed Care – PPO | Admitting: Family Medicine

## 2019-04-27 ENCOUNTER — Other Ambulatory Visit: Payer: Self-pay

## 2019-04-27 VITALS — BP 124/83 | HR 70 | Temp 98.9°F | Resp 18 | Ht 63.0 in | Wt 323.4 lb

## 2019-04-27 DIAGNOSIS — J4520 Mild intermittent asthma, uncomplicated: Secondary | ICD-10-CM

## 2019-04-27 DIAGNOSIS — F329 Major depressive disorder, single episode, unspecified: Secondary | ICD-10-CM

## 2019-04-27 DIAGNOSIS — F32A Depression, unspecified: Secondary | ICD-10-CM | POA: Insufficient documentation

## 2019-04-27 DIAGNOSIS — T7840XA Allergy, unspecified, initial encounter: Secondary | ICD-10-CM | POA: Insufficient documentation

## 2019-04-27 DIAGNOSIS — R609 Edema, unspecified: Secondary | ICD-10-CM | POA: Diagnosis not present

## 2019-04-27 DIAGNOSIS — T7840XS Allergy, unspecified, sequela: Secondary | ICD-10-CM

## 2019-04-27 MED ORDER — FUROSEMIDE 20 MG PO TABS: 20.0000 mg | ORAL_TABLET | Freq: Every day | ORAL | 0 refills | Status: DC

## 2019-04-27 MED ORDER — CITALOPRAM HYDROBROMIDE 20 MG PO TABS: 20.0000 mg | ORAL_TABLET | Freq: Every day | ORAL | 3 refills | Status: DC

## 2019-04-27 NOTE — Progress Notes (Signed)
Acute Office Visit  Subjective:    Patient ID: Amber Daniels, female    DOB: 07/22/1981, 38 y.o.   MRN: [id number]  Chief Complaint  Patient presents with  . Headache    right side starts in front over eye and radiates to back of head off and on recently worse past couple weeks  . Leg Pain    left leg pain feels tight and tingles and foot is swollen x2weeks   . Depression    score was a 21  . Sore Throat    woke up this morning with a sore throat but does have allergies.     HPI Patient is in today for right sided headache with radiation to the back of her head. Increase stress. Dad diagnosed with lung CA. Pt with s/t especially in the morning. Pt with no fever. Increase fatigue pt uses albuterol and qvar for asthma-currently in good control. Pt with zyrtec , and allegra alternating for allergy symptoms. Pt uses singulair for allergy and astham and started sudafed for decongestant.  Pt uses nasacort for allegies.  Pt with eye drops given by allergist.   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 Pt has also stayed with her father during the diagnosis for lung cancer. Pt states she does not know the stage but is assisting her parents-increase in anxiety and depression due to diagnosis-father was a smoker  Past Medical History:  Diagnosis Date  . Allergy   . Anemia    during pregnancy   . Arthritis   . Asthma   . Depression   . Edema    lower extremities   . GERD (gastroesophageal reflux disease)   . Headache    occasional migraine   . Heart murmur   . Hypertension   . Morbid obesity (HCC)   . Osteoarthritis   . PONV (postoperative nausea and vomiting)   . Postpartum care following cesarean delivery (12/2) 11/01/2013  . Prediabetes   . Shortness of breath dyspnea    only asthma related   . Sleep apnea    not on CPAP, dx 01/2013 with Outpatietn overnight study at home.   . Swelling     Past Surgical History:  Procedure Laterality Date  .  BREATH TEK H PYLORI N/A 02/22/2015   Procedure: BREATH TEK H PYLORI;  Surgeon: Luretha MurphyMatthew Martin, MD;  [facility];  Service: General;  Laterality: N/A;  . CESAREAN SECTION    . CESAREAN SECTION N/A 11/01/2013   Procedure: REPEAT CESAREAN SECTION;  Surgeon: Serita KyleSheronette A Cousins, MD;  Location: [facility];  Service: Obstetrics;  Laterality: N/A;  EDD: 11/04/13  . LAPAROSCOPIC GASTRIC SLEEVE RESECTION N/A 06/25/2015   Procedure: LAPAROSCOPIC GASTRIC SLEEVE RESECTION;  Surgeon: Luretha MurphyMatthew Martin, MD;  Location: [facility];  Service: General;  Laterality: N/A;  . WISDOM TOOTH EXTRACTION      Family History  Problem Relation Age of Onset  . Diabetes Mother   . Hypertension Mother   . Depression Mother   . Anxiety disorder Mother   . Sleep apnea Mother   . Obesity Mother   . Diabetes Father   . Hypertension Father   . Hyperlipidemia Father   . Heart disease Father   . Anxiety disorder Father   . Obesity Father   . Hypertension Brother   . Hypertension Maternal Grandmother   . Hypertension Maternal Grandfather     Social History   Socioeconomic History  . Marital status:  Single    Spouse name: Not on file  . Number of children: Not on file  . Years of education: Not on file  . Highest education level: Not on file  Occupational History  . 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 Financial resource strain: Not on file  . Food insecurity:    Worry: Not on file    Inability: Not on file  . Transportation needs:    Medical: Not on file    Non-medical: Not on file  Tobacco Use  . Smoking status: Never Smoker  . Smokeless tobacco: Never Used  Substance and Sexual Activity  . Alcohol use: Yes    Alcohol/week: 7.0 standard drinks    Types: 7 Glasses of wine per week  . Drug use: No  . Sexual activity: Yes    Birth control/protection: I.U.D.  Lifestyle  . Physical activity:    Days per week: Not on file    Minutes per session: Not on file  . Stress: Not on file   Relationships  . Social connections:    Talks on phone: Not on file    Gets together: Not on file    Attends religious service: Not on file    Active member of club or organization: Not on file    Attends meetings of clubs or organizations: Not on file    Relationship status: Not on file  . Intimate partner violence:    Fear of current or ex partner: Not on file    Emotionally abused: Not on file    Physically abused: Not on file    Forced sexual activity: Not on file  Other Topics Concern  . Not on file  Social History Narrative   Caffeine none.  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      Outpatient Medications Prior to Visit  Medication Sig Dispense Refill  . acetaminophen (TYLENOL) 500 MG tablet Take 1,500 mg by mouth every 6 (six) hours as needed. For pain.    Marland Kitchen albuterol (PROVENTIL HFA;VENTOLIN HFA) 108 (90 Base) MCG/ACT inhaler Inhale 2 puffs every 6 (six) hours as needed into the lungs for wheezing or shortness of breath. 18 g 5  . beclomethasone (QVAR) 40 MCG/ACT inhaler Inhale 2 puffs into the lungs daily. 1 Inhaler 1  . cetirizine (ZYRTEC) 10 MG tablet Take 10 mg by mouth daily.    . famotidine (PEPCID) 20 MG tablet Take 1 tablet (20 mg total) 2 (two) times daily by mouth.    . fexofenadine (ALLEGRA) 180 MG tablet Take 180 mg by mouth daily.    . fluconazole (DIFLUCAN) 150 MG tablet Take 1 tablet (150 mg total) by mouth daily as needed (take at least 3 days between doses as needed for vaginal irritaiton). 10 tablet 0  . Fluocinolone Acetonide Scalp 0.01 % OIL Apply topically.    Marland Kitchen ketoconazole (NIZORAL) 2 % shampoo Apply 1 application topically 2 (two) times a week.    . metroNIDAZOLE (METROGEL) 0.75 % gel Apply 1 application topically 2 (two) times daily.    . montelukast (SINGULAIR) 10 MG tablet Take 1 tablet (10 mg total) at bedtime by mouth. 30 tablet 0  . omeprazole (PRILOSEC) 20 MG capsule Take 1 capsule (20 mg total) daily by mouth. 30 capsule 3  .  pseudoephedrine (SUDAFED 12 HOUR) 120 MG 12 hr tablet Take 1 tablet (120 mg total) by mouth 2 (two) times daily. 30 tablet 3  . triamcinolone (NASACORT ALLERGY 24HR CHILDREN)  55 MCG/ACT AERO nasal inhaler Place 2 sprays into the nose daily.    . Vitamin D, Ergocalciferol, (DRISDOL) 1.25 MG (50000 UT) CAPS capsule Take 1 capsule (50,000 Units total) by mouth every 7 (seven) days. 4 capsule 0  . sucralfate (CARAFATE) 1 g tablet TAKE 1 TABLET (1 G TOTAL) 4 (FOUR) TIMES DAILY - WITH MEALS AND AT BEDTIME BY MOUTH. 240 tablet 0   No facility-administered medications prior to visit.     Allergies  Allergen Reactions  . Other     Seasonal and enviromental allergies (dust)    Review of Systems  Constitutional: Positive for malaise/fatigue. Negative for chills and fever.  HENT: Positive for congestion, sinus pain and sore throat. Negative for ear pain.   Respiratory: Negative for cough and sputum production.   Cardiovascular: Positive for chest pain and leg swelling.  Gastrointestinal: Positive for nausea. Negative for vomiting.  Genitourinary: Negative for dysuria.  Psychiatric/Behavioral: Positive for depression.       Objective:    Physical Exam  Constitutional: She appears well-developed and well-nourished. No distress.  HENT:  Right Ear: External ear normal.  Left Ear: External ear normal.  Nose: Nose normal.  Mouth/Throat: Oropharynx is clear and moist.  Eyes: Conjunctivae are normal.  Cardiovascular: Normal rate and regular rhythm.  Pulmonary/Chest: Effort normal and breath sounds normal.  Musculoskeletal:        General: Edema present.  Skin: No rash noted.   bilat LE -non pitting edema BP 124/83   Pulse 70   Temp 98.9 F (37.2 C) (Oral)   Resp 18   Ht  (1.6 m)   Wt (!) 323 lb 6.4 oz (146.7 kg)   SpO2 98%   BMI 57.29 kg/m  Wt Readings from Last 3 Encounters:  04/27/19 (!) 323 lb 6.4 oz (146.7 kg)  11/11/18 (!) 313 lb (142 kg)  10/20/18 (!) 310 lb (140.6 kg)     Lab Results  Component Value Date   TSH 1.320 10/20/2018   Lab Results  Component Value Date   WBC 5.4 10/20/2018   HGB 12.2 10/20/2018   HCT 38.7 10/20/2018   MCV 86 10/20/2018   PLT 229 10/20/2018   Lab Results  Component Value Date   NA 142 08/14/2018   K 4.1 08/14/2018   CO2 25 08/14/2018   GLUCOSE 85 08/14/2018   BUN 10 08/14/2018   CREATININE 0.68 08/14/2018   BILITOT 0.5 06/20/2015   ALKPHOS 68 06/20/2015   AST 22 06/20/2015   ALT 39 06/20/2015   PROT 7.6 06/20/2015   ALBUMIN 4.1 06/20/2015   CALCIUM 9.3 08/14/2018   ANIONGAP 5 06/20/2015   Lab Results  Component Value Date   CHOL 141 10/07/2017   Lab Results  Component Value Date   HDL 61 10/07/2017   Lab Results  Component Value Date   LDLCALC 68 10/07/2017   Lab Results  Component Value Date   TRIG 58 10/07/2017   Lab Results  Component Value Date   CHOLHDL 2.3 10/07/2017   Lab Results  Component Value Date   HGBA1C 5.4 08/14/2018       Assessment & Plan:  1. 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 Mild intermittent asthma without complication  continue qvar, albuterol , singular 3. Allergic state, sequela Consider change to xyzal to replace zyrtec or allegra  4. LE edema-take lasix daily, elevate legs, f/u for recheck lytes and to see if improvement with more movement after out ot school, improvement with lasix and elevation. Avoid salt in diet LISA Mat Carne, MD

## 2019-04-27 NOTE — Patient Instructions (Addendum)
    Consider trying xyzal daily instead of allegra or zyrtec for allergies Continue all other allergy medications as discussed Schedule for 3-4 weeks for recheck If you have lab work done today you will be contacted with your lab results within the next 2 weeks.  If you have not heard from [place] then please contact us. The fastest way to get your results is to register for My Chart.   IF you received an x-ray today, you will receive an invoice from [facility]. Please contact [facility] at [phone] with questions or concerns regarding your invoice.   IF you received labwork today, you will receive an invoice from [place]. Please contact [facility] at [phone] with questions or concerns regarding your invoice.   Our billing staff will not be able to assist you with questions regarding bills from these companies.  You will be contacted with the lab results as soon as they are available. The fastest way to get your results is to activate your My Chart account. Instructions are located on the last page of this paperwork. If you have not heard from [place] regarding the results in 2 weeks, please contact this office.

## 2019-05-02 ENCOUNTER — Encounter: Payer: BC Managed Care – PPO | Admitting: Family Medicine

## 2019-05-06 ENCOUNTER — Other Ambulatory Visit: Payer: Self-pay

## 2019-05-06 ENCOUNTER — Encounter: Payer: Self-pay | Admitting: Family Medicine

## 2019-05-06 ENCOUNTER — Ambulatory Visit (INDEPENDENT_AMBULATORY_CARE_PROVIDER_SITE_OTHER): Payer: BC Managed Care – PPO | Admitting: Family Medicine

## 2019-05-06 ENCOUNTER — Other Ambulatory Visit (HOSPITAL_COMMUNITY)
Admission: RE | Admit: 2019-05-06 | Discharge: 2019-05-06 | Disposition: A | Discharge status: Home / Self Care | Payer: BC Managed Care – PPO | Source: Ambulatory Visit | Attending: Family Medicine | Admitting: Family Medicine

## 2019-05-06 VITALS — BP 120/76 | HR 96 | Temp 98.7°F | Resp 18 | Ht 63.58 in | Wt 322.6 lb

## 2019-05-06 DIAGNOSIS — J4530 Mild persistent asthma, uncomplicated: Secondary | ICD-10-CM

## 2019-05-06 DIAGNOSIS — Z0001 Encounter for general adult medical examination with abnormal findings: Secondary | ICD-10-CM | POA: Diagnosis not present

## 2019-05-06 DIAGNOSIS — Z0000 Encounter for general adult medical examination without abnormal findings: Secondary | ICD-10-CM

## 2019-05-06 DIAGNOSIS — Z1389 Encounter for screening for other disorder: Secondary | ICD-10-CM

## 2019-05-06 DIAGNOSIS — E559 Vitamin D deficiency, unspecified: Secondary | ICD-10-CM

## 2019-05-06 DIAGNOSIS — E8881 Metabolic syndrome: Secondary | ICD-10-CM

## 2019-05-06 DIAGNOSIS — Z113 Encounter for screening for infections with a predominantly sexual mode of transmission: Secondary | ICD-10-CM

## 2019-05-06 DIAGNOSIS — Z13228 Encounter for screening for other metabolic disorders: Secondary | ICD-10-CM

## 2019-05-06 DIAGNOSIS — R635 Abnormal weight gain: Secondary | ICD-10-CM | POA: Diagnosis not present

## 2019-05-06 DIAGNOSIS — Z130 Encounter for screening for diseases of the blood and blood-forming organs and certain disorders involving the immune mechanism: Secondary | ICD-10-CM | POA: Diagnosis not present

## 2019-05-06 DIAGNOSIS — Z13220 Encounter for screening for lipoid disorders: Secondary | ICD-10-CM

## 2019-05-06 DIAGNOSIS — J301 Allergic rhinitis due to pollen: Secondary | ICD-10-CM | POA: Diagnosis not present

## 2019-05-06 LAB — POCT URINALYSIS DIP (MANUAL ENTRY)
Bilirubin, UA: NEGATIVE
Blood, UA: NEGATIVE
Glucose, UA: NEGATIVE mg/dL
Ketones, POC UA: NEGATIVE mg/dL
Leukocytes, UA: NEGATIVE
Nitrite, UA: NEGATIVE
Protein Ur, POC: NEGATIVE mg/dL
Spec Grav, UA: 1.015 (ref 1.010–1.025)
Urobilinogen, UA: 0.2 E.U./dL
pH, UA: 6.5 (ref 5.0–8.0)

## 2019-05-06 MED ORDER — BECLOMETHASONE DIPROPIONATE 40 MCG/ACT IN AERS: 2.0000 | INHALATION_SPRAY | Freq: Every day | RESPIRATORY_TRACT | 1 refills | Status: DC

## 2019-05-06 MED ORDER — MONTELUKAST SODIUM 10 MG PO TABS: 10.0000 mg | ORAL_TABLET | Freq: Every day | ORAL | 1 refills | Status: AC

## 2019-05-06 MED ORDER — KETOCONAZOLE 2 % EX SHAM: 1.0000 "application " | MEDICATED_SHAMPOO | CUTANEOUS | 1 refills | Status: AC

## 2019-05-06 MED ORDER — ALBUTEROL SULFATE HFA 108 (90 BASE) MCG/ACT IN AERS: 2.0000 | INHALATION_SPRAY | Freq: Four times a day (QID) | RESPIRATORY_TRACT | 5 refills | Status: DC | PRN

## 2019-05-06 MED ORDER — FLUOCINOLONE ACETONIDE SCALP 0.01 % EX OIL: TOPICAL_OIL | CUTANEOUS | 1 refills | Status: AC

## 2019-05-06 MED ORDER — VITAMIN D (ERGOCALCIFEROL) 1.25 MG (50000 UNIT) PO CAPS: 50000.0000 [IU] | ORAL_CAPSULE | ORAL | 3 refills | Status: DC

## 2019-05-06 NOTE — Patient Instructions (Signed)
° ° ° °  If you have lab work done today you will be contacted with your lab results within the next 2 weeks.  If you have not heard from us then please contact us. The fastest way to get your results is to register for My Chart. ° ° °IF you received an x-ray today, you will receive an invoice from [facility]. Please contact [facility] at [phone] with questions or concerns regarding your invoice.  ° °IF you received labwork today, you will receive an invoice from [facility]. Please contact [facility] at [phone] with questions or concerns regarding your invoice.  ° °Our billing staff will not be able to assist you with questions regarding bills from these companies. ° °You will be contacted with the lab results as soon as they are available. The fastest way to get your results is to activate your My Chart account. Instructions are located on the last page of this paperwork. If you have not heard from us regarding the results in 2 weeks, please contact this office. °  ° ° ° °

## 2019-05-06 NOTE — Progress Notes (Signed)
Chief Complaint  Patient presents with  . Annual Exam  . Medication Refill    pending  . Obesity  . Depression    screening done 16  . Anxiety    screening 17    Subjective:  Amber Daniels is a 38 y.o. female here for a health maintenance visit.  Patient is established pt   Adjustment disorder She was stared on Celexa when her PHQ was 67 May 2020 She states that she feels like it is not really depression as much as the severe stress. She states that she is transitioning to do that.  Depression screen [facility] 2/9 05/06/2019 04/27/2019 10/20/2018 04/30/2018 02/03/2018  Decreased Interest '2 3 3 '$ 0 0  Down, Depressed, Hopeless '2 3 3 '$ 0 0  PHQ - 2 Score '4 6 6 '$ 0 0  Altered sleeping '3 3 1 '$ - -  Tired, decreased energy '2 3 2 '$ - -  Change in appetite '3 3 2 '$ - -  Feeling bad or failure about yourself  0 0 3 - -  Trouble concentrating '3 3 3 '$ - -  Moving slowly or fidgety/restless '1 3 1 '$ - -  Suicidal thoughts 0 0 0 - -  PHQ-9 Score '16 21 18 '$ - -  Difficult doing work/chores Somewhat difficult - Somewhat difficult - -    She is working She does not exercise  Wt Readings from Last 3 Encounters:  04/27/19 (!) 323 lb 6.4 oz (146.7 kg)  11/11/18 (!) 313 lb (142 kg)  10/20/18 (!) 310 lb (140.6 kg)     Patient Active Problem List   Diagnosis Date Noted  . Mild intermittent asthma without complication 25/95/6387  . Depression 04/27/2019  . Allergies 04/27/2019  . Edema 04/27/2019  . Insulin resistance 11/11/2018  . Heart murmur 10/25/2018  . 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 Obesity hypoventilation syndrome ([place]) 12/18/2014  . Snoring 12/18/2014  . Asthma with acute exacerbation 12/18/2014  . Morbid obesity ([place]) 11/17/2014  . HTN (hypertension) 11/17/2014  . Esophageal reflux 11/17/2014  . Bilateral edema of lower extremity 11/17/2014    Past Medical History:  Diagnosis Date  . Allergy   . Anemia    during pregnancy   . Arthritis   . Asthma   .  Depression   . Edema    lower extremities   . GERD (gastroesophageal reflux disease)   . Headache    occasional migraine   . Heart murmur   . Hypertension   . Morbid obesity ([place])   . Osteoarthritis   . PONV (postoperative nausea and vomiting)   . Postpartum care following cesarean delivery (12/2) 11/01/2013  . Prediabetes   . Shortness of breath dyspnea    only asthma related   . Sleep apnea    not on CPAP, dx 01/2013 with Outpatietn overnight study at home.   . Swelling     Past Surgical History:  Procedure Laterality Date  . BREATH TEK H PYLORI N/A 02/22/2015   Procedure: BREATH TEK H PYLORI;  Surgeon: Johnathan Hausen, MD;  Location: [facility];  Service: General;  Laterality: N/A;  . CESAREAN SECTION    . CESAREAN SECTION N/A 11/01/2013   Procedure: REPEAT CESAREAN SECTION;  Surgeon: Marvene Staff, MD;  Location: [facility];  Service: Obstetrics;  Laterality: N/A;  EDD: 11/04/13  . LAPAROSCOPIC GASTRIC SLEEVE RESECTION N/A 06/25/2015   Procedure: LAPAROSCOPIC GASTRIC SLEEVE RESECTION;  Surgeon: Johnathan Hausen, MD;  Location: [facility];  Service: General;  Laterality:  N/A;  . WISDOM TOOTH EXTRACTION       Outpatient Medications Prior to Visit  Medication Sig Dispense Refill  . acetaminophen (TYLENOL) 500 MG tablet Take 1,500 mg by mouth every 6 (six) hours as needed. For pain.    . cetirizine (ZYRTEC) 10 MG tablet Take 10 mg by mouth daily.    . citalopram (CELEXA) 20 MG tablet Take 1 tablet (20 mg total) by mouth daily. 30 tablet 3  . famotidine (PEPCID) 20 MG tablet Take 1 tablet (20 mg total) 2 (two) times daily by mouth.    . fexofenadine (ALLEGRA) 180 MG tablet Take 180 mg by mouth daily.    . fluconazole (DIFLUCAN) 150 MG tablet Take 1 tablet (150 mg total) by mouth daily as needed (take at least 3 days between doses as needed for vaginal irritaiton). 10 tablet 0  . furosemide (LASIX) 20 MG tablet Take 1 tablet (20 mg total) by mouth daily. 30 tablet 0  . metroNIDAZOLE  (METROGEL) 0.75 % gel Apply 1 application topically 2 (two) times daily.    Marland Kitchen omeprazole (PRILOSEC) 20 MG capsule Take 1 capsule (20 mg total) daily by mouth. 30 capsule 3  . pseudoephedrine (SUDAFED 12 HOUR) 120 MG 12 hr tablet Take 1 tablet (120 mg total) by mouth 2 (two) times daily. 30 tablet 3  . triamcinolone (NASACORT ALLERGY 24HR CHILDREN) 55 MCG/ACT AERO nasal inhaler Place 2 sprays into the nose daily.    . Vitamin D, Ergocalciferol, (DRISDOL) 1.25 MG (50000 UT) CAPS capsule Take 1 capsule (50,000 Units total) by mouth every 7 (seven) days. 4 capsule 0  . albuterol (PROVENTIL HFA;VENTOLIN HFA) 108 (90 Base) MCG/ACT inhaler Inhale 2 puffs every 6 (six) hours as needed into the lungs for wheezing or shortness of breath. 18 g 5  . beclomethasone (QVAR) 40 MCG/ACT inhaler Inhale 2 puffs into the lungs daily. 1 Inhaler 1  . Fluocinolone Acetonide Scalp 0.01 % OIL Apply topically.    Marland Kitchen ketoconazole (NIZORAL) 2 % shampoo Apply 1 application topically 2 (two) times a week.    . montelukast (SINGULAIR) 10 MG tablet Take 1 tablet (10 mg total) at bedtime by mouth. 30 tablet 0  . sucralfate (CARAFATE) 1 g tablet TAKE 1 TABLET (1 G TOTAL) 4 (FOUR) TIMES DAILY - WITH MEALS AND AT BEDTIME BY MOUTH. 240 tablet 0   No facility-administered medications prior to visit.     Allergies  Allergen Reactions  . Other     Seasonal and enviromental allergies (dust)     Family History  Problem Relation Age of Onset  . Diabetes Mother   . Hypertension Mother   . Depression Mother   . Anxiety disorder Mother   . Sleep apnea Mother   . Obesity Mother   . Diabetes Father   . Hypertension Father   . Hyperlipidemia Father   . Heart disease Father        cardiomegaly, CHF  . Anxiety disorder Father   . Obesity Father   . Cancer Father 59       lung cancer  . Mental illness Father   . Hypertension Brother   . Diabetes Brother   . Hypertension Maternal Grandmother   . Hypertension Maternal  Grandfather   . Hypertension Sister      Health Habits: Dental Exam: up to date Eye Exam: up to date Exercise: 0 times/week on average Current exercise activities: walking/running Diet:   Social History   Socioeconomic History  . Marital  status: Single    Spouse name: Not on file  . Number of children: 2  . Years of education: Not on file  . Highest education level: Not on file  Occupational History  . 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 Financial resource strain: Not on file  . Food insecurity:    Worry: Not on file    Inability: Not on file  . Transportation needs:    Medical: Not on file    Non-medical: Not on file  Tobacco Use  . Smoking status: Never Smoker  . Smokeless tobacco: Never Used  Substance and Sexual Activity  . Alcohol use: Yes    Alcohol/week: 7.0 standard drinks    Types: 7 Glasses of wine per week  . Drug use: No  . Sexual activity: Yes    Birth control/protection: I.U.D.  Lifestyle  . Physical activity:    Days per week: Not on file    Minutes per session: Not on file  . Stress: Not on file  Relationships  . Social connections:    Talks on phone: Not on file    Gets together: Not on file    Attends religious service: Not on file    Active member of club or organization: Not on file    Attends meetings of clubs or organizations: Not on file    Relationship status: Not on file  . Intimate partner violence:    Fear of current or ex partner: Not on file    Emotionally abused: Not on file    Physically abused: Not on file    Forced sexual activity: Not on file  Other Topics Concern  . Not on file  Social History Narrative   Caffeine none.  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     Social History   Substance and Sexual Activity  Alcohol Use Yes  . Alcohol/week: 7.0 standard drinks  . Types: 7 Glasses of wine per week   Social History   Tobacco Use  Smoking Status Never Smoker  Smokeless Tobacco Never Used    Social History   Substance and Sexual Activity  Drug Use No    GYN: Sexual Health Menstrual status: regular menses LMP: No LMP recorded. (Menstrual status: IUD). Last pap smear: see HM section History of abnormal pap smears:  Sexually active: ** with ** partner Current contraception:   Health Maintenance: See under health Maintenance activity for review of completion dates as well. Immunization History  Administered Date(s) Administered  . Influenza,inj,Quad PF,6+ Mos 10/20/2014  . Influenza-Unspecified 08/02/2013  . Pneumococcal Polysaccharide-23 11/04/2013  . Tdap 08/19/2013      Depression Screen-PHQ2/9 Depression screen [facility] 2/9 05/06/2019 04/27/2019 10/20/2018 04/30/2018 02/03/2018  Decreased Interest '2 3 3 '$ 0 0  Down, Depressed, Hopeless '2 3 3 '$ 0 0  PHQ - 2 Score '4 6 6 '$ 0 0  Altered sleeping '3 3 1 '$ - -  Tired, decreased energy '2 3 2 '$ - -  Change in appetite '3 3 2 '$ - -  Feeling bad or failure about yourself  0 0 3 - -  Trouble concentrating '3 3 3 '$ - -  Moving slowly or fidgety/restless '1 3 1 '$ - -  Suicidal thoughts 0 0 0 - -  PHQ-9 Score '16 21 18 '$ - -  Difficult doing work/chores Somewhat difficult - Somewhat difficult - -       Depression Severity and Treatment Recommendations:  0-4= None  5-9= Mild / Treatment: Support, educate to call  if worse; return in one month  10-14= Moderate / Treatment: Support, watchful waiting; Antidepressant or Psycotherapy  15-19= Moderately severe / Treatment: Antidepressant OR Psychotherapy  >= 20 = Major depression, severe / Antidepressant AND Psychotherapy    Review of Systems   ROS  See HPI for ROS as well.   Review of Systems  Constitutional: Negative for activity change, appetite change, chills and fever.  HENT: Negative for congestion, nosebleeds, trouble swallowing and voice change.   Respiratory: Negative for cough, shortness of breath and wheezing.   Gastrointestinal: Negative for diarrhea, nausea and vomiting.   Genitourinary: Negative for difficulty urinating, dysuria, flank pain and hematuria.  Musculoskeletal: Negative for back pain, joint swelling and neck pain.  Neurological: Negative for dizziness, speech difficulty, light-headedness and numbness.  See HPI. All other review of systems negative.    Objective:   Vitals:   05/06/19 1101  Pulse: 96  Temp: 98.7 F (37.1 C)  TempSrc: Oral  SpO2: 98%    There is no height or weight on file to calculate BMI.  Physical Exam Constitutional:      Appearance: Normal appearance.     Comments: Severely obese  HENT:     Head: Normocephalic and atraumatic.     Right Ear: Tympanic membrane normal.     Left Ear: Tympanic membrane normal.     Nose: Nose normal.  Eyes:     Conjunctiva/sclera: Conjunctivae normal.     Pupils: Pupils are equal, round, and reactive to light.  Cardiovascular:     Rate and Rhythm: Normal rate and regular rhythm.     Pulses: Normal pulses.  Pulmonary:     Effort: Pulmonary effort is normal. No respiratory distress.     Breath sounds: Normal breath sounds. No wheezing.  Abdominal:     General: Abdomen is flat. There is no distension.     Palpations: Abdomen is soft. There is no mass.     Tenderness: There is no abdominal tenderness. There is no guarding or rebound.     Hernia: No hernia is present.  Skin:    Capillary Refill: Capillary refill takes less than 2 seconds.  Neurological:     General: No focal deficit present.     Mental Status: She is alert and oriented to person, place, and time.  Psychiatric:        Mood and Affect: Mood normal.        Behavior: Behavior normal.        Thought Content: Thought content normal.        Judgment: Judgment normal.        Assessment/Plan:   Patient was seen for a health maintenance exam.  Counseled the patient on health maintenance issues. Reviewed her health mainteance schedule and ordered appropriate tests (see orders.) Counseled on regular exercise and  weight management. Recommend regular eye exams and dental cleaning.   The following issues were addressed today for health maintenance:   Neeti was seen today for annual exam, medication refill, obesity, depression and anxiety.  Diagnoses and all orders for this visit:  Encounter for health maintenance examination in adult  Seasonal allergic rhinitis due to pollen -     montelukast (SINGULAIR) 10 MG tablet; Take 1 tablet (10 mg total) by mouth at bedtime.  Screening for deficiency anemia  Screening for cholesterol level -     Lipid panel  Screening for metabolic disorder -     NMM76+KGSU  Screening for hematuria or proteinuria -  POCT urinalysis dipstick  Mild persistent asthma without complication -     beclomethasone (QVAR) 40 MCG/ACT inhaler; Inhale 2 puffs into the lungs daily.  Insulin resistance -     CMP14+EGFR -     Hemoglobin A1c  Vitamin D deficiency -     VITAMIN D 25 Hydroxy (Vit-D Deficiency, Fractures)  Weight gain -     Hemoglobin A1c  Screen for STD (sexually transmitted disease) -     GC/Chlamydia probe amp ([place])not at [facility] -     Hepatitis B surface antigen -     HIV Antibody (routine testing w rflx) -     RPR  Other orders -     albuterol (VENTOLIN HFA) 108 (90 Base) MCG/ACT inhaler; Inhale 2 puffs into the lungs every 6 (six) hours as needed for wheezing or shortness of breath. -     Fluocinolone Acetonide Scalp 0.01 % OIL; Apply topically to affected areas -     ketoconazole (NIZORAL) 2 % shampoo; Apply 1 application topically 2 (two) times a week.    Medical Decision Making Health Maintenance - advised exercise, eye exam, dental visit and continued Gyne care with Dr. Garwin Brothers  Morbid obesity Advised pt to follow up with [facility] due to her increased weight gain and risk factors for diabetes and heart disease. She has family history of heart failure and she is currently having worsening of her asthma.  It is  imperative that she lose weight Despite her gastric sleeve she is gaining weight Advised her to discuss weight loss medications with [facility]   Screenings -  Std screen: pt gave verbal consent -  Lipid: will check levels -  Metabolic syndrome: discussed risk factors   Vitamin D deficiency-  Patient was not aware of vitamin D deficiency or taking prescribed supplement Refilled today  Asthma Deteriorating due to warm weather, weight gain and suspected deficiency   There is no height or weight on file to calculate BMI.  No follow-ups on file.    There is no height or weight on file to calculate BMI.:  Discussed the patient's BMI with patient. The BMI body mass index is unknown because there is no height or weight on file.     Future Appointments  Date Time Provider [place]  05/26/2019 11:00 AM Corum, Rex Kras, MD PCP-PCP [facility]    Patient Instructions       If you have lab work done today you will be contacted with your lab results within the next 2 weeks.  If you have not heard from [place] then please contact us. The fastest way to get your results is to register for My Chart.   IF you received an x-ray today, you will receive an invoice from [facility]. Please contact [facility] at [phone] with questions or concerns regarding your invoice.   IF you received labwork today, you will receive an invoice from [place]. Please contact [facility] at [phone] with questions or concerns regarding your invoice.   Our billing staff will not be able to assist you with questions regarding bills from these companies.  You will be contacted with the lab results as soon as they are available. The fastest way to get your results is to activate your My Chart account. Instructions are located on the last page of this paperwork. If you have not heard from [place] regarding the results in 2 weeks, please contact this office.

## 2019-05-07 LAB — CMP14+EGFR
ALT: 18 IU/L (ref 0–32)
AST: 13 IU/L (ref 0–40)
Albumin/Globulin Ratio: 1.5 (ref 1.2–2.2)
Albumin: 4.3 g/dL (ref 3.8–4.8)
Alkaline Phosphatase: 96 IU/L (ref 39–117)
BUN/Creatinine Ratio: 12 (ref 9–23)
BUN: 8 mg/dL (ref 6–20)
Bilirubin Total: 0.5 mg/dL (ref 0.0–1.2)
CO2: 22 mmol/L (ref 20–29)
Calcium: 9.4 mg/dL (ref 8.7–10.2)
Chloride: 102 mmol/L (ref 96–106)
Creatinine, Ser: 0.65 mg/dL (ref 0.57–1.00)
GFR calc Af Amer: 130 mL/min/{1.73_m2} (ref >59)
GFR calc non Af Amer: 113 mL/min/{1.73_m2} (ref >59)
Globulin, Total: 2.9 g/dL (ref 1.5–4.5)
Glucose: 86 mg/dL (ref 65–99)
Potassium: 4 mmol/L (ref 3.5–5.2)
Sodium: 137 mmol/L (ref 134–144)
Total Protein: 7.2 g/dL (ref 6.0–8.5)

## 2019-05-07 LAB — HEPATITIS B SURFACE ANTIGEN: Hepatitis B Surface Ag: NEGATIVE

## 2019-05-07 LAB — VITAMIN D 25 HYDROXY (VIT D DEFICIENCY, FRACTURES): Vit D, 25-Hydroxy: 10.2 ng/mL — ABNORMAL LOW (ref 30.0–100.0)

## 2019-05-07 LAB — LIPID PANEL
Chol/HDL Ratio: 2.4 ratio (ref 0.0–4.4)
Cholesterol, Total: 142 mg/dL (ref 100–199)
HDL: 58 mg/dL (ref >39)
LDL Calculated: 74 mg/dL (ref 0–99)
Triglycerides: 52 mg/dL (ref 0–149)
VLDL Cholesterol Cal: 10 mg/dL (ref 5–40)

## 2019-05-07 LAB — HIV ANTIBODY (ROUTINE TESTING W REFLEX): HIV Screen 4th Generation wRfx: NONREACTIVE

## 2019-05-07 LAB — RPR: RPR Ser Ql: NONREACTIVE

## 2019-05-07 LAB — HEMOGLOBIN A1C
Est. average glucose Bld gHb Est-mCnc: 117 mg/dL
Hgb A1c MFr Bld: 5.7 % — ABNORMAL HIGH (ref 4.8–5.6)

## 2019-05-09 LAB — GC/CHLAMYDIA PROBE AMP (~~LOC~~) NOT AT ARMC
Chlamydia: NEGATIVE
Neisseria Gonorrhea: NEGATIVE

## 2019-05-16 DIAGNOSIS — Z9884 Bariatric surgery status: Secondary | ICD-10-CM | POA: Insufficient documentation

## 2019-05-26 ENCOUNTER — Ambulatory Visit: Payer: BC Managed Care – PPO | Admitting: Family Medicine

## 2019-05-27 ENCOUNTER — Encounter: Payer: Self-pay | Admitting: Family Medicine

## 2019-06-07 DIAGNOSIS — G4733 Obstructive sleep apnea (adult) (pediatric): Secondary | ICD-10-CM | POA: Insufficient documentation

## 2019-06-21 ENCOUNTER — Telehealth: Payer: Self-pay

## 2019-06-21 ENCOUNTER — Ambulatory Visit (INDEPENDENT_AMBULATORY_CARE_PROVIDER_SITE_OTHER): Payer: BC Managed Care – PPO | Admitting: Family Medicine

## 2019-06-21 ENCOUNTER — Other Ambulatory Visit (HOSPITAL_COMMUNITY)
Admission: RE | Admit: 2019-06-21 | Discharge: 2019-06-21 | Disposition: A | Discharge status: Home / Self Care | Payer: BC Managed Care – PPO | Source: Ambulatory Visit | Attending: Family Medicine | Admitting: Family Medicine

## 2019-06-21 ENCOUNTER — Other Ambulatory Visit: Payer: Self-pay

## 2019-06-21 VITALS — BP 125/81 | HR 97 | Temp 98.7°F | Resp 16 | Ht 63.0 in | Wt 325.0 lb

## 2019-06-21 DIAGNOSIS — M7121 Synovial cyst of popliteal space [Baker], right knee: Secondary | ICD-10-CM | POA: Diagnosis not present

## 2019-06-21 DIAGNOSIS — N898 Other specified noninflammatory disorders of vagina: Secondary | ICD-10-CM

## 2019-06-21 DIAGNOSIS — F329 Major depressive disorder, single episode, unspecified: Secondary | ICD-10-CM | POA: Diagnosis not present

## 2019-06-21 MED ORDER — FLUCONAZOLE 150 MG PO TABS: 150.0000 mg | ORAL_TABLET | Freq: Every day | ORAL | 0 refills | Status: AC | PRN

## 2019-06-21 MED ORDER — CITALOPRAM HYDROBROMIDE 40 MG PO TABS: 20.0000 mg | ORAL_TABLET | Freq: Every day | ORAL | 1 refills | Status: DC

## 2019-06-21 NOTE — Patient Instructions (Addendum)
     If you have lab work done today you will be contacted with your lab results within the next 2 weeks.  If you have not heard from [place] then please contact us. The fastest way to get your results is to register for My Chart.   IF you received an x-ray today, you will receive an invoice from [facility]. Please contact [facility] at [phone] with questions or concerns regarding your invoice.   IF you received labwork today, you will receive an invoice from [place]. Please contact [facility] at [phone] with questions or concerns regarding your invoice.   Our billing staff will not be able to assist you with questions regarding bills from these companies.  You will be contacted with the lab results as soon as they are available. The fastest way to get your results is to activate your My Chart account. Instructions are located on the last page of this paperwork. If you have not heard from [place] regarding the results in 2 weeks, please contact this office.     Take diflucan today  Will notify when cultures are available Increase celexa to 40mg  /day Check counseling options-EAP and mental health benefits

## 2019-06-21 NOTE — Telephone Encounter (Signed)
Amber Daniels, CMA  

## 2019-06-21 NOTE — Progress Notes (Signed)
Acute Office Visit  Subjective:    Patient ID: Amber Daniels, female    DOB: 06/03/1981, 38 y.o.   MRN: [id number]  Chief Complaint  Patient presents with  . Cyst    on back on right knee/x 5 days  . Depression  . Vaginal Itching    pt would like std testing    HPI Patient is in today for : Pt with weight loss surgery-cardiology work up 7/15-[facility] in [place] completed an echo and bilat LE ultrasounds for pre-surgery cardio evaluation -Bakers Cyst identified on the right leg.  Pt states no pain or problems but she was told cyst could rupture with pain associated. Pt states OA right knee-pain associated -uses tylenol/ibuprofen. NO instability.  No speciality follow up for knee pain or cyst previously  Depression-pt taking Celexa 20mg -father passed away from lung cancer with metastasis and heart failure.  Diagnosed 2 months ago -increase stress for pt and family. 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 Reviewed PHQ9-12-improved on celexa-no SI/HI  Vaginal irritation-no discharge.  Yeast infection in the past. Pt concerned about possible STD from partner and would like evaluation.  No fever.    Past Medical History:  Diagnosis Date  . Allergy   . Anemia    during pregnancy   . Arthritis   . Asthma   . Depression   . Edema    lower extremities   . GERD (gastroesophageal reflux disease)   . Headache    occasional migraine   . Heart murmur   . Hypertension   . Morbid obesity (HCC)   . Osteoarthritis   . PONV (postoperative nausea and vomiting)   . Postpartum care following cesarean delivery (12/2) 11/01/2013  . Prediabetes   . Shortness of breath dyspnea    only asthma related   . Sleep apnea    not on CPAP, dx 01/2013 with Outpatietn overnight study at home.   . Swelling     Past Surgical History:  Procedure Laterality Date  . BREATH  TEK H PYLORI N/A 02/22/2015   Procedure: BREATH TEK H PYLORI;  Surgeon: Luretha MurphyMatthew Martin, MD;  [facility];  Service: General;  Laterality: N/A;  . CESAREAN SECTION    . CESAREAN SECTION N/A 11/01/2013   Procedure: REPEAT CESAREAN SECTION;  Surgeon: Serita KyleSheronette A Cousins, MD;  Location: [facility];  Service: Obstetrics;  Laterality: N/A;  EDD: 11/04/13  . LAPAROSCOPIC GASTRIC SLEEVE RESECTION N/A 06/25/2015   Procedure: LAPAROSCOPIC GASTRIC SLEEVE RESECTION;  Surgeon: Luretha MurphyMatthew Martin, MD;  Location: [facility];  Service: General;  Laterality: N/A;  . WISDOM TOOTH EXTRACTION      Family History  Problem Relation Age of Onset  . Diabetes Mother   . Hypertension Mother   . Depression Mother   . Anxiety disorder Mother   . Sleep apnea Mother   . Obesity Mother   . Diabetes Father   . Hypertension Father   . Hyperlipidemia Father   . Heart disease Father        cardiomegaly, CHF  . Anxiety disorder Father   . Obesity Father   . Cancer Father [id number]       lung cancer  . Mental illness Father   . Hypertension Brother   . Diabetes Brother   . Hypertension Maternal Grandmother   . Hypertension Maternal Grandfather   . Hypertension Sister  Social History   Socioeconomic History  . Marital status: Single    Spouse name: Not on file  . Number of children: 2  . Years of education: Not on file  . Highest education level: Not on file  Occupational History  . 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 Financial resource strain: Not on file  . Food insecurity    Worry: Not on file    Inability: Not on file  . Transportation needs    Medical: Not on file    Non-medical: Not on file  Tobacco Use  . Smoking status: Never Smoker  . Smokeless tobacco: Never Used  Substance and Sexual Activity  . Alcohol use: Yes    Alcohol/week: 7.0 standard drinks    Types: 7 Glasses of wine per week  . Drug use: No  . Sexual activity: Yes    Birth control/protection: I.U.D.  Lifestyle  .  Physical activity    Days per week: Not on file    Minutes per session: Not on file  . Stress: Not on file  Relationships  . Social Musicianconnections    Talks on phone: Not on file    Gets together: Not on file    Attends religious service: Not on file    Active member of club or organization: Not on file    Attends meetings of clubs or organizations: Not on file    Relationship status: Not on file  . Intimate partner violence    Fear of current or ex partner: Not on file    Emotionally abused: Not on file    Physically abused: Not on file    Forced sexual activity: Not on file  Other Topics Concern  . Not on file  Social History Narrative   Caffeine none.  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      Outpatient Medications Prior to Visit  Medication Sig Dispense Refill  . acetaminophen (TYLENOL) 500 MG tablet Take 1,500 mg by mouth every 6 (six) hours as needed. For pain.    Marland Kitchen. albuterol (VENTOLIN HFA) 108 (90 Base) MCG/ACT inhaler Inhale 2 puffs into the lungs every 6 (six) hours as needed for wheezing or shortness of breath. 18 g 5  . beclomethasone (QVAR) 40 MCG/ACT inhaler Inhale 2 puffs into the lungs daily. 1 Inhaler 1  . cetirizine (ZYRTEC) 10 MG tablet Take 10 mg by mouth daily.    . citalopram (CELEXA) 20 MG tablet Take 1 tablet (20 mg total) by mouth daily. 30 tablet 3  . famotidine (PEPCID) 20 MG tablet Take 1 tablet (20 mg total) 2 (two) times daily by mouth.    . fexofenadine (ALLEGRA) 180 MG tablet Take 180 mg by mouth daily.    . Fluocinolone Acetonide Scalp 0.01 % OIL Apply topically to affected areas 120 mL 1  . furosemide (LASIX) 20 MG tablet Take 1 tablet (20 mg total) by mouth daily. 30 tablet 0  . ketoconazole (NIZORAL) 2 % shampoo Apply 1 application topically 2 (two) times a week. 120 mL 1  . metroNIDAZOLE (METROGEL) 0.75 % gel Apply 1 application topically 2 (two) times daily.    . montelukast (SINGULAIR) 10 MG tablet Take 1 tablet (10 mg total) by mouth at  bedtime. 90 tablet 1  . omeprazole (PRILOSEC) 20 MG capsule Take 1 capsule (20 mg total) daily by mouth. 30 capsule 3  . pseudoephedrine (SUDAFED 12 HOUR) 120 MG 12 hr tablet Take 1 tablet (120 mg  total) by mouth 2 (two) times daily. 30 tablet 3  . triamcinolone (NASACORT ALLERGY 24HR CHILDREN) 55 MCG/ACT AERO nasal inhaler Place 2 sprays into the nose daily.    . Vitamin D, Ergocalciferol, (DRISDOL) 1.25 MG (50000 UT) CAPS capsule Take 1 capsule (50,000 Units total) by mouth every 7 (seven) days. 4 capsule 3  . fluconazole (DIFLUCAN) 150 MG tablet Take 1 tablet (150 mg total) by mouth daily as needed (take at least 3 days between doses as needed for vaginal irritaiton). (Patient not taking: Reported on 06/21/2019) 10 tablet 0  . sucralfate (CARAFATE) 1 g tablet TAKE 1 TABLET (1 G TOTAL) 4 (FOUR) TIMES DAILY - WITH MEALS AND AT BEDTIME BY MOUTH. 240 tablet 0   No facility-administered medications prior to visit.     Allergies  Allergen Reactions  . Other     Seasonal and enviromental allergies (dust)    Review of Systems  Constitutional: Negative for chills and fever.  Cardiovascular: Negative for palpitations.  Gastrointestinal: Negative for abdominal pain and nausea.  Genitourinary: Negative for dysuria and hematuria.  Skin: Positive for itching. Negative for rash.  Psychiatric/Behavioral: Positive for depression. Negative for suicidal ideas. The patient is not nervous/anxious.        Objective:    Physical Exam  Constitutional: She is oriented to person, place, and time. She appears well-developed and well-nourished. She appears distressed.  Genitourinary:    Vaginal discharge present.   Musculoskeletal:        General: Tenderness present.  Neurological: She is alert and oriented to person, place, and time.  Psychiatric: Her behavior is normal.  cells collected vaginally using a lighted speculum exam-no lesions noted no cervical motion tendernss Depressed mood noted -crying  with discussion about father course of treatment and death.pt with on line teaching difficulty and concern for decision on child in school with underlying medical problems Right posterior knee tenderness to palpation-FROM , no instability BP 125/81   Pulse 97   Temp 98.7 F (37.1 C) (Oral)   Resp 16   Ht 5\' 3"  (1.6 m)   Wt (!) 325 lb (147.4 kg)   SpO2 99%   BMI 57.57 kg/m  Wt Readings from Last 3 Encounters:  06/21/19 (!) 325 lb (147.4 kg)  05/06/19 (!) 322 lb 9.6 oz (146.3 kg)  04/27/19 (!) 323 lb 6.4 oz (146.7 kg)    There are no preventive care reminders to display for this patient.  There are no preventive care reminders to display for this patient.   Lab Results  Component Value Date   TSH 1.320 10/20/2018   Lab Results  Component Value Date   WBC 5.4 10/20/2018   HGB 12.2 10/20/2018   HCT 38.7 10/20/2018   MCV 86 10/20/2018   PLT 229 10/20/2018   Lab Results  Component Value Date   NA 137 05/06/2019   K 4.0 05/06/2019   CO2 22 05/06/2019   GLUCOSE 86 05/06/2019   BUN 8 05/06/2019   CREATININE 0.65 05/06/2019   BILITOT 0.5 05/06/2019   ALKPHOS 96 05/06/2019   AST 13 05/06/2019   ALT 18 05/06/2019   PROT 7.2 05/06/2019   ALBUMIN 4.3 05/06/2019   CALCIUM 9.4 05/06/2019   ANIONGAP 5 06/20/2015   Lab Results  Component Value Date   CHOL 142 05/06/2019   Lab Results  Component Value Date   HDL 58 05/06/2019   Lab Results  Component Value Date   LDLCALC 74 05/06/2019   Lab Results  Component Value Date   TRIG 52 05/06/2019   Lab Results  Component Value Date   CHOLHDL 2.4 05/06/2019   Lab Results  Component Value Date   HGBA1C 5.7 (H) 05/06/2019       Assessment & Plan:  1. Baker cyst, right - Ambulatory referral to Orthopedic Surgery  2. Vaginal itching Take one diflucan today then 1 in a week-cultures to be evaluated for STD-no in house wet prep/KOH today-sent for evaluation - Cervicovaginal ancillary only  3. Reactive depression  Recent death and acute illness of father-started celexa 5/20 at 20mg -improved but not stablized-PHQ9 improved-increase dose to 40mg  daily. D/w pt counseling-pt to see if EAP available through school system-many stressors, child care, return to school for pt and children, father's death  LISA Hannah Beat, MD

## 2019-06-22 ENCOUNTER — Telehealth: Payer: Self-pay | Admitting: Family Medicine

## 2019-06-22 DIAGNOSIS — E561 Deficiency of vitamin K: Secondary | ICD-10-CM | POA: Insufficient documentation

## 2019-06-22 DIAGNOSIS — E559 Vitamin D deficiency, unspecified: Secondary | ICD-10-CM | POA: Insufficient documentation

## 2019-06-22 NOTE — Telephone Encounter (Signed)
Sent [facility] [place] message informing patient to call [facility]

## 2019-06-23 ENCOUNTER — Other Ambulatory Visit: Payer: Self-pay | Admitting: Family Medicine

## 2019-06-23 DIAGNOSIS — M7121 Synovial cyst of popliteal space [Baker], right knee: Secondary | ICD-10-CM | POA: Insufficient documentation

## 2019-06-23 DIAGNOSIS — N898 Other specified noninflammatory disorders of vagina: Secondary | ICD-10-CM | POA: Insufficient documentation

## 2019-06-23 LAB — CERVICOVAGINAL ANCILLARY ONLY
Bacterial vaginitis: POSITIVE — AB
Candida vaginitis: NEGATIVE
Chlamydia: NEGATIVE
Neisseria Gonorrhea: NEGATIVE
Trichomonas: POSITIVE — AB

## 2019-06-23 MED ORDER — METRONIDAZOLE 500 MG PO TABS: 500.0000 mg | ORAL_TABLET | Freq: Three times a day (TID) | ORAL | 0 refills | Status: DC

## 2019-06-28 ENCOUNTER — Telehealth: Payer: Self-pay

## 2019-06-28 DIAGNOSIS — N898 Other specified noninflammatory disorders of vagina: Secondary | ICD-10-CM

## 2019-06-28 MED ORDER — METRONIDAZOLE 500 MG PO TABS: 500.0000 mg | ORAL_TABLET | Freq: Two times a day (BID) | ORAL | 0 refills | Status: DC

## 2019-06-28 NOTE — Telephone Encounter (Signed)
Amber Daniels, CMA  

## 2019-06-30 ENCOUNTER — Ambulatory Visit: Payer: BC Managed Care – PPO | Admitting: Physician Assistant

## 2019-07-05 ENCOUNTER — Encounter (INDEPENDENT_AMBULATORY_CARE_PROVIDER_SITE_OTHER): Payer: Self-pay | Admitting: Bariatrics

## 2019-07-05 ENCOUNTER — Ambulatory Visit: Payer: BC Managed Care – PPO | Admitting: Orthopaedic Surgery

## 2019-07-06 ENCOUNTER — Ambulatory Visit: Payer: Self-pay

## 2019-07-06 ENCOUNTER — Other Ambulatory Visit: Payer: Self-pay

## 2019-07-06 ENCOUNTER — Encounter: Payer: Self-pay | Admitting: Orthopaedic Surgery

## 2019-07-06 ENCOUNTER — Ambulatory Visit (INDEPENDENT_AMBULATORY_CARE_PROVIDER_SITE_OTHER): Payer: BC Managed Care – PPO | Admitting: Orthopaedic Surgery

## 2019-07-06 DIAGNOSIS — M25562 Pain in left knee: Secondary | ICD-10-CM

## 2019-07-06 DIAGNOSIS — M25561 Pain in right knee: Secondary | ICD-10-CM | POA: Diagnosis not present

## 2019-07-06 NOTE — Progress Notes (Signed)
Subjective: I have been asked to inject both knees with cortisone under ultrasound guidance due to large body habitus making blind injection difficult.  Objective: Trace effusion in both knees with no warmth or erythema.  Procedure: Bilateral knee injections: After sterile prep with Betadine, injected 5 cc 1% lidocaine without epinephrine and 40 mg methylprednisolone from lateral midpatellar approach into each knee joint recess using ultrasound to guide needle placement without difficulty.  She will follow-up as needed.

## 2019-07-06 NOTE — Progress Notes (Signed)
Office Visit Note   Patient: Amber Daniels           Date of Birth: 05/03/1981           MRN: [id number] Visit Date: 07/06/2019              Requested by: Maryruth Hancock, MD [place],  [place] [address] PCP: Forrest Moron, MD   Assessment & Plan: Visit Diagnoses:  1. Right knee pain, unspecified chronicity   2. Left knee pain, unspecified chronicity   3. Morbid obesity ([place])     Plan: Impression is bilateral knee osteoarthritis.  We will refer the patient to Dr. Junius Roads for ultrasound-guided cortisone injection to both knees.  She will follow-up with [place] as needed.  The patient meets the AMA guidelines for Morbid (severe) obesity with a BMI > 40.0 and I have recommended weight loss.  Follow-Up Instructions: Return if symptoms worsen or fail to improve.   Orders:  Orders Placed This Encounter  Procedures  . XR Knee Complete 4 Views Right  . XR Knee 1-2 Views Left   No orders of the defined types were placed in this encounter.     Procedures: No procedures performed   Clinical Data: No additional findings.   Subjective: Chief Complaint  Patient presents with  . Left Knee - Pain  . Right Knee - Pain    HPI patient is a pleasant 38 year old female who presents our clinic today with bilateral knee pain right greater than left.  Pain has been ongoing for the past year.  No known injury.  The pain is recently worsened as she is preparing for revision of previous weight loss surgery and is having to exercise more frequently to try and lose weight prior to surgical intervention.  The pain she has is to the entire knee worse going from a seated or lying position to a standing position.  She has tried Tylenol and Norco with mild to moderate relief of symptoms.  No radicular symptoms noted.  No previous cortisone injection or surgical intervention to either knee.  Review of Systems as detailed in HPI.  All others reviewed and are negative.   Objective: Vital  Signs: There were no vitals taken for this visit.  Physical Exam well-developed and well-nourished female no acute distress.  Alert and oriented x3.  Ortho Exam examination of both knees reveals morbid obesity.  Range of motion 0 to 110 degrees.  Medial joint line tenderness on the right lateral joint line tenderness on the left.  Mild patellofemoral crepitus.  She is neurovascularly intact distally.  Specialty Comments:  No specialty comments available.  Imaging: Xr Knee 1-2 Views Left  Result Date: 07/06/2019 Mild to moderate medial and patellofemoral joint space narrowing  Xr Knee Complete 4 Views Right  Result Date: 07/06/2019 Moderate medial joint space narrowing    PMFS History: Patient Active Problem List   Diagnosis Date Noted  . Baker cyst, right 06/23/2019  . Vaginal itching 06/23/2019  . Mild intermittent asthma without complication 58/52/7782  . Depression 04/27/2019  . Allergies 04/27/2019  . Edema 04/27/2019  . Insulin resistance 11/11/2018  . Heart murmur 10/25/2018  . 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 Obesity hypoventilation syndrome ([place]) 12/18/2014  . Snoring 12/18/2014  . Asthma with acute exacerbation 12/18/2014  . Morbid obesity ([place]) 11/17/2014  . HTN (hypertension) 11/17/2014  . Esophageal reflux 11/17/2014  . Bilateral edema of lower extremity 11/17/2014   Past Medical  History:  Diagnosis Date  . Allergy   . Anemia    during pregnancy   . Arthritis   . Asthma   . Depression   . Edema    lower extremities   . GERD (gastroesophageal reflux disease)   . Headache    occasional migraine   . Heart murmur   . Hypertension   . Morbid obesity (HCC)   . Osteoarthritis   . PONV (postoperative nausea and vomiting)   . Postpartum care following cesarean delivery (12/2) 11/01/2013  . Prediabetes   . Shortness of breath dyspnea    only asthma related   . Sleep apnea    not on CPAP, dx 01/2013 with Outpatietn overnight study  at home.   . Swelling     Family History  Problem Relation Age of Onset  . Diabetes Mother   . Hypertension Mother   . Depression Mother   . Anxiety disorder Mother   . Sleep apnea Mother   . Obesity Mother   . Diabetes Father   . Hypertension Father   . Hyperlipidemia Father   . Heart disease Father        cardiomegaly, CHF  . Anxiety disorder Father   . Obesity Father   . Cancer Father [id number]       lung cancer  . Mental illness Father   . Hypertension Brother   . Diabetes Brother   . Hypertension Maternal Grandmother   . Hypertension Maternal Grandfather   . Hypertension Sister     Past Surgical History:  Procedure Laterality Date  . BREATH TEK H PYLORI N/A 02/22/2015   Procedure: BREATH TEK H PYLORI;  Surgeon: Luretha MurphyMatthew Martin, MD;  [facility];  Service: General;  Laterality: N/A;  . CESAREAN SECTION    . CESAREAN SECTION N/A 11/01/2013   Procedure: REPEAT CESAREAN SECTION;  Surgeon: Serita KyleSheronette A Cousins, MD;  Location: [facility];  Service: Obstetrics;  Laterality: N/A;  EDD: 11/04/13  . LAPAROSCOPIC GASTRIC SLEEVE RESECTION N/A 06/25/2015   Procedure: LAPAROSCOPIC GASTRIC SLEEVE RESECTION;  Surgeon: Luretha MurphyMatthew Martin, MD;  Location: [facility];  Service: General;  Laterality: N/A;  . WISDOM TOOTH EXTRACTION     Social History   Occupational History  . 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 Smoking status: Never Smoker  . Smokeless tobacco: Never Used  Substance and Sexual Activity  . Alcohol use: Yes    Alcohol/week: 7.0 standard drinks    Types: 7 Glasses of wine per week  . Drug use: No  . Sexual activity: Yes    Birth control/protection: I.U.D.

## 2019-07-31 ENCOUNTER — Other Ambulatory Visit: Payer: Self-pay | Admitting: Family Medicine

## 2019-07-31 DIAGNOSIS — E559 Vitamin D deficiency, unspecified: Secondary | ICD-10-CM

## 2019-10-17 ENCOUNTER — Encounter: Payer: Self-pay | Admitting: Family Medicine

## 2019-10-17 ENCOUNTER — Other Ambulatory Visit: Payer: Self-pay

## 2019-10-17 ENCOUNTER — Telehealth (INDEPENDENT_AMBULATORY_CARE_PROVIDER_SITE_OTHER): Payer: Self-pay | Admitting: Family Medicine

## 2019-10-17 DIAGNOSIS — J309 Allergic rhinitis, unspecified: Secondary | ICD-10-CM

## 2019-10-17 DIAGNOSIS — G8929 Other chronic pain: Secondary | ICD-10-CM

## 2019-10-17 DIAGNOSIS — R059 Cough, unspecified: Secondary | ICD-10-CM

## 2019-10-17 DIAGNOSIS — R0982 Postnasal drip: Secondary | ICD-10-CM

## 2019-10-17 DIAGNOSIS — R05 Cough: Secondary | ICD-10-CM

## 2019-10-17 DIAGNOSIS — M25511 Pain in right shoulder: Secondary | ICD-10-CM

## 2019-10-17 NOTE — Progress Notes (Signed)
Cough going on a week. R.Arm pain going on a while (2-3 months). Shoulder to elbow area.

## 2019-10-17 NOTE — Progress Notes (Signed)
VIDEO Encounter- SOAP NOTE Established Patient  This VIDEO encounter was conducted with the patient's (or proxy's) verbal consent via VIDEO telecommunications: yes/no: Yes Patient was instructed to have this encounter in a suitably private space; and to only have persons present to whom they give permission to participate. In addition, patient identity was confirmed by use of name plus two identifiers (DOB and address).  I discussed the limitations, risks, security and privacy concerns of performing an evaluation and management service by telephone and the availability of in person appointments. I also discussed with the patient that there may be a patient responsible charge related to this service. The patient expressed understanding and agreed to proceed.  I spent a total of TIME; 0 MIN TO 60 MIN: 15 minutes talking with the patient or their proxy.  CC: cough, shoulder pain - right  Subjective   Amber Daniels is a 38 y.o. established patient. Telephone visit today for  HPI  Cough Allergies  Pt reports a tickle in the back of her throat The cough seems asthma related and not anything else It lasts a few minutes Worse at night Sleeps with her head propped up which helps her a little She has some chest tightness  She is using her albuterol bid She uses it before going to bed.  She reports that the albuterol helps sometimes. The cough is nonproductive She has a  History of reflux but there are no heart burn symptoms She has mild OSA but was never prescribed a cpap  She has a runny nose She is taking xyzal  Right shoulder pain She reports intermittent pain with the shoulder capsule  She states that if she tries to reach overhead She states that it feels very stiff This has been ongoing for 3 months She went to Ortho for her knees She had knee xray and it shows arthritis in her back and knees She uses biofreeze in her back and shoulder   Patient Active Problem List    Diagnosis Date Noted  . Baker cyst, right 06/23/2019  . Vaginal itching 06/23/2019  . Mild intermittent asthma without complication 04/27/2019  . Depression 04/27/2019  . Allergies 04/27/2019  . Edema 04/27/2019  . Insulin resistance 11/11/2018  . Heart murmur 10/25/2018  . 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 Obesity hypoventilation syndrome (HCC) 12/18/2014  . Snoring 12/18/2014  . Asthma with acute exacerbation 12/18/2014  . Morbid obesity (HCC) 11/17/2014  . HTN (hypertension) 11/17/2014  . Esophageal reflux 11/17/2014  . Bilateral edema of lower extremity 11/17/2014    Past Medical History:  Diagnosis Date  . Allergy   . Anemia    during pregnancy   . Arthritis   . Asthma   . Depression   . Edema    lower extremities   . GERD (gastroesophageal reflux disease)   . Headache    occasional migraine   . Heart murmur   . Hypertension   . Morbid obesity (HCC)   . Osteoarthritis   . PONV (postoperative nausea and vomiting)   . Postpartum care following cesarean delivery (12/2) 11/01/2013  . Prediabetes   . Shortness of breath dyspnea    only asthma related   . Sleep apnea    not on CPAP, dx 01/2013 with Outpatietn overnight study at home.   . Swelling     Current Outpatient Medications  Medication Sig Dispense Refill  . acetaminophen (TYLENOL) 500 MG tablet Take 1,500 mg by mouth every 6 (  six) hours as needed. For pain.    Marland Kitchen albuterol (VENTOLIN HFA) 108 (90 Base) MCG/ACT inhaler Inhale 2 puffs into the lungs every 6 (six) hours as needed for wheezing or shortness of breath. 18 g 5  . B Complex Vitamins (VITAMIN B-COMPLEX) TABS Take by mouth.    . beclomethasone (QVAR) 40 MCG/ACT inhaler Inhale 2 puffs into the lungs daily. 1 Inhaler 1  . citalopram (CELEXA) 40 MG tablet Take 0.5 tablets (20 mg total) by mouth daily. 30 tablet 1  . fluconazole (DIFLUCAN) 150 MG tablet Take 1 tablet (150 mg total) by mouth daily as needed (take at least 3 days  between doses as needed for vaginal irritaiton). 10 tablet 0  . Fluocinolone Acetonide Scalp 0.01 % OIL Apply topically to affected areas 120 mL 1  . HYDROcodone-acetaminophen (HYCET) 7.5-325 mg/15 ml solution TAKE 15 ML (7.5 MG OF HYDROCODONE TOTAL) BY MOUTH EVERY 4 (FOUR) HOURS AS NEEDED FOR UP TO 7 DAYS.    Marland Kitchen ketoconazole (NIZORAL) 2 % shampoo Apply 1 application topically 2 (two) times a week. 120 mL 1  . levocetirizine (XYZAL) 5 MG tablet Take 5 mg by mouth every evening.    . metroNIDAZOLE (FLAGYL) 500 MG tablet Take 1 tablet (500 mg total) by mouth 2 (two) times daily. 14 tablet 0  . metroNIDAZOLE (METROGEL) 0.75 % gel Apply 1 application topically 2 (two) times daily.    . montelukast (SINGULAIR) 10 MG tablet Take 1 tablet (10 mg total) by mouth at bedtime. 90 tablet 1  . ondansetron (ZOFRAN-ODT) 4 MG disintegrating tablet DISSOLVE 1 TABLET ON THE TONGUE EVERY 8 (EIGHT) HOURS AS NEEDED FOR NAUSEA FOR UP TO 7 DAYS.    Marland Kitchen pseudoephedrine (SUDAFED 12 HOUR) 120 MG 12 hr tablet Take 1 tablet (120 mg total) by mouth 2 (two) times daily. 30 tablet 3  . triamcinolone (NASACORT ALLERGY 24HR CHILDREN) 55 MCG/ACT AERO nasal inhaler Place 2 sprays into the nose daily.    . Vitamin D, Ergocalciferol, (DRISDOL) 1.25 MG (50000 UT) CAPS capsule TAKE 1 CAPSULE (50,000 UNITS TOTAL) BY MOUTH EVERY 7 (SEVEN) DAYS. 12 capsule 1  . cetirizine (ZYRTEC) 10 MG tablet Take 10 mg by mouth daily.    Marland Kitchen ELIQUIS 2.5 MG TABS tablet Take 2.5 mg by mouth 2 (two) times daily.    . famotidine (PEPCID) 20 MG tablet Take 1 tablet (20 mg total) 2 (two) times daily by mouth. (Patient not taking: Reported on 10/17/2019)    . fexofenadine (ALLEGRA) 180 MG tablet Take 180 mg by mouth daily.    . furosemide (LASIX) 20 MG tablet Take 1 tablet (20 mg total) by mouth daily. (Patient not taking: Reported on 10/17/2019) 30 tablet 0  . omeprazole (PRILOSEC) 20 MG capsule Take 1 capsule (20 mg total) daily by mouth. (Patient not taking:  Reported on 10/17/2019) 30 capsule 3  . sucralfate (CARAFATE) 1 g tablet TAKE 1 TABLET (1 G TOTAL) 4 (FOUR) TIMES DAILY - WITH MEALS AND AT BEDTIME BY MOUTH. (Patient not taking: Reported on 10/17/2019) 240 tablet 0   No current facility-administered medications for this visit.     Allergies  Allergen Reactions  . Bee Venom   . Other     Seasonal and enviromental allergies (dust)    Social History   Socioeconomic History  . Marital status: Single    Spouse name: Not on file  . Number of children: 2  . Years of education: Not on file  . Highest education  level: Not on file  Occupational History  . 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 Financial resource strain: Not on file  . Food insecurity    Worry: Not on file    Inability: Not on file  . Transportation needs    Medical: Not on file    Non-medical: Not on file  Tobacco Use  . Smoking status: Never Smoker  . Smokeless tobacco: Never Used  Substance and Sexual Activity  . Alcohol use: Yes    Alcohol/week: 7.0 standard drinks    Types: 7 Glasses of wine per week  . Drug use: No  . Sexual activity: Yes    Birth control/protection: I.U.D.  Lifestyle  . Physical activity    Days per week: Not on file    Minutes per session: Not on file  . Stress: Not on file  Relationships  . Social Herbalist on phone: Not on file    Gets together: Not on file    Attends religious service: Not on file    Active member of club or organization: Not on file    Attends meetings of clubs or organizations: Not on file    Relationship status: Not on file  . Intimate partner violence    Fear of current or ex partner: Not on file    Emotionally abused: Not on file    Physically abused: Not on file    Forced sexual activity: Not on file  Other Topics Concern  . Not on file  Social History Narrative   Caffeine none.  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      ROS Review of Systems  Constitutional:  Negative for activity change, appetite change, chills and fever.  HENT: Negative for congestion, nosebleeds, trouble swallowing and voice change.   Gastrointestinal: Negative for diarrhea, nausea and vomiting.  Genitourinary: Negative for difficulty urinating, dysuria, flank pain and hematuria.  Neurological: Negative for dizziness, speech difficulty, light-headedness and numbness.  See HPI. All other review of systems negative.   Objective   Vitals as reported by the patient: There were no vitals filed for this visit.  Exam: Physical Exam  Constitutional: Oriented to person, place, and time. Appears well-developed and well-nourished.  HENT:  Head: Normocephalic and atraumatic.  Eyes: Conjunctivae and EOM are normal.  Pulmonary/Chest: Effort normal. Pt coughed during the encounter and Cough is dry and brief. Neurological: Is alert and oriented to person, place, and time.  Psychiatric: Has a normal mood and affect. Behavior is normal. Judgment and thought content normal.    Diagnoses and all orders for this visit:  Chronic right shoulder pain  Cough  Allergic rhinitis with postnasal drip    Cough Allergic rhinitis with postnasal drip Continue xyzal, singulair and restart omeprazole  Add on flonase nasal spray  You should also increase your albuterol to three times a day  Right shoulder -  discuss with Orthopedics  It sounds like frozen shoulder or adhesive capsulitis with possible osteoarthritis.    I discussed the assessment and treatment plan with the patient. The patient was provided an opportunity to ask questions and all were answered. The patient agreed with the plan and demonstrated an understanding of the instructions.   The patient was advised to call back or seek an in-person evaluation if the symptoms worsen or if the condition fails to improve as anticipated.  I provided 15 minutes of face-to-face time during this encounter.  Forrest Moron, MD  Primary  Care at [facility]

## 2019-10-17 NOTE — Patient Instructions (Signed)
° ° ° °  If you have lab work done today you will be contacted with your lab results within the next 2 weeks.  If you have not heard from us then please contact us. The fastest way to get your results is to register for My Chart. ° ° °IF you received an x-ray today, you will receive an invoice from [facility]. Please contact [facility] at [phone] with questions or concerns regarding your invoice.  ° °IF you received labwork today, you will receive an invoice from [facility]. Please contact [facility] at [phone] with questions or concerns regarding your invoice.  ° °Our billing staff will not be able to assist you with questions regarding bills from these companies. ° °You will be contacted with the lab results as soon as they are available. The fastest way to get your results is to activate your My Chart account. Instructions are located on the last page of this paperwork. If you have not heard from us regarding the results in 2 weeks, please contact this office. °  ° ° ° °

## 2019-10-21 ENCOUNTER — Ambulatory Visit: Payer: BC Managed Care – PPO | Admitting: Orthopaedic Surgery

## 2019-10-21 ENCOUNTER — Encounter: Payer: Self-pay | Admitting: Orthopaedic Surgery

## 2019-10-21 ENCOUNTER — Ambulatory Visit: Payer: Self-pay

## 2019-10-21 ENCOUNTER — Other Ambulatory Visit: Payer: Self-pay

## 2019-10-21 VITALS — Ht 63.0 in | Wt 294.0 lb

## 2019-10-21 DIAGNOSIS — M25511 Pain in right shoulder: Secondary | ICD-10-CM

## 2019-10-21 DIAGNOSIS — G8929 Other chronic pain: Secondary | ICD-10-CM

## 2019-10-21 MED ORDER — METHYLPREDNISOLONE ACETATE 40 MG/ML IJ SUSP
40.0000 mg | INTRAMUSCULAR | Status: AC | PRN
  Administered 2019-10-21: 10:00:00 40 mg via INTRA_ARTICULAR

## 2019-10-21 MED ORDER — BUPIVACAINE HCL 0.25 % IJ SOLN
2.0000 mL | INTRAMUSCULAR | Status: AC | PRN
  Administered 2019-10-21: 10:00:00 2 mL via INTRA_ARTICULAR

## 2019-10-21 MED ORDER — LIDOCAINE HCL 2 % IJ SOLN
2.0000 mL | INTRAMUSCULAR | Status: AC | PRN
  Administered 2019-10-21: 2 mL

## 2019-10-21 NOTE — Progress Notes (Signed)
Office Visit Note   Patient: Amber Daniels           Date of Birth: 06/26/1981           MRN: [id number] Visit Date: 10/21/2019              Requested by: Doristine Bosworth, MD [address] [place],  [place] [address] PCP: Doristine Bosworth, MD   Assessment & Plan: Visit Diagnoses:  1. Chronic right shoulder pain     Plan: Impression is right shoulder subacromial bursitis and AC arthropathy.  We will proceed with a subacromial cortisone injection today.  Due to her exogenous obesity, we may need to repeat this under ultrasound or proceed with an ultrasound-guided [facility] joint injection.  She will let us know how she does in the next 2 weeks.  Follow-Up Instructions: Return if symptoms worsen or fail to improve.   Orders:  Orders Placed This Encounter  Procedures  . Large Joint Inj: R subacromial bursa  . XR Shoulder Right   No orders of the defined types were placed in this encounter.     Procedures: Large Joint Inj: R subacromial bursa on 10/21/2019 9:39 AM Indications: pain Details: 22 G needle Medications: 2 mL bupivacaine 0.25 %; 2 mL lidocaine 2 %; 40 mg methylPREDNISolone acetate 40 MG/ML Outcome: tolerated well, no immediate complications Patient was prepped and draped in the usual sterile fashion.       Clinical Data: No additional findings.   Subjective: Chief Complaint  Patient presents with  . Right Shoulder - Pain    HPI patient is a pleasant 38 year old female who presents our clinic today with right shoulder pain.  This is been ongoing for the past 6 months.  No specific injury but she does note that she was dealing with her father who had cancer and was sleeping on different types of surfaces to include air mattresses.  She is unsure whether or not this aggravated her shoulder.  All of her pain is to the deltoid.  She has occasional pain in the parascapular region.  Some pain to the top of the shoulder as well.  Her pain is worse with cross body adduction  as well as with the extremes of forward flexion.  She has increased pain sleeping on the right side as well.  She is unable to take any NSAIDs due to recent duodenal switch surgery.  She has been using Biofreeze with minimal relief of symptoms.  Review of Systems as detailed in HPI.  All others reviewed and are negative.   Objective: Vital Signs: Ht 5\' 3"  (1.6 m)   Wt 294 lb (133.4 kg)   LMP  (LMP Unknown)   BMI 52.08 kg/m   Physical Exam well-developed well-nourished female no acute distress.  Alert and oriented x3.  Ortho Exam examination of the right shoulder reveals full active range of motion.  She does have pain with the extremes of forward flexion and internal rotation she can only get to her back pocket.  Minimally positive empty can.  She has a positive cross body adduction.  Moderate tenderness to the [facility] joint.  She is neurovascularly intact distally.  Specialty Comments:  No specialty comments available.  Imaging: Xr Shoulder Right  Result Date: 10/21/2019 Joint space narrowing to the [facility] joint    PMFS History: Patient Active Problem List   Diagnosis Date Noted  . Baker cyst, right 06/23/2019  . Vaginal itching 06/23/2019  . Mild intermittent asthma without complication  04/27/2019  . Depression 04/27/2019  . Allergies 04/27/2019  . Edema 04/27/2019  . Insulin resistance 11/11/2018  . Heart murmur 10/25/2018  . 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 Obesity hypoventilation syndrome ([place]) 12/18/2014  . Snoring 12/18/2014  . Asthma with acute exacerbation 12/18/2014  . Morbid obesity ([place]) 11/17/2014  . HTN (hypertension) 11/17/2014  . Esophageal reflux 11/17/2014  . Bilateral edema of lower extremity 11/17/2014   Past Medical History:  Diagnosis Date  . Allergy   . Anemia    during pregnancy   . Arthritis   . Asthma   . Depression   . Edema    lower extremities   . GERD (gastroesophageal reflux disease)   . Headache    occasional  migraine   . Heart murmur   . Hypertension   . Morbid obesity ([place])   . Osteoarthritis   . PONV (postoperative nausea and vomiting)   . Postpartum care following cesarean delivery (12/2) 11/01/2013  . Prediabetes   . Shortness of breath dyspnea    only asthma related   . Sleep apnea    not on CPAP, dx 01/2013 with Outpatietn overnight study at home.   . Swelling     Family History  Problem Relation Age of Onset  . Diabetes Mother   . Hypertension Mother   . Depression Mother   . Anxiety disorder Mother   . Sleep apnea Mother   . Obesity Mother   . Diabetes Father   . Hypertension Father   . Hyperlipidemia Father   . Heart disease Father        cardiomegaly, CHF  . Anxiety disorder Father   . Obesity Father   . Cancer Father 98       lung cancer  . Mental illness Father   . Hypertension Brother   . Diabetes Brother   . Hypertension Maternal Grandmother   . Hypertension Maternal Grandfather   . Hypertension Sister     Past Surgical History:  Procedure Laterality Date  . BREATH TEK H PYLORI N/A 02/22/2015   Procedure: BREATH TEK H PYLORI;  Surgeon: Johnathan Hausen, MD;  Location: [facility];  Service: General;  Laterality: N/A;  . CESAREAN SECTION    . CESAREAN SECTION N/A 11/01/2013   Procedure: REPEAT CESAREAN SECTION;  Surgeon: Marvene Staff, MD;  Location: [facility];  Service: Obstetrics;  Laterality: N/A;  EDD: 11/04/13  . LAPAROSCOPIC GASTRIC SLEEVE RESECTION N/A 06/25/2015   Procedure: LAPAROSCOPIC GASTRIC SLEEVE RESECTION;  Surgeon: Johnathan Hausen, MD;  Location: [facility];  Service: General;  Laterality: N/A;  . WISDOM TOOTH EXTRACTION     Social History   Occupational History  . 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 Smoking status: Never Smoker  . Smokeless tobacco: Never Used  Substance and Sexual Activity  . Alcohol use: Yes    Alcohol/week: 7.0 standard drinks    Types: 7 Glasses of wine per week  . Drug use: No  . Sexual activity: Yes     Birth control/protection: I.U.D.

## 2019-10-23 ENCOUNTER — Other Ambulatory Visit: Payer: Self-pay | Admitting: Cardiology

## 2019-10-23 DIAGNOSIS — Z20822 Contact with and (suspected) exposure to covid-19: Secondary | ICD-10-CM

## 2019-10-24 LAB — NOVEL CORONAVIRUS, NAA: SARS-CoV-2, NAA: NOT DETECTED

## 2019-11-11 ENCOUNTER — Other Ambulatory Visit: Payer: Self-pay | Admitting: Specialist

## 2019-11-11 ENCOUNTER — Other Ambulatory Visit: Payer: Self-pay | Admitting: Physician Assistant

## 2019-11-11 DIAGNOSIS — R1011 Right upper quadrant pain: Secondary | ICD-10-CM

## 2019-11-28 ENCOUNTER — Ambulatory Visit
Admission: RE | Admit: 2019-11-28 | Discharge: 2019-11-28 | Disposition: A | Discharge status: Home / Self Care | Payer: BC Managed Care – PPO | Source: Ambulatory Visit | Attending: Specialist | Admitting: Specialist

## 2019-11-28 DIAGNOSIS — R1011 Right upper quadrant pain: Secondary | ICD-10-CM

## 2019-12-16 ENCOUNTER — Other Ambulatory Visit (HOSPITAL_COMMUNITY)
Admission: RE | Admit: 2019-12-16 | Discharge: 2019-12-16 | Disposition: A | Discharge status: Home / Self Care | Payer: BC Managed Care – PPO | Source: Ambulatory Visit | Attending: Family Medicine | Admitting: Family Medicine

## 2019-12-16 ENCOUNTER — Ambulatory Visit: Payer: BC Managed Care – PPO | Admitting: Family Medicine

## 2019-12-16 ENCOUNTER — Other Ambulatory Visit: Payer: Self-pay

## 2019-12-16 ENCOUNTER — Encounter: Payer: Self-pay | Admitting: Family Medicine

## 2019-12-16 VITALS — BP 100/67 | HR 60 | Temp 98.0°F | Resp 16 | Ht 63.0 in | Wt 281.0 lb

## 2019-12-16 DIAGNOSIS — N898 Other specified noninflammatory disorders of vagina: Secondary | ICD-10-CM

## 2019-12-16 DIAGNOSIS — I10 Essential (primary) hypertension: Secondary | ICD-10-CM

## 2019-12-16 DIAGNOSIS — Z6841 Body Mass Index (BMI) 40.0 and over, adult: Secondary | ICD-10-CM

## 2019-12-16 DIAGNOSIS — N760 Acute vaginitis: Secondary | ICD-10-CM | POA: Diagnosis not present

## 2019-12-16 DIAGNOSIS — Z113 Encounter for screening for infections with a predominantly sexual mode of transmission: Secondary | ICD-10-CM

## 2019-12-16 DIAGNOSIS — B9689 Other specified bacterial agents as the cause of diseases classified elsewhere: Secondary | ICD-10-CM

## 2019-12-16 LAB — POCT WET + KOH PREP
Trich by wet prep: ABSENT
Yeast by KOH: ABSENT
Yeast by wet prep: ABSENT

## 2019-12-16 MED ORDER — SUCRALFATE 1 G PO TABS: 1.0000 g | ORAL_TABLET | Freq: Every day | ORAL | 0 refills | Status: AC | PRN

## 2019-12-16 MED ORDER — FUROSEMIDE 20 MG PO TABS: 20.0000 mg | ORAL_TABLET | Freq: Every day | ORAL | 0 refills | Status: AC | PRN

## 2019-12-16 MED ORDER — METRONIDAZOLE 500 MG PO TABS: 500.0000 mg | ORAL_TABLET | Freq: Two times a day (BID) | ORAL | 0 refills | Status: DC

## 2019-12-16 NOTE — Patient Instructions (Signed)
° ° ° °  If you have lab work done today you will be contacted with your lab results within the next 2 weeks.  If you have not heard from us then please contact us. The fastest way to get your results is to register for My Chart. ° ° °IF you received an x-ray today, you will receive an invoice from [facility]. Please contact [facility] at [phone] with questions or concerns regarding your invoice.  ° °IF you received labwork today, you will receive an invoice from [facility]. Please contact [facility] at [phone] with questions or concerns regarding your invoice.  ° °Our billing staff will not be able to assist you with questions regarding bills from these companies. ° °You will be contacted with the lab results as soon as they are available. The fastest way to get your results is to activate your My Chart account. Instructions are located on the last page of this paperwork. If you have not heard from us regarding the results in 2 weeks, please contact this office. °  ° ° ° °

## 2019-12-16 NOTE — Progress Notes (Signed)
Established Patient Office Visit  Subjective:  Patient ID: Amber Daniels, female    DOB: 1981/03/18  Age: 39 y.o. MRN: [id number]  CC:  Chief Complaint  Patient presents with  . 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 She is prediabetic and denies any increased sugars She also has gallbladder sludge and is up for removal  Wt Readings from Last 3 Encounters:  12/16/19 281 lb (127.5 kg)  10/21/19 294 lb (133.4 kg)  06/21/19 (!) 325 lb (147.4 kg)   Body mass index is 49.78 kg/m.   Hypertension: Patient here for follow-up of elevated blood pressure. She is not exercising and is adherent to low salt diet.  Blood pressure is well controlled at home. Cardiac symptoms none. Patient denies chest pain, chest pressure/discomfort, claudication, dyspnea and exertional chest pressure/discomfort.   With her weight loss her bp has been decreasing She has some dizziness once or twice a month and her blood pressure is now in the 110/70s.  She is not long on furosemide daily. She takes it as needed for LE edema. She does not a cuff at home  BP Readings from Last 3 Encounters:  12/16/19 100/67  06/21/19 125/81  05/06/19 120/76     Past Medical History:  Diagnosis Date  . Allergy   . Anemia    during pregnancy   . Arthritis   . Asthma   . Depression   . Edema    lower extremities   . GERD (gastroesophageal reflux disease)   . Headache    occasional migraine   . Heart murmur   . Hypertension   .  Morbid obesity (HCC)   . Osteoarthritis   . PONV (postoperative nausea and vomiting)   . Postpartum care following cesarean delivery (12/2) 11/01/2013  . Prediabetes   . Shortness of breath dyspnea    only asthma related   . Sleep apnea    not on CPAP, dx 01/2013 with Outpatietn overnight study at home.   . Swelling     Past Surgical History:  Procedure Laterality Date  . BREATH TEK H PYLORI N/A 02/22/2015   Procedure: BREATH TEK H PYLORI;  Surgeon: Luretha Murphy, MD;  Location: [facility];  Service: General;  Laterality: N/A;  . CESAREAN SECTION    . CESAREAN SECTION N/A 11/01/2013   Procedure: REPEAT CESAREAN SECTION;  Surgeon: Serita Kyle, MD;  Location: [facility];  Service: Obstetrics;  Laterality: N/A;  EDD: 11/04/13  . LAPAROSCOPIC GASTRIC SLEEVE RESECTION N/A 06/25/2015   Procedure: LAPAROSCOPIC GASTRIC SLEEVE RESECTION;  Surgeon: Luretha Murphy, MD;  Location: [facility];  Service: General;  Laterality: N/A;  . WISDOM TOOTH EXTRACTION      Family History  Problem Relation Age of Onset  . Diabetes Mother   . Hypertension Mother   . Depression Mother   . Anxiety disorder Mother   . Sleep apnea Mother   . Obesity Mother   . Diabetes Father   .  Hypertension Father   . Hyperlipidemia Father   . Heart disease Father        cardiomegaly, CHF  . Anxiety disorder Father   . Obesity Father   . Cancer Father 37       lung cancer  . Mental illness Father   . Hypertension Brother   . Diabetes Brother   . Hypertension Maternal Grandmother   . Hypertension Maternal Grandfather   . Hypertension Sister     Social History   Socioeconomic History  . Marital status: Single    Spouse name: Not on file  . Number of children: 2  . Years of education: Not on file  . Highest education level: Not on file  Occupational History  . 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 Smoking status: Never Smoker  . Smokeless tobacco: Never Used  Substance and Sexual Activity  .  Alcohol use: Yes    Alcohol/week: 7.0 standard drinks    Types: 7 Glasses of wine per week  . Drug use: No  . Sexual activity: Yes    Birth control/protection: I.U.D.  Other Topics Concern  . Not on file  Social History Narrative   Caffeine none.  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     Social Determinants of Health   Financial Resource Strain:   . Difficulty of Paying Living Expenses: Not on file  Food Insecurity:   . Worried About Charity fundraiser in the Last Year: Not on file  . Ran Out of Food in the Last Year: Not on file  Transportation Needs:   . Lack of Transportation (Medical): Not on file  . Lack of Transportation (Non-Medical): Not on file  Physical Activity:   . Days of Exercise per Week: Not on file  . Minutes of Exercise per Session: Not on file  Stress:   . Feeling of Stress : Not on file  Social Connections:   . Frequency of Communication with Friends and Family: Not on file  . Frequency of Social Gatherings with Friends and Family: Not on file  . Attends Religious Services: Not on file  . Active Member of Clubs or Organizations: Not on file  . Attends Archivist Meetings: Not on file  . Marital Status: Not on file  Intimate Partner Violence:   . Fear of Current or Ex-Partner: Not on file  . Emotionally Abused: Not on file  . Physically Abused: Not on file  . Sexually Abused: Not on file    Outpatient Medications Prior to Visit  Medication Sig Dispense Refill  . acetaminophen (TYLENOL) 500 MG tablet Take 1,500 mg by mouth every 6 (six) hours as needed. For pain.    Marland Kitchen albuterol (VENTOLIN HFA) 108 (90 Base) MCG/ACT inhaler Inhale 2 puffs into the lungs every 6 (six) hours as needed for wheezing or shortness of breath. 18 g 5  . B Complex Vitamins (VITAMIN B-COMPLEX) TABS Take by mouth.    . beclomethasone (QVAR) 40 MCG/ACT inhaler Inhale 2 puffs into the lungs daily. 1 Inhaler 1  . cetirizine (ZYRTEC) 10 MG tablet Take 10 mg by mouth  daily.    . citalopram (CELEXA) 40 MG tablet Take 0.5 tablets (20 mg total) by mouth daily. 30 tablet 1  . ELIQUIS 2.5 MG TABS tablet Take 2.5 mg by mouth 2 (two) times daily.    . fexofenadine (ALLEGRA) 180 MG tablet Take 180 mg by mouth daily.    . fluconazole (DIFLUCAN)  150 MG tablet Take 1 tablet (150 mg total) by mouth daily as needed (take at least 3 days between doses as needed for vaginal irritaiton). 10 tablet 0  . Fluocinolone Acetonide Scalp 0.01 % OIL Apply topically to affected areas 120 mL 1  . HYDROcodone-acetaminophen (HYCET) 7.5-325 mg/15 ml solution TAKE 15 ML (7.5 MG OF HYDROCODONE TOTAL) BY MOUTH EVERY 4 (FOUR) HOURS AS NEEDED FOR UP TO 7 DAYS.    Marland Kitchen ketoconazole (NIZORAL) 2 % shampoo Apply 1 application topically 2 (two) times a week. 120 mL 1  . levocetirizine (XYZAL) 5 MG tablet Take 5 mg by mouth every evening.    . metroNIDAZOLE (METROGEL) 0.75 % gel Apply 1 application topically 2 (two) times daily.    . montelukast (SINGULAIR) 10 MG tablet Take 1 tablet (10 mg total) by mouth at bedtime. 90 tablet 1  . omeprazole (PRILOSEC) 20 MG capsule Take 1 capsule (20 mg total) daily by mouth. 30 capsule 3  . ondansetron (ZOFRAN-ODT) 4 MG disintegrating tablet DISSOLVE 1 TABLET ON THE TONGUE EVERY 8 (EIGHT) HOURS AS NEEDED FOR NAUSEA FOR UP TO 7 DAYS.    . Pancrelipase, Lip-Prot-Amyl, (CREON PO) Take by mouth daily.    . pantoprazole (PROTONIX) 40 MG tablet Take 40 mg by mouth daily.    . pseudoephedrine (SUDAFED 12 HOUR) 120 MG 12 hr tablet Take 1 tablet (120 mg total) by mouth 2 (two) times daily. 30 tablet 3  . triamcinolone (NASACORT ALLERGY 24HR CHILDREN) 55 MCG/ACT AERO nasal inhaler Place 2 sprays into the nose daily.    . Vitamin D, Ergocalciferol, (DRISDOL) 1.25 MG (50000 UT) CAPS capsule TAKE 1 CAPSULE (50,000 UNITS TOTAL) BY MOUTH EVERY 7 (SEVEN) DAYS. 12 capsule 1  . famotidine (PEPCID) 20 MG tablet Take 1 tablet (20 mg total) 2 (two) times daily by mouth. (Patient not  taking: Reported on 12/16/2019)    . furosemide (LASIX) 20 MG tablet Take 1 tablet (20 mg total) by mouth daily. (Patient not taking: Reported on 12/16/2019) 30 tablet 0  . metroNIDAZOLE (FLAGYL) 500 MG tablet Take 1 tablet (500 mg total) by mouth 2 (two) times daily. (Patient not taking: Reported on 12/16/2019) 14 tablet 0  . sucralfate (CARAFATE) 1 g tablet TAKE 1 TABLET (1 G TOTAL) 4 (FOUR) TIMES DAILY - WITH MEALS AND AT BEDTIME BY MOUTH. (Patient not taking: Reported on 10/17/2019) 240 tablet 0   No facility-administered medications prior to visit.    Allergies  Allergen Reactions  . Bee Venom   . Other     Seasonal and enviromental allergies (dust)    ROS Review of Systems Review of Systems  Constitutional: Negative for activity change, appetite change, chills and fever.  HENT: Negative for congestion, nosebleeds, trouble swallowing and voice change.   Respiratory: Negative for cough, shortness of breath and wheezing.   Gastrointestinal: Negative for diarrhea, nausea and vomiting.  Genitourinary: Negative for difficulty urinating, dysuria, flank pain and hematuria.  Musculoskeletal: Negative for back pain, joint swelling and neck pain.  Neurological: see hpi, speech difficulty, light-headedness and numbness.  See HPI. All other review of systems negative.     Objective:    Physical Exam  BP 100/67   Pulse 60   Temp 98 F (36.7 C) (Temporal)   Resp 16   Ht 5\' 3"  (1.6 m) Comment: per pat  Wt 281 lb (127.5 kg)   SpO2 100%   BMI 49.78 kg/m  Wt Readings from Last 3 Encounters:  12/16/19 281  lb (127.5 kg)  10/21/19 294 lb (133.4 kg)  06/21/19 (!) 325 lb (147.4 kg)   Physical Exam  Constitutional: Oriented to person, place, and time. Appears well-developed and well-nourished.  HENT:  Head: Normocephalic and atraumatic.  Eyes: Conjunctivae and EOM are normal.  Cardiovascular: Normal rate, regular rhythm, normal heart sounds and intact distal pulses.  No murmur heard.  Pulmonary/Chest: Effort normal and breath sounds normal. No stridor. No respiratory distress. Has no wheezes.  Neurological: Is alert and oriented to person, place, and time.  Skin: Skin is warm. Capillary refill takes less than 2 seconds.  Psychiatric: Has a normal mood and affect. Behavior is normal. Judgment and thought content normal.   Vaginal exam- Chaperone Present Labia normal bilaterally without skin lesions Urethral meatus normal appearing without erythema Vagina without discharge No CMT, ovaries small and not palpable Uterus midline, nontender   There are no preventive care reminders to display for this patient.  There are no preventive care reminders to display for this patient.  Lab Results  Component Value Date   TSH 1.320 10/20/2018   Lab Results  Component Value Date   WBC 5.4 10/20/2018   HGB 12.2 10/20/2018   HCT 38.7 10/20/2018   MCV 86 10/20/2018   PLT 229 10/20/2018   Lab Results  Component Value Date   NA 137 05/06/2019   K 4.0 05/06/2019   CO2 22 05/06/2019   GLUCOSE 86 05/06/2019   BUN 8 05/06/2019   CREATININE 0.65 05/06/2019   BILITOT 0.5 05/06/2019   ALKPHOS 96 05/06/2019   AST 13 05/06/2019   ALT 18 05/06/2019   PROT 7.2 05/06/2019   ALBUMIN 4.3 05/06/2019   CALCIUM 9.4 05/06/2019   ANIONGAP 5 06/20/2015   Lab Results  Component Value Date   CHOL 142 05/06/2019   Lab Results  Component Value Date   HDL 58 05/06/2019   Lab Results  Component Value Date   LDLCALC 74 05/06/2019   Lab Results  Component Value Date   TRIG 52 05/06/2019   Lab Results  Component Value Date   CHOLHDL 2.4 05/06/2019   Lab Results  Component Value Date   HGBA1C 5.7 (H) 05/06/2019      Assessment & Plan:   Problem List Items Addressed This Visit      Cardiovascular and Mediastinum   HTN (hypertension)  - continue diet control, advised pt to get a home bp monitor and use furosemide sparingly Will monitor    Relevant Medications    furosemide (LASIX) 20 MG tablet     Musculoskeletal and Integument   Vaginal itching   Relevant Medications   metroNIDAZOLE (FLAGYL) 500 MG tablet    Other Visit Diagnoses    Vaginal irritation    -  Primary Likely due to vaginal infection with BV   Relevant Orders   POCT Wet + KOH Prep ([facility]) (Completed)   GC/Chlamydia probe amp ([place])not at [facility]   Screening examination for STD (sexually transmitted disease)       Relevant Orders   Hepatitis B surface antigen   HIV antibody   RPR   Morbid obesity with BMI of 45.0-49.9, adult (HCC)    - discussed her improved weight loss   BV (bacterial vaginosis)  -   Discussed flagyl      Relevant Medications   metroNIDAZOLE (FLAGYL) 500 MG tablet      Meds ordered this encounter  Medications  . metroNIDAZOLE (FLAGYL) 500 MG tablet    Sig:  Take 1 tablet (500 mg total) by mouth 2 (two) times daily.    Dispense:  14 tablet    Refill:  0  . sucralfate (CARAFATE) 1 g tablet    Sig: Take 1 tablet (1 g total) by mouth daily as needed.    Dispense:  240 tablet    Refill:  0  . furosemide (LASIX) 20 MG tablet    Sig: Take 1 tablet (20 mg total) by mouth daily as needed.    Dispense:  30 tablet    Refill:  0    Follow-up: Return in about 4 months (around 04/14/2020) for hypertension .    Doristine BosworthZoe A Celene Pippins, MD

## 2019-12-17 LAB — RPR: RPR Ser Ql: NONREACTIVE

## 2019-12-17 LAB — HEPATITIS B SURFACE ANTIGEN: Hepatitis B Surface Ag: NEGATIVE

## 2019-12-17 LAB — HIV ANTIBODY (ROUTINE TESTING W REFLEX): HIV Screen 4th Generation wRfx: NONREACTIVE

## 2019-12-21 LAB — GC/CHLAMYDIA PROBE AMP (~~LOC~~) NOT AT ARMC
Chlamydia: NEGATIVE
Comment: NEGATIVE
Comment: NORMAL
Neisseria Gonorrhea: NEGATIVE

## 2020-02-05 ENCOUNTER — Other Ambulatory Visit: Payer: Self-pay | Admitting: Family Medicine

## 2020-02-05 DIAGNOSIS — E559 Vitamin D deficiency, unspecified: Secondary | ICD-10-CM

## 2020-02-05 NOTE — Telephone Encounter (Signed)
Requested medication (s) are due for refill today: yes  Requested medication (s) are on the active medication list: yes  Last refill:  08/02/19  Future visit scheduled: yes  Notes to clinic:  medication not delegated to NT to refill   Requested Prescriptions  Pending Prescriptions Disp Refills   Vitamin D, Ergocalciferol, (DRISDOL) 1.25 MG (50000 UNIT) CAPS capsule [Pharmacy Med Name: VITAMIN D2 1.25MG (50,000 UNIT)] 12 capsule 1    Sig: TAKE 1 CAPSULE (50,000 UNITS TOTAL) BY MOUTH EVERY 7 (SEVEN) DAYS.      Endocrinology:  Vitamins - Vitamin D Supplementation Failed - 02/05/2020  9:48 AM      Failed - 50,000 IU strengths are not delegated      Failed - Phosphate in normal range and within 360 days    No results found for: PHOS        Failed - Vitamin D in normal range and within 360 days    Vit D, 25-Hydroxy  Date Value Ref Range Status  05/06/2019 10.2 (L) 30.0 - 100.0 ng/mL Final    Comment:    Vitamin D deficiency has been defined by the Institute of Medicine and an Endocrine Society practice guideline as a level of serum 25-OH vitamin D less than 20 ng/mL (1,2). The Endocrine Society went on to further define vitamin D insufficiency as a level between 21 and 29 ng/mL (2). 1. IOM (Institute of Medicine). 2010. Dietary reference    intakes for calcium and D. [place]: The    [employer]. 2. Holick MF, Binkley [place], Bischoff-Ferrari HA, et al.    Evaluation, treatment, and prevention of vitamin D    deficiency: an Endocrine Society clinical practice    guideline. JCEM. 2011 Jul; 96(7):1911-30.           Passed - Ca in normal range and within 360 days    Calcium  Date Value Ref Range Status  05/06/2019 9.4 8.7 - 10.2 mg/dL Final   Calcium, Ion  Date Value Ref Range Status  01/01/2014 1.23 1.12 - 1.23 mmol/L Final          Passed - Valid encounter within last 12 months    Recent Outpatient Visits           1 month ago Vaginal irritation   [facility] at [facility] [place], Zoe A, MD   3 months ago Chronic right shoulder pain   Primary Care at [facility], Manus Rudd, MD   7 months ago Engineer, production cyst, right   Primary Care at [facility], Minerva Fester, MD   9 months ago Encounter for health maintenance examination in adult   Primary Care at [facility], [place] A, MD   9 months ago Depression, unspecified depression type   Primary Care at [facility], Minerva Fester, MD       Future Appointments             In 2 months Doristine Bosworth, MD Primary Care at [place], [facility]

## 2020-02-08 ENCOUNTER — Other Ambulatory Visit: Payer: Self-pay

## 2020-02-08 ENCOUNTER — Ambulatory Visit: Payer: BC Managed Care – PPO | Admitting: Family Medicine

## 2020-02-08 DIAGNOSIS — Z021 Encounter for pre-employment examination: Secondary | ICD-10-CM | POA: Diagnosis not present

## 2020-02-08 DIAGNOSIS — Z111 Encounter for screening for respiratory tuberculosis: Secondary | ICD-10-CM | POA: Diagnosis not present

## 2020-02-08 NOTE — Progress Notes (Signed)
Established Patient Office Visit  Subjective:  Patient ID: Amber Daniels, female    DOB: May 05, 1981  Age: 39 y.o. MRN: [id number]  CC:  Chief Complaint  Patient presents with  . health screening    school health screening with TB test, pt notes her arthritis is bothering her more than usual bu no other concerns    HPI Amber Daniels presents for    Patient reports that she will be switching from [facility] to [facility] and needs to get a health examination certificate with TB screening.   She has not concerns healthwise. She states that she would like to know if she should take the covid vaccine.  She reports that she has adverse reaction to the covid vaccine.   Obesity Wt Readings from Last 3 Encounters:  02/08/20 269 lb (122 kg)  12/16/19 281 lb (127.5 kg)  10/21/19 294 lb (133.4 kg)   She is working on weight loss by changing her intake of foods She is exercising at work She denies polyuria, polydipsia, polyphagia.  Past Medical History:  Diagnosis Date  . Allergy   . Anemia    during pregnancy   . Arthritis   . Asthma   . Depression   . Edema    lower extremities   . GERD (gastroesophageal reflux disease)   . Headache    occasional migraine   . Heart murmur   . Hypertension   . Morbid obesity (HCC)   . Osteoarthritis   . PONV (postoperative nausea and vomiting)   . Postpartum care following cesarean delivery (12/2) 11/01/2013  . Prediabetes   . Shortness of breath dyspnea    only asthma related   . Sleep apnea    not on CPAP, dx 01/2013 with Outpatietn overnight study at home.   . Swelling     Past Surgical History:  Procedure Laterality Date  . BREATH TEK H PYLORI N/A 02/22/2015   Procedure: BREATH TEK H PYLORI;  Surgeon: Luretha Murphy, MD;  Location: [facility];  Service: General;  Laterality: N/A;  . CESAREAN SECTION    . CESAREAN SECTION N/A 11/01/2013   Procedure: REPEAT CESAREAN SECTION;  Surgeon: Serita Kyle, MD;   Location: [facility];  Service: Obstetrics;  Laterality: N/A;  EDD: 11/04/13  . LAPAROSCOPIC GASTRIC SLEEVE RESECTION N/A 06/25/2015   Procedure: LAPAROSCOPIC GASTRIC SLEEVE RESECTION;  Surgeon: Luretha Murphy, MD;  Location: [facility];  Service: General;  Laterality: N/A;  . WISDOM TOOTH EXTRACTION      Family History  Problem Relation Age of Onset  . Diabetes Mother   . Hypertension Mother   . Depression Mother   . Anxiety disorder Mother   . Sleep apnea Mother   . Obesity Mother   . Diabetes Father   . Hypertension Father   . Hyperlipidemia Father   . Heart disease Father        cardiomegaly, CHF  . Anxiety disorder Father   . Obesity Father   . Cancer Father 58       lung cancer  . Mental illness Father   . Hypertension Brother   . Diabetes Brother   . Hypertension Maternal Grandmother   . Hypertension Maternal Grandfather   . Hypertension Sister     Social History   Socioeconomic History  . Marital status: Single    Spouse name: Not on file  . Number of children: 2  . Years of education: Not on file  . Highest education level: Not on  file  Occupational History  . 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 Smoking status: Never Smoker  . Smokeless tobacco: Never Used  Substance and Sexual Activity  . Alcohol use: Yes    Alcohol/week: 7.0 standard drinks    Types: 7 Glasses of wine per week  . Drug use: No  . Sexual activity: Yes    Birth control/protection: I.U.D.  Other Topics Concern  . Not on file  Social History Narrative   Caffeine none.  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     Social Determinants of Health   Financial Resource Strain:   . Difficulty of Paying Living Expenses: Not on file  Food Insecurity:   . Worried About Programme researcher, broadcasting/film/video in the Last Year: Not on file  . Ran Out of Food in the Last Year: Not on file  Transportation Needs:   . Lack of Transportation (Medical): Not on file  . Lack of Transportation (Non-Medical): Not on  file  Physical Activity:   . Days of Exercise per Week: Not on file  . Minutes of Exercise per Session: Not on file  Stress:   . Feeling of Stress : Not on file  Social Connections:   . Frequency of Communication with Friends and Family: Not on file  . Frequency of Social Gatherings with Friends and Family: Not on file  . Attends Religious Services: Not on file  . Active Member of Clubs or Organizations: Not on file  . Attends Banker Meetings: Not on file  . Marital Status: Not on file  Intimate Partner Violence:   . Fear of Current or Ex-Partner: Not on file  . Emotionally Abused: Not on file  . Physically Abused: Not on file  . Sexually Abused: Not on file    Outpatient Medications Prior to Visit  Medication Sig Dispense Refill  . acetaminophen (TYLENOL) 500 MG tablet Take 1,500 mg by mouth every 6 (six) hours as needed. For pain.    Marland Kitchen albuterol (VENTOLIN HFA) 108 (90 Base) MCG/ACT inhaler Inhale 2 puffs into the lungs every 6 (six) hours as needed for wheezing or shortness of breath. 18 g 5  . B Complex Vitamins (VITAMIN B-COMPLEX) TABS Take by mouth.    . beclomethasone (QVAR) 40 MCG/ACT inhaler Inhale 2 puffs into the lungs daily. 1 Inhaler 1  . cetirizine (ZYRTEC) 10 MG tablet Take 10 mg by mouth daily.    . citalopram (CELEXA) 40 MG tablet Take 0.5 tablets (20 mg total) by mouth daily. 30 tablet 1  . ELIQUIS 2.5 MG TABS tablet Take 2.5 mg by mouth 2 (two) times daily.    . fexofenadine (ALLEGRA) 180 MG tablet Take 180 mg by mouth daily.    . fluconazole (DIFLUCAN) 150 MG tablet Take 1 tablet (150 mg total) by mouth daily as needed (take at least 3 days between doses as needed for vaginal irritaiton). 10 tablet 0  . Fluocinolone Acetonide Scalp 0.01 % OIL Apply topically to affected areas 120 mL 1  . furosemide (LASIX) 20 MG tablet Take 1 tablet (20 mg total) by mouth daily as needed. 30 tablet 0  . HYDROcodone-acetaminophen (HYCET) 7.5-325 mg/15 ml solution TAKE  15 ML (7.5 MG OF HYDROCODONE TOTAL) BY MOUTH EVERY 4 (FOUR) HOURS AS NEEDED FOR UP TO 7 DAYS.    Marland Kitchen ketoconazole (NIZORAL) 2 % shampoo Apply 1 application topically 2 (two) times a week. 120 mL 1  . levocetirizine (XYZAL) 5 MG  tablet Take 5 mg by mouth every evening.    . metroNIDAZOLE (FLAGYL) 500 MG tablet Take 1 tablet (500 mg total) by mouth 2 (two) times daily. 14 tablet 0  . metroNIDAZOLE (METROGEL) 0.75 % gel Apply 1 application topically 2 (two) times daily.    . montelukast (SINGULAIR) 10 MG tablet Take 1 tablet (10 mg total) by mouth at bedtime. 90 tablet 1  . omeprazole (PRILOSEC) 20 MG capsule Take 1 capsule (20 mg total) daily by mouth. 30 capsule 3  . ondansetron (ZOFRAN-ODT) 4 MG disintegrating tablet DISSOLVE 1 TABLET ON THE TONGUE EVERY 8 (EIGHT) HOURS AS NEEDED FOR NAUSEA FOR UP TO 7 DAYS.    . Pancrelipase, Lip-Prot-Amyl, (CREON PO) Take by mouth daily.    . pantoprazole (PROTONIX) 40 MG tablet Take 40 mg by mouth daily.    . pseudoephedrine (SUDAFED 12 HOUR) 120 MG 12 hr tablet Take 1 tablet (120 mg total) by mouth 2 (two) times daily. 30 tablet 3  . sucralfate (CARAFATE) 1 g tablet Take 1 tablet (1 g total) by mouth daily as needed. 240 tablet 0  . triamcinolone (NASACORT ALLERGY 24HR CHILDREN) 55 MCG/ACT AERO nasal inhaler Place 2 sprays into the nose daily.    . Vitamin D, Ergocalciferol, (DRISDOL) 1.25 MG (50000 UNIT) CAPS capsule TAKE 1 CAPSULE (50,000 UNITS TOTAL) BY MOUTH EVERY 7 (SEVEN) DAYS. 12 capsule 1   No facility-administered medications prior to visit.    Allergies  Allergen Reactions  . Bee Venom   . Other     Seasonal and enviromental allergies (dust)    ROS Review of Systems Review of Systems  Constitutional: Negative for activity change, appetite change, chills and fever.  HENT: Negative for congestion, nosebleeds, trouble swallowing and voice change.   Respiratory: Negative for cough, shortness of breath and wheezing.   Gastrointestinal: Negative  for diarrhea, nausea and vomiting.  Genitourinary: Negative for difficulty urinating, dysuria, flank pain and hematuria.  Musculoskeletal: Negative for back pain, joint swelling and neck pain.  Neurological: Negative for dizziness, speech difficulty, light-headedness and numbness.  See HPI. All other review of systems negative.     Objective:    Physical Exam  BP 125/81   Pulse (!) 56   Temp (!) 97.5 F (36.4 C) (Temporal)   Resp 15   Ht 5\' 3"  (1.6 m)   Wt 269 lb (122 kg)   SpO2 98%   BMI 47.65 kg/m  Wt Readings from Last 3 Encounters:  02/08/20 269 lb (122 kg)  12/16/19 281 lb (127.5 kg)  10/21/19 294 lb (133.4 kg)   Physical Exam  Constitutional: Oriented to person, place, and time. Appears well-developed and well-nourished.  HENT:  Head: Normocephalic and atraumatic.  Eyes: Conjunctivae and EOM are normal.  Cardiovascular: Normal rate, regular rhythm, normal heart sounds and intact distal pulses.  No murmur heard. Pulmonary/Chest: Effort normal and breath sounds normal. No stridor. No respiratory distress. Has no wheezes.  Neurological: Is alert and oriented to person, place, and time.  Skin: Skin is warm. Capillary refill takes less than 2 seconds.  Psychiatric: Has a normal mood and affect. Behavior is normal. Judgment and thought content normal.    There are no preventive care reminders to display for this patient.  There are no preventive care reminders to display for this patient.  Lab Results  Component Value Date   TSH 1.320 10/20/2018   Lab Results  Component Value Date   WBC 5.4 10/20/2018   HGB 12.2  10/20/2018   HCT 38.7 10/20/2018   MCV 86 10/20/2018   PLT 229 10/20/2018   Lab Results  Component Value Date   NA 137 05/06/2019   K 4.0 05/06/2019   CO2 22 05/06/2019   GLUCOSE 86 05/06/2019   BUN 8 05/06/2019   CREATININE 0.65 05/06/2019   BILITOT 0.5 05/06/2019   ALKPHOS 96 05/06/2019   AST 13 05/06/2019   ALT 18 05/06/2019   PROT 7.2  05/06/2019   ALBUMIN 4.3 05/06/2019   CALCIUM 9.4 05/06/2019   ANIONGAP 5 06/20/2015   Lab Results  Component Value Date   CHOL 142 05/06/2019   Lab Results  Component Value Date   HDL 58 05/06/2019   Lab Results  Component Value Date   LDLCALC 74 05/06/2019   Lab Results  Component Value Date   TRIG 52 05/06/2019   Lab Results  Component Value Date   CHOLHDL 2.4 05/06/2019   Lab Results  Component Value Date   HGBA1C 5.7 (H) 05/06/2019      Assessment & Plan:   Problem List Items Addressed This Visit      Other   Morbid obesity (HCC) - Primary -  Improved weight     Other Visit Diagnoses    Screening for tuberculosis       Relevant Orders   QuantiFERON-TB Gold Plus    Screening completed for [facility]     No orders of the defined types were placed in this encounter.   Follow-up: Return if symptoms worsen or fail to improve.    Doristine Bosworth, MD

## 2020-02-08 NOTE — Patient Instructions (Addendum)
If you have lab work done today you will be contacted with your lab results within the next 2 weeks.  If you have not heard from [place] then please contact us. The fastest way to get your results is to register for My Chart.   IF you received an x-ray today, you will receive an invoice from [facility]. Please contact [facility] at [phone] with questions or concerns regarding your invoice.   IF you received labwork today, you will receive an invoice from [place]. Please contact [facility] at [phone] with questions or concerns regarding your invoice.   Our billing staff will not be able to assist you with questions regarding bills from these companies.  You will be contacted with the lab results as soon as they are available. The fastest way to get your results is to activate your My Chart account. Instructions are located on the last page of this paperwork. If you have not heard from [place] regarding the results in 2 weeks, please contact this office.     Obesity, Adult Obesity is the condition of having too much total body fat. Being overweight or obese means that your weight is greater than what is considered healthy for your body size. Obesity is determined by a measurement called BMI. BMI is an estimate of body fat and is calculated from height and weight. For adults, a BMI of 30 or higher is considered obese. Obesity can lead to other health concerns and major illnesses, including:  Stroke.  Coronary artery disease (CAD).  Type 2 diabetes.  Some types of cancer, including cancers of the colon, breast, uterus, and gallbladder.  Osteoarthritis.  High blood pressure (hypertension).  High cholesterol.  Sleep apnea.  Gallbladder stones.  Infertility problems. What are the causes? Common causes of this condition include:  Eating daily meals that are high in calories, sugar, and fat.  Being born with genes that may make you more likely to become  obese.  Having a medical condition that causes obesity, including: ? Hypothyroidism. ? Polycystic ovarian syndrome (PCOS). ? Binge-eating disorder. ? Cushing syndrome.  Taking certain medicines, such as steroids, antidepressants, and seizure medicines.  Not being physically active (sedentary lifestyle).  Not getting enough sleep.  Drinking high amounts of sugar-sweetened beverages, such as soft drinks. What increases the risk? The following factors may make you more likely to develop this condition:  Having a family history of obesity.  Being a woman of African American descent.  Being a man of Hispanic descent.  Living in an area with limited access to: ? [place] [facility], recreation centers, or sidewalks. ? Healthy food choices, such as grocery stores and farmers' markets. What are the signs or symptoms? The main sign of this condition is having too much body fat. How is this diagnosed? This condition is diagnosed based on:  Your BMI. If you are an adult with a BMI of 30 or higher, you are considered obese.  Your waist circumference. This measures the distance around your waistline.  Your skinfold thickness. Your health care provider may gently pinch a fold of your skin and measure it. You may have other tests to check for underlying conditions. How is this treated? Treatment for this condition often includes changing your lifestyle. Treatment may include some or all of the following:  Dietary changes. This may include developing a healthy meal plan.  Regular physical activity. This may include activity that causes your heart to beat faster (aerobic exercise) and strength training. Work  with your health care provider to design an exercise program that works for you.  Medicine to help you lose weight if you are unable to lose 1 pound a week after 6 weeks of healthy eating and more physical activity.  Treating conditions that cause the obesity (underlying conditions).  Surgery.  Surgical options may include gastric banding and gastric bypass. Surgery may be done if: ? Other treatments have not helped to improve your condition. ? You have a BMI of 40 or higher. ? You have life-threatening health problems related to obesity. Follow these instructions at home: Eating and drinking   Follow recommendations from your health care provider about what you eat and drink. Your health care provider may advise you to: ? Limit fast food, sweets, and processed snack foods. ? Choose low-fat options, such as low-fat milk instead of whole milk. ? Eat 5 or more servings of fruits or vegetables every day. ? Eat at home more often. This gives you more control over what you eat. ? Choose healthy foods when you eat out. ? Learn to read food labels. This will help you understand how much food is considered 1 serving. ? Learn what a healthy serving size is. ? Keep low-fat snacks available. ? Limit sugary drinks, such as soda, fruit juice, sweetened iced tea, and flavored milk.  Drink enough water to keep your urine pale yellow.  Do not follow a fad diet. Fad diets can be unhealthy and even dangerous. Physical activity  Exercise regularly, as told by your health care provider. ? Most adults should get up to 150 minutes of moderate-intensity exercise every week. ? Ask your health care provider what types of exercise are safe for you and how often you should exercise.  Warm up and stretch before being active.  Cool down and stretch after being active.  Rest between periods of activity. Lifestyle  Work with your health care provider and a dietitian to set a weight-loss goal that is healthy and reasonable for you.  Limit your screen time.  Find ways to reward yourself that do not involve food.  Do not drink alcohol if: ? Your health care provider tells you not to drink. ? You are pregnant, may be pregnant, or are planning to become pregnant.  If you drink alcohol: ? Limit  how much you use to:  0-1 drink a day for women.  0-2 drinks a day for men. ? Be aware of how much alcohol is in your drink. In the [place] one drink equals one 12 oz bottle of beer (355 mL), one 5 oz glass of wine (148 mL), or one 1 oz glass of hard liquor (44 mL). General instructions  Keep a weight-loss journal to keep track of the food you eat and how much exercise you get.  Take over-the-counter and prescription medicines only as told by your health care provider.  Take vitamins and supplements only as told by your health care provider.  Consider joining a support group. Your health care provider may be able to recommend a support group.  Keep all follow-up visits as told by your health care provider. This is important. Contact a health care provider if:  You are unable to meet your weight loss goal after 6 weeks of dietary and lifestyle changes. Get help right away if you are having:  Trouble breathing.  Suicidal thoughts or behaviors. Summary  Obesity is the condition of having too much total body fat.  Being overweight or obese means that  your weight is greater than what is considered healthy for your body size.  Work with your health care provider and a dietitian to set a weight-loss goal that is healthy and reasonable for you.  Exercise regularly, as told by your health care provider. Ask your health care provider what types of exercise are safe for you and how often you should exercise. This information is not intended to replace advice given to you by your health care provider. Make sure you discuss any questions you have with your health care provider. Document Revised: 07/22/2018 Document Reviewed: 07/22/2018 Elsevier Patient Education  2020 [employer].

## 2020-02-10 ENCOUNTER — Encounter: Payer: Self-pay | Admitting: Family Medicine

## 2020-02-10 LAB — QUANTIFERON-TB GOLD PLUS
QuantiFERON Mitogen Value: >10 IU/mL
QuantiFERON Nil Value: 0.1 IU/mL
QuantiFERON TB1 Ag Value: 0.1 IU/mL
QuantiFERON TB2 Ag Value: 0.09 IU/mL
QuantiFERON-TB Gold Plus: NEGATIVE

## 2020-03-28 ENCOUNTER — Ambulatory Visit (INDEPENDENT_AMBULATORY_CARE_PROVIDER_SITE_OTHER): Payer: BC Managed Care – PPO | Admitting: Family Medicine

## 2020-03-28 ENCOUNTER — Other Ambulatory Visit: Payer: Self-pay

## 2020-03-28 ENCOUNTER — Encounter: Payer: Self-pay | Admitting: Family Medicine

## 2020-03-28 VITALS — BP 124/72 | HR 84 | Temp 98.0°F | Resp 16 | Ht 63.0 in | Wt 260.0 lb

## 2020-03-28 DIAGNOSIS — I83811 Varicose veins of right lower extremities with pain: Secondary | ICD-10-CM | POA: Diagnosis not present

## 2020-03-28 DIAGNOSIS — R7303 Prediabetes: Secondary | ICD-10-CM | POA: Diagnosis not present

## 2020-03-28 DIAGNOSIS — I872 Venous insufficiency (chronic) (peripheral): Secondary | ICD-10-CM

## 2020-03-28 DIAGNOSIS — E559 Vitamin D deficiency, unspecified: Secondary | ICD-10-CM | POA: Diagnosis not present

## 2020-03-28 LAB — POCT GLYCOSYLATED HEMOGLOBIN (HGB A1C): Hemoglobin A1C: 4.8 % (ref 4.0–5.6)

## 2020-03-28 NOTE — Patient Instructions (Addendum)
  Wt Readings from Last 3 Encounters:  03/28/20 260 lb (117.9 kg)  02/08/20 269 lb (122 kg)  12/16/19 281 lb (127.5 kg)   You are continuing to lose weight!!  Keep up the good work!!   If you have lab work done today you will be contacted with your lab results within the next 2 weeks.  If you have not heard from [place] then please contact us. The fastest way to get your results is to register for My Chart.   IF you received an x-ray today, you will receive an invoice from [facility]. Please contact [facility] at [phone] with questions or concerns regarding your invoice.   IF you received labwork today, you will receive an invoice from [place]. Please contact [facility] at [phone] with questions or concerns regarding your invoice.   Our billing staff will not be able to assist you with questions regarding bills from these companies.  You will be contacted with the lab results as soon as they are available. The fastest way to get your results is to activate your My Chart account. Instructions are located on the last page of this paperwork. If you have not heard from [place] regarding the results in 2 weeks, please contact this office.

## 2020-03-28 NOTE — Progress Notes (Signed)
Established Patient Office Visit  Subjective:  Patient ID: Amber Daniels, female    DOB: 1981-08-07  Age: 39 y.o. MRN: [id number]  CC:  Chief Complaint  Patient presents with  . Leg Swelling    pt complains her legs are swelling and feel tight worse on the Rt side, itching of her rt leg, has been taking her fluid pills    HPI Wyolene Weimann presents for   Patient reports that she is working on  Patient reports that she has noticed bilaterally lower extremity edema  She states that she noticed that she has new varicose veins on her lower leg She states that she takes her water pill but it is not helping much It leads to redness and itching of the lower extremities.  She is exercising at least twice a week.  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 Allergy   . Anemia    during pregnancy   . Arthritis   . Asthma   . Depression   . Edema    lower extremities   . GERD (gastroesophageal reflux disease)   . Headache    occasional migraine   . Heart murmur   . Hypertension   . Morbid obesity ([place])   . Osteoarthritis   . PONV (postoperative nausea and vomiting)   . Postpartum care following cesarean delivery (12/2) 11/01/2013  . Prediabetes   . Shortness of breath dyspnea    only asthma related   . Sleep apnea    not on CPAP, dx 01/2013 with Outpatietn overnight study at home.   . Swelling     Past Surgical History:  Procedure Laterality Date  . BREATH TEK H PYLORI N/A 02/22/2015   Procedure: BREATH TEK H PYLORI;  Surgeon: Johnathan Hausen, MD;  Location: [facility];  Service: General;  Laterality: N/A;  . CESAREAN SECTION    . CESAREAN SECTION N/A 11/01/2013   Procedure: REPEAT CESAREAN SECTION;  Surgeon: Marvene Staff, MD;  Location: [facility];  Service: Obstetrics;   Laterality: N/A;  EDD: 11/04/13  . LAPAROSCOPIC GASTRIC SLEEVE RESECTION N/A 06/25/2015   Procedure: LAPAROSCOPIC GASTRIC SLEEVE RESECTION;  Surgeon: Johnathan Hausen, MD;  Location: [facility];  Service: General;  Laterality: N/A;  . WISDOM TOOTH EXTRACTION      Family History  Problem Relation Age of Onset  . Diabetes Mother   . Hypertension Mother   . Depression Mother   . Anxiety disorder Mother   . Sleep apnea Mother   . Obesity Mother   . Diabetes Father   . Hypertension Father   . Hyperlipidemia Father   . Heart disease Father        cardiomegaly, CHF  . Anxiety disorder Father   . Obesity Father   . Cancer Father 9       lung cancer  . Mental illness Father   . Hypertension Brother   . Diabetes Brother   . Hypertension Maternal Grandmother   . Hypertension Maternal Grandfather   . Hypertension Sister     Social History   Socioeconomic History  . Marital status: Single    Spouse name: Not on file  . Number of children: 2  . Years of education: Not on file  . Highest education level: Not on file  Occupational History  . 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 Smoking status: Never Smoker  . Smokeless tobacco: Never Used  Substance and Sexual Activity  . Alcohol use: Yes    Alcohol/week: 7.0 standard drinks    Types: 7 Glasses of wine per week  . Drug use: No  . Sexual activity: Yes    Birth control/protection: I.U.D.  Other Topics Concern  . Not on file  Social History Narrative   Caffeine none.  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     Social Determinants of Health   Financial Resource Strain:   . Difficulty of Paying Living Expenses:   Food Insecurity:   . Worried About Charity fundraiser in the Last Year:   . Arboriculturist in the Last Year:   Transportation Needs:   . Film/video editor (Medical):   Marland Kitchen Lack of Transportation (Non-Medical):   Physical Activity:   . Days of Exercise per Week:   . Minutes of Exercise per Session:    Stress:   . Feeling of Stress :   Social Connections:   . Frequency of Communication with Friends and Family:   . Frequency of Social Gatherings with Friends and Family:   . Attends Religious Services:   . Active Member of Clubs or Organizations:   . Attends Archivist Meetings:   Marland Kitchen Marital Status:   Intimate Partner Violence:   . Fear of Current or Ex-Partner:   . Emotionally Abused:   Marland Kitchen Physically Abused:   . Sexually Abused:     Outpatient Medications Prior to Visit  Medication Sig Dispense Refill  . acetaminophen (TYLENOL) 500 MG tablet Take 1,500 mg by mouth every 6 (six) hours as needed. For pain.    Marland Kitchen albuterol (VENTOLIN HFA) 108 (90 Base) MCG/ACT inhaler Inhale 2 puffs into the lungs every 6 (six) hours as needed for wheezing or shortness of breath. 18 g 5  . B Complex Vitamins (VITAMIN B-COMPLEX) TABS Take by mouth.    . beclomethasone (QVAR) 40 MCG/ACT inhaler Inhale 2 puffs into the lungs daily. 1 Inhaler 1  . cetirizine (ZYRTEC) 10 MG tablet Take 10 mg by mouth daily.    . citalopram (CELEXA) 40 MG tablet Take 0.5 tablets (20 mg total) by mouth daily. 30 tablet 1  . ELIQUIS 2.5 MG TABS tablet Take 2.5 mg by mouth 2 (two) times daily.    . fexofenadine (ALLEGRA) 180 MG tablet Take 180 mg by mouth daily.    . fluconazole (DIFLUCAN) 150 MG tablet Take 1 tablet (150 mg total) by mouth daily as needed (take at least 3 days between doses as needed for vaginal irritaiton). 10 tablet 0  . Fluocinolone Acetonide Scalp 0.01 % OIL Apply topically to affected areas 120 mL 1  . furosemide (LASIX) 20 MG tablet Take 1 tablet (20 mg total) by mouth daily as needed. 30 tablet 0  . HYDROcodone-acetaminophen (HYCET) 7.5-325 mg/15 ml solution TAKE 15 ML (7.5 MG OF HYDROCODONE TOTAL) BY MOUTH EVERY 4 (FOUR) HOURS AS NEEDED FOR UP TO 7 DAYS.    Marland Kitchen ketoconazole (NIZORAL) 2 % shampoo Apply 1 application topically 2 (two) times a week. 120 mL 1  . levocetirizine (XYZAL) 5 MG tablet Take  5 mg by mouth every evening.    . metroNIDAZOLE (FLAGYL) 500 MG tablet Take 1 tablet (500 mg total) by mouth 2 (two) times daily. 14 tablet 0  . metroNIDAZOLE (METROGEL) 0.75 %  gel Apply 1 application topically 2 (two) times daily.    . montelukast (SINGULAIR) 10 MG tablet Take 1 tablet (10 mg total) by mouth at bedtime. 90 tablet 1  . omeprazole (PRILOSEC) 20 MG capsule Take 1 capsule (20 mg total) daily by mouth. 30 capsule 3  . ondansetron (ZOFRAN-ODT) 4 MG disintegrating tablet DISSOLVE 1 TABLET ON THE TONGUE EVERY 8 (EIGHT) HOURS AS NEEDED FOR NAUSEA FOR UP TO 7 DAYS.    . Pancrelipase, Lip-Prot-Amyl, (CREON PO) Take by mouth daily.    . pantoprazole (PROTONIX) 40 MG tablet Take 40 mg by mouth daily.    . pseudoephedrine (SUDAFED 12 HOUR) 120 MG 12 hr tablet Take 1 tablet (120 mg total) by mouth 2 (two) times daily. 30 tablet 3  . triamcinolone (NASACORT ALLERGY 24HR CHILDREN) 55 MCG/ACT AERO nasal inhaler Place 2 sprays into the nose daily.    . Vitamin D, Ergocalciferol, (DRISDOL) 1.25 MG (50000 UNIT) CAPS capsule TAKE 1 CAPSULE (50,000 UNITS TOTAL) BY MOUTH EVERY 7 (SEVEN) DAYS. 12 capsule 1  . sucralfate (CARAFATE) 1 g tablet Take 1 tablet (1 g total) by mouth daily as needed. 240 tablet 0   No facility-administered medications prior to visit.    Allergies  Allergen Reactions  . Bee Venom   . Other     Seasonal and enviromental allergies (dust)    ROS Review of Systems  Review of Systems  Constitutional: Negative for activity change, appetite change, chills and fever.  HENT: Negative for congestion, nosebleeds, trouble swallowing and voice change.   Respiratory: Negative for cough, shortness of breath and wheezing.   Gastrointestinal: Negative for diarrhea, nausea and vomiting.  Genitourinary: Negative for difficulty urinating, dysuria, flank pain and hematuria.  Musculoskeletal: Negative for back pain, joint swelling and neck pain.  Neurological: Negative for dizziness,  speech difficulty, light-headedness and numbness.  See HPI. All other review of systems negative.      Objective:    Physical Exam  BP 124/72   Pulse 84   Temp 98 F (36.7 C) (Temporal)   Resp 16   Ht '5\' 3"'$  (1.6 m)   Wt 260 lb (117.9 kg)   SpO2 99%   BMI 46.06 kg/m  Wt Readings from Last 3 Encounters:  03/28/20 260 lb (117.9 kg)  02/08/20 269 lb (122 kg)  12/16/19 281 lb (127.5 kg)   Physical Exam  Constitutional: Oriented to person, place, and time. Appears well-developed and morbidly obese.  HENT:  Head: Normocephalic and atraumatic.  Eyes: Conjunctivae and EOM are normal.  Cardiovascular: Normal rate, regular rhythm, normal heart sounds and intact distal pulses.  No murmur heard. Pulmonary/Chest: Effort normal and breath sounds normal. No stridor. No respiratory distress. Has no wheezes.  Neurological: Is alert and oriented to person, place, and time.  Skin: Skin is warm. Capillary refill takes less than 2 seconds.  Psychiatric: Has a normal mood and affect. Behavior is normal. Judgment and thought content normal.   Extremities Left calf 20 inches, 50cm, Right calf 19inches, 49 cm Trace edema Cap refill <3s No ulceration Right calf varicose vein without erythema  There are no preventive care reminders to display for this patient.  There are no preventive care reminders to display for this patient.  Lab Results  Component Value Date   TSH 1.320 10/20/2018   Lab Results  Component Value Date   WBC 5.4 10/20/2018   HGB 12.2 10/20/2018   HCT 38.7 10/20/2018   MCV 86 10/20/2018  PLT 229 10/20/2018   Lab Results  Component Value Date   NA 137 05/06/2019   K 4.0 05/06/2019   CO2 22 05/06/2019   GLUCOSE 86 05/06/2019   BUN 8 05/06/2019   CREATININE 0.65 05/06/2019   BILITOT 0.5 05/06/2019   ALKPHOS 96 05/06/2019   AST 13 05/06/2019   ALT 18 05/06/2019   PROT 7.2 05/06/2019   ALBUMIN 4.3 05/06/2019   CALCIUM 9.4 05/06/2019   ANIONGAP 5 06/20/2015     Lab Results  Component Value Date   CHOL 142 05/06/2019   Lab Results  Component Value Date   HDL 58 05/06/2019   Lab Results  Component Value Date   LDLCALC 74 05/06/2019   Lab Results  Component Value Date   TRIG 52 05/06/2019   Lab Results  Component Value Date   CHOLHDL 2.4 05/06/2019   Lab Results  Component Value Date   HGBA1C 5.7 (H) 05/06/2019      Assessment & Plan:   Problem List Items Addressed This Visit    None    Visit Diagnoses    Vitamin D deficiency    -  Primary   Relevant Orders   Vitamin D, 25-hydroxy   CMP14+EGFR   Prediabetes       Relevant Orders   POCT glycosylated hemoglobin (Hb A1C)   Venous (peripheral) insufficiency       Relevant Orders   CMP14+EGFR   VAS [place] LOWER EXT SAPHENOUS VEIN MAPPING   Varicose veins of right lower extremity with pain       Relevant Orders   VAS [place] LOWER EXT SAPHENOUS VEIN MAPPING     Vitamin D - very low last check, will reassess Prediabetes and Morbid Obesity -  Pt has increased exercise, will continue to emphasize goal 5 days a week of exercise Venous insufficiency and varicose veins - recommended DASH diet, compression stockings, venous mapping, not concerned about VTE at this time Follow up appt already for May 12. Will keep that appointment.  No orders of the defined types were placed in this encounter.   Follow-up: No follow-ups on file.    Forrest Moron, MD

## 2020-03-29 LAB — CMP14+EGFR
ALT: 35 IU/L — ABNORMAL HIGH (ref 0–32)
AST: 24 IU/L (ref 0–40)
Albumin/Globulin Ratio: 1.8 (ref 1.2–2.2)
Albumin: 4.3 g/dL (ref 3.8–4.8)
Alkaline Phosphatase: 134 IU/L — ABNORMAL HIGH (ref 39–117)
BUN/Creatinine Ratio: 9 (ref 9–23)
BUN: 6 mg/dL (ref 6–20)
Bilirubin Total: 0.8 mg/dL (ref 0.0–1.2)
CO2: 24 mmol/L (ref 20–29)
Calcium: 9 mg/dL (ref 8.7–10.2)
Chloride: 102 mmol/L (ref 96–106)
Creatinine, Ser: 0.66 mg/dL (ref 0.57–1.00)
GFR calc Af Amer: 129 mL/min/{1.73_m2} (ref >59)
GFR calc non Af Amer: 112 mL/min/{1.73_m2} (ref >59)
Globulin, Total: 2.4 g/dL (ref 1.5–4.5)
Glucose: 83 mg/dL (ref 65–99)
Potassium: 3.6 mmol/L (ref 3.5–5.2)
Sodium: 141 mmol/L (ref 134–144)
Total Protein: 6.7 g/dL (ref 6.0–8.5)

## 2020-03-29 LAB — VITAMIN D 25 HYDROXY (VIT D DEFICIENCY, FRACTURES): Vit D, 25-Hydroxy: 37.8 ng/mL (ref 30.0–100.0)

## 2020-04-11 ENCOUNTER — Ambulatory Visit: Payer: BC Managed Care – PPO | Admitting: Family Medicine

## 2020-04-11 DIAGNOSIS — I10 Essential (primary) hypertension: Secondary | ICD-10-CM

## 2020-04-12 ENCOUNTER — Encounter: Payer: Self-pay | Admitting: Family Medicine

## 2020-07-11 ENCOUNTER — Ambulatory Visit: Payer: BC Managed Care – PPO | Admitting: Podiatry

## 2020-08-24 ENCOUNTER — Ambulatory Visit (INDEPENDENT_AMBULATORY_CARE_PROVIDER_SITE_OTHER): Payer: BC Managed Care – PPO | Admitting: Orthopaedic Surgery

## 2020-08-24 VITALS — Ht 63.0 in | Wt 235.0 lb

## 2020-08-24 DIAGNOSIS — M25561 Pain in right knee: Secondary | ICD-10-CM | POA: Diagnosis not present

## 2020-08-24 DIAGNOSIS — G8929 Other chronic pain: Secondary | ICD-10-CM | POA: Diagnosis not present

## 2020-08-24 DIAGNOSIS — M25511 Pain in right shoulder: Secondary | ICD-10-CM

## 2020-08-24 DIAGNOSIS — M25562 Pain in left knee: Secondary | ICD-10-CM

## 2020-08-24 MED ORDER — METHYLPREDNISOLONE ACETATE 40 MG/ML IJ SUSP
40.0000 mg | INTRAMUSCULAR | Status: AC | PRN
  Administered 2020-08-24: 40 mg via INTRA_ARTICULAR

## 2020-08-24 MED ORDER — LIDOCAINE HCL 1 % IJ SOLN
2.0000 mL | INTRAMUSCULAR | Status: AC | PRN
  Administered 2020-08-24: 2 mL

## 2020-08-24 MED ORDER — LIDOCAINE HCL 1 % IJ SOLN
3.0000 mL | INTRAMUSCULAR | Status: AC | PRN
  Administered 2020-08-24: 3 mL

## 2020-08-24 MED ORDER — BUPIVACAINE HCL 0.5 % IJ SOLN
2.0000 mL | INTRAMUSCULAR | Status: AC | PRN
  Administered 2020-08-24: 2 mL via INTRA_ARTICULAR

## 2020-08-24 MED ORDER — BUPIVACAINE HCL 0.5 % IJ SOLN
3.0000 mL | INTRAMUSCULAR | Status: AC | PRN
  Administered 2020-08-24: 3 mL via INTRA_ARTICULAR

## 2020-08-24 NOTE — Progress Notes (Signed)
Office Visit Note   Patient: Amber Daniels           Date of Birth: 01-02-1981           MRN: [id number] Visit Date: 08/24/2020              Requested by: Doristine Bosworth, MD [phone] [address] [place],  [place] [address] PCP: Doristine Bosworth, MD   Assessment & Plan: Visit Diagnoses:  1. Chronic right shoulder pain   2. Chronic pain of both knees     Plan: Impression is chronic right shoulder pain and bilateral knee pain.  Injections performed for all 3 body parts today.  We will also make a referral for physical therapy to address.  She also has myofascial syndrome of the right shoulder and trapezius.  Follow-up as needed.  Follow-Up Instructions: Return if symptoms worsen or fail to improve.   Orders:  Orders Placed This Encounter  Procedures  . Ambulatory referral to Physical Therapy   No orders of the defined types were placed in this encounter.     Procedures: Large Joint Inj: R subacromial bursa on 08/24/2020 8:31 AM Indications: pain Details: 22 G needle  Arthrogram: No  Medications: 3 mL lidocaine 1 %; 3 mL bupivacaine 0.5 %; 40 mg methylPREDNISolone acetate 40 MG/ML Outcome: tolerated well, no immediate complications Consent was given by the patient. Patient was prepped and draped in the usual sterile fashion.   Large Joint Inj: bilateral knee on 08/24/2020 8:32 AM Indications: pain Details: 22 G needle  Arthrogram: No  Medications (Right): 2 mL lidocaine 1 %; 2 mL bupivacaine 0.5 %; 40 mg methylPREDNISolone acetate 40 MG/ML Medications (Left): 2 mL lidocaine 1 %; 2 mL bupivacaine 0.5 %; 40 mg methylPREDNISolone acetate 40 MG/ML Outcome: tolerated well, no immediate complications Patient was prepped and draped in the usual sterile fashion.       Clinical Data: No additional findings.   Subjective: Chief Complaint  Patient presents with  . Right Shoulder - Pain  . Left Knee - Pain  . Right Knee - Pain    Patient is  following up today for right shoulder pain bilateral knee pain.  We saw her about 10 to 11 months ago for these issues.  In the meantime she has undergone a duodenal switch and she has lost a significant amount of weight.  Her BMI is now down to 41.  She states that the prior cortisone injections helped for significant amount of time and would like to have them repeated.  Denies any injuries.  She has pain with raising her arm above her shoulder.  Knee pain is unchanged in characteristics.   Review of Systems  Constitutional: Negative.   HENT: Negative.   Eyes: Negative.   Respiratory: Negative.   Cardiovascular: Negative.   Endocrine: Negative.   Musculoskeletal: Negative.   Neurological: Negative.   Hematological: Negative.   Psychiatric/Behavioral: Negative.   All other systems reviewed and are negative.    Objective: Vital Signs: Ht 5\' 3"  (1.6 m)   Wt 235 lb (106.6 kg)   BMI 41.63 kg/m   Physical Exam Vitals and nursing note reviewed.  Constitutional:      Appearance: She is well-developed.  Pulmonary:     Effort: Pulmonary effort is normal.  Skin:    General: Skin is warm.     Capillary Refill: Capillary refill takes less than 2 seconds.  Neurological:     Mental Status:  She is alert and oriented to person, place, and time.  Psychiatric:        Behavior: Behavior normal.        Thought Content: Thought content normal.        Judgment: Judgment normal.     Ortho Exam Right shoulder shows full range of motion with moderate pain.  Good strength with moderate pain.  Negative Hawkins. Bilateral knee exams are unchanged. Specialty Comments:  No specialty comments available.  Imaging: No results found.   PMFS History: Patient Active Problem List   Diagnosis Date Noted  . Baker cyst, right 06/23/2019  . Vaginal itching 06/23/2019  . Mild intermittent asthma without complication 04/27/2019  . Depression 04/27/2019  . Allergies 04/27/2019  . Edema 04/27/2019  .  Insulin resistance 11/11/2018  . Heart murmur 10/25/2018  . 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 Obesity hypoventilation syndrome ([facility]) 12/18/2014  . Snoring 12/18/2014  . Asthma with acute exacerbation 12/18/2014  . Morbid obesity ([facility]) 11/17/2014  . HTN (hypertension) 11/17/2014  . Esophageal reflux 11/17/2014  . Bilateral edema of lower extremity 11/17/2014   Past Medical History:  Diagnosis Date  . Allergy   . Anemia    during pregnancy   . Arthritis   . Asthma   . Depression   . Edema    lower extremities   . GERD (gastroesophageal reflux disease)   . Headache    occasional migraine   . Heart murmur   . Hypertension   . Morbid obesity ([facility])   . Osteoarthritis   . PONV (postoperative nausea and vomiting)   . Postpartum care following cesarean delivery (12/2) 11/01/2013  . Prediabetes   . Shortness of breath dyspnea    only asthma related   . Sleep apnea    not on CPAP, dx 01/2013 with Outpatietn overnight study at home.   . Swelling     Family History  Problem Relation Age of Onset  . Diabetes Mother   . Hypertension Mother   . Depression Mother   . Anxiety disorder Mother   . Sleep apnea Mother   . Obesity Mother   . Diabetes Father   . Hypertension Father   . Hyperlipidemia Father   . Heart disease Father        cardiomegaly, CHF  . Anxiety disorder Father   . Obesity Father   . Cancer Father 20       lung cancer  . Mental illness Father   . Hypertension Brother   . Diabetes Brother   . Hypertension Maternal Grandmother   . Hypertension Maternal Grandfather   . Hypertension Sister     Past Surgical History:  Procedure Laterality Date  . BREATH TEK H PYLORI N/A 02/22/2015   Procedure: BREATH TEK H PYLORI;  Surgeon: Luretha Murphy, MD;  Location: [facility];  Service: General;  Laterality: N/A;  . CESAREAN SECTION    . CESAREAN SECTION N/A 11/01/2013   Procedure: REPEAT CESAREAN SECTION;  Surgeon: Serita Kyle, MD;   Location: [facility];  Service: Obstetrics;  Laterality: N/A;  EDD: 11/04/13  . LAPAROSCOPIC GASTRIC SLEEVE RESECTION N/A 06/25/2015   Procedure: LAPAROSCOPIC GASTRIC SLEEVE RESECTION;  Surgeon: Luretha Murphy, MD;  Location: [facility];  Service: General;  Laterality: N/A;  . WISDOM TOOTH EXTRACTION     Social History   Occupational History  . 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 Smoking status: Never Smoker  . Smokeless tobacco: Never Used  Vaping Use  . Vaping Use: Never used  Substance and Sexual Activity  . Alcohol use: Yes    Alcohol/week: 7.0 standard drinks    Types: 7 Glasses of wine per week  . Drug use: No  . Sexual activity: Yes    Birth control/protection: I.U.D.

## 2020-09-13 ENCOUNTER — Ambulatory Visit: Payer: BC Managed Care – PPO | Attending: Orthopaedic Surgery

## 2020-11-28 ENCOUNTER — Other Ambulatory Visit: Payer: Self-pay | Admitting: Family Medicine

## 2020-11-28 DIAGNOSIS — Z1231 Encounter for screening mammogram for malignant neoplasm of breast: Secondary | ICD-10-CM

## 2020-12-12 ENCOUNTER — Other Ambulatory Visit: Payer: Self-pay | Admitting: *Deleted

## 2020-12-12 DIAGNOSIS — R609 Edema, unspecified: Secondary | ICD-10-CM

## 2020-12-25 ENCOUNTER — Encounter (HOSPITAL_COMMUNITY): Payer: BC Managed Care – PPO

## 2020-12-27 ENCOUNTER — Encounter (HOSPITAL_COMMUNITY): Payer: Self-pay

## 2021-01-07 ENCOUNTER — Ambulatory Visit: Payer: BC Managed Care – PPO

## 2021-01-25 ENCOUNTER — Ambulatory Visit (INDEPENDENT_AMBULATORY_CARE_PROVIDER_SITE_OTHER): Payer: BC Managed Care – PPO | Admitting: Physician Assistant

## 2021-01-25 ENCOUNTER — Encounter: Payer: Self-pay | Admitting: Physician Assistant

## 2021-01-25 ENCOUNTER — Ambulatory Visit (HOSPITAL_COMMUNITY)
Admission: RE | Admit: 2021-01-25 | Discharge: 2021-01-25 | Disposition: A | Discharge status: Home / Self Care | Payer: BC Managed Care – PPO | Source: Ambulatory Visit | Attending: Vascular Surgery | Admitting: Vascular Surgery

## 2021-01-25 ENCOUNTER — Other Ambulatory Visit: Payer: Self-pay

## 2021-01-25 VITALS — BP 115/77 | HR 62 | Temp 98.4°F | Resp 20 | Ht 63.0 in | Wt 229.0 lb

## 2021-01-25 DIAGNOSIS — R609 Edema, unspecified: Secondary | ICD-10-CM | POA: Diagnosis present

## 2021-01-25 DIAGNOSIS — K838 Other specified diseases of biliary tract: Secondary | ICD-10-CM | POA: Insufficient documentation

## 2021-01-25 DIAGNOSIS — I872 Venous insufficiency (chronic) (peripheral): Secondary | ICD-10-CM | POA: Diagnosis not present

## 2021-01-25 NOTE — Progress Notes (Signed)
Requested by:  Doristine Bosworth, MD [phone] [address] [place],  [place] [address]  Reason for consultation: bilateral lower extremity swelling and varicose veins   History of Present Illness   Amber Daniels is a 40 y.o. (1980-12-19) female who presents for evaluation of bilateral lower extremity swelling and varicose veins. She says this initially started after she had her daughter in 2014. The left leg has always had more swelling than the right. She said they did get better for a while and then she had Gastric Sleeve surgery in 2016 and since then it worsened. The swelling is the most bothersome to her. She does have some aching in her legs if she is up on them for prolonged period of time. She also complains of warmth overlying the large varicose veins and tenderness. Occasionally she also has itching in her left calf more so than right. She has been wearing knee high 20-30 mmHg compression stockings that were recommended by her PCP for about 1 year. She says they somewhat help with the swelling but she does not wear them every day, usually just while she is at work. She does not elevate her legs. 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 She has no personal history of DVT. She does report that her grandmother had a blood clot that traveled from her leg to her lungs and heart and ended up killing her. She also reports that her brother is on chronic anticoagulation for DVT. She has never been tested for any hereditary clotting disorders. She also has family history of venous disease.   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 Grandmother died from DVT that went to her lungs and heart and   Past Medical History:  Diagnosis Date  . Allergy   . Anemia    during pregnancy   . Arthritis   . Asthma   . Depression   . Edema    lower extremities   . GERD (gastroesophageal reflux disease)   . Headache    occasional migraine   . Heart murmur   . Hypertension   . Morbid obesity (HCC)   . Osteoarthritis   . PONV (postoperative nausea and vomiting)   . Postpartum care following cesarean delivery (12/2) 11/01/2013  . Prediabetes   . Shortness of breath dyspnea    only asthma related   . Sleep apnea    not on CPAP, dx 01/2013 with Outpatietn overnight study at home.   . Swelling     Past Surgical History:  Procedure Laterality Date  . BREATH TEK H PYLORI N/A 02/22/2015   Procedure: BREATH TEK H PYLORI;  Surgeon: Luretha Murphy, MD;  Location: [facility];  Service: General;  Laterality: N/A;  . CESAREAN SECTION    . CESAREAN SECTION N/A 11/01/2013   Procedure: REPEAT CESAREAN SECTION;  Surgeon: Serita Kyle, MD;  Location: [facility];  Service: Obstetrics;  Laterality: N/A;  EDD: 11/04/13  . LAPAROSCOPIC GASTRIC SLEEVE RESECTION N/A 06/25/2015   Procedure: LAPAROSCOPIC GASTRIC SLEEVE RESECTION;  Surgeon: Luretha Murphy, MD;  Location: [facility];  Service: General;  Laterality: N/A;  . WISDOM TOOTH EXTRACTION      Social History   Socioeconomic History  . Marital status: Single    Spouse name: Not on file  . Number of children: 2  . Years of education: Not on file  . Highest education level: Not on file  Occupational History  . 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 Smoking status: Never Smoker  . Smokeless tobacco: Never Used  Vaping Use  . Vaping Use: Never used  Substance and Sexual Activity  . Alcohol use: Yes    Alcohol/week: 7.0 standard drinks    Types: 7 Glasses of wine per week  . Drug use: No  . Sexual activity: Yes     Birth control/protection: I.U.D.  Other Topics Concern  . Not on file  Social History Narrative   Caffeine none.  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     Social Determinants of Health   Financial Resource Strain: Not on file  Food Insecurity: Not on file  Transportation Needs: Not on file  Physical Activity: Not on file  Stress: Not on file  Social Connections: Not on file  Intimate Partner Violence: Not on file   Family History  Problem Relation Age of Onset  . Diabetes Mother   . Hypertension Mother   . Depression Mother   . Anxiety disorder Mother   . Sleep apnea Mother   . Obesity Mother   . Diabetes Father   . Hypertension Father   . Hyperlipidemia Father   . Heart disease Father        cardiomegaly, CHF  . Anxiety disorder Father   . Obesity Father   . Cancer Father 90       lung cancer  . Mental illness Father   . Hypertension Brother   . Diabetes Brother   . Hypertension Maternal Grandmother   . Hypertension Maternal Grandfather   . Hypertension Sister     Current Outpatient Medications  Medication Sig Dispense Refill  . acetaminophen (TYLENOL) 500 MG tablet Take 1,500 mg by mouth every 6 (six) hours as needed. For pain.    Marland Kitchen albuterol (VENTOLIN HFA) 108 (90 Base) MCG/ACT inhaler Inhale 2 puffs into the lungs every 6 (six) hours as needed for wheezing or shortness of breath. 18 g 5  . B Complex Vitamins (VITAMIN B-COMPLEX) TABS Take by mouth.    . beclomethasone (QVAR) 40 MCG/ACT inhaler Inhale 2 puffs into the lungs daily. 1 Inhaler 1  . cetirizine (ZYRTEC) 10 MG tablet Take 10 mg by mouth daily.    . citalopram (CELEXA) 40 MG tablet Take 0.5 tablets (20 mg total) by mouth daily. 30 tablet 1  . fexofenadine (ALLEGRA) 180 MG tablet Take 180 mg by mouth daily.    . fluconazole (DIFLUCAN) 150 MG tablet Take 1 tablet (150 mg total) by mouth daily as needed (take at least 3 days between doses as needed for vaginal irritaiton). 10 tablet 0  .  Fluocinolone Acetonide Scalp 0.01 % OIL Apply topically to affected areas 120 mL 1  . furosemide (LASIX) 20 MG tablet Take 1 tablet (20 mg total) by mouth daily as needed. 30 tablet 0  . HYDROcodone-acetaminophen (HYCET) 7.5-325 mg/15 ml solution TAKE 15 ML (7.5 MG OF HYDROCODONE TOTAL) BY MOUTH EVERY 4 (FOUR) HOURS AS NEEDED FOR UP TO 7 DAYS.    Marland Kitchen ketoconazole (NIZORAL) 2 % shampoo Apply 1 application topically 2 (two) times a week. 120 mL 1  .  levocetirizine (XYZAL) 5 MG tablet Take 5 mg by mouth every evening.    . metroNIDAZOLE (FLAGYL) 500 MG tablet Take 1 tablet (500 mg total) by mouth 2 (two) times daily. 14 tablet 0  . metroNIDAZOLE (METROGEL) 0.75 % gel Apply 1 application topically 2 (two) times daily.    . montelukast (SINGULAIR) 10 MG tablet Take 1 tablet (10 mg total) by mouth at bedtime. 90 tablet 1  . omeprazole (PRILOSEC) 20 MG capsule Take 1 capsule (20 mg total) daily by mouth. 30 capsule 3  . ondansetron (ZOFRAN-ODT) 4 MG disintegrating tablet DISSOLVE 1 TABLET ON THE TONGUE EVERY 8 (EIGHT) HOURS AS NEEDED FOR NAUSEA FOR UP TO 7 DAYS.    . Pancrelipase, Lip-Prot-Amyl, (CREON PO) Take by mouth daily.    . pseudoephedrine (SUDAFED 12 HOUR) 120 MG 12 hr tablet Take 1 tablet (120 mg total) by mouth 2 (two) times daily. 30 tablet 3  . triamcinolone (NASACORT) 55 MCG/ACT AERO nasal inhaler Place 2 sprays into the nose daily.    . Vitamin D, Ergocalciferol, (DRISDOL) 1.25 MG (50000 UNIT) CAPS capsule TAKE 1 CAPSULE (50,000 UNITS TOTAL) BY MOUTH EVERY 7 (SEVEN) DAYS. 12 capsule 1  . ELIQUIS 2.5 MG TABS tablet Take 2.5 mg by mouth 2 (two) times daily. (Patient not taking: Reported on 01/25/2021)    . pantoprazole (PROTONIX) 40 MG tablet Take 40 mg by mouth daily. (Patient not taking: Reported on 01/25/2021)    . sucralfate (CARAFATE) 1 g tablet Take 1 tablet (1 g total) by mouth daily as needed. 240 tablet 0   No current facility-administered medications for this visit.    Allergies   Allergen Reactions  . Bee Venom   . Other     Seasonal and enviromental allergies (dust)    REVIEW OF SYSTEMS (negative unless checked):   Cardiac:  []  Chest pain or chest pressure? []  Shortness of breath upon activity? []  Shortness of breath when lying flat? []  Irregular heart rhythm?  Vascular:  []  Pain in calf, thigh, or hip brought on by walking? []  Pain in feet at night that wakes you up from your sleep? []  Blood clot in your veins? []  Leg swelling?  Pulmonary:  []  Oxygen at home? []  Productive cough? []  Wheezing?  Neurologic:  []  Sudden weakness in arms or legs? []  Sudden numbness in arms or legs? []  Sudden onset of difficult speaking or slurred speech? []  Temporary loss of vision in one eye? []  Problems with dizziness?  Gastrointestinal:  []  Blood in stool? []  Vomited blood?  Genitourinary:  []  Burning when urinating? []  Blood in urine?  Psychiatric:  []  Major depression  Hematologic:  []  Bleeding problems? []  Problems with blood clotting?  Dermatologic:  []  Rashes or ulcers?  Constitutional:  []  Fever or chills?  Ear/Nose/Throat:  []  Change in hearing? []  Nose bleeds? []  Sore throat?  Musculoskeletal:  []  Back pain? []  Joint pain? []  Muscle pain?   Physical Examination     Vitals:   01/25/21 1213  BP: 115/77  Pulse: 62  Resp: 20  Temp: 98.4 F (36.9 C)  TempSrc: Temporal  SpO2: 100%  Weight: 229 lb (103.9 kg)  Height: 5\' 3"  (1.6 m)   Body mass index is 40.57 kg/m.  General:  WDWN in NAD; vital signs documented above Gait: Normal HENT: WNL, normocephalic Pulmonary: normal non-labored breathing , without wheezing Cardiac: regular HR, without  Murmurs without carotid bruit Abdomen: obese, soft, NT, no masses Vascular Exam/Pulses:2+ radial pulses, 2+ DP  and PT pulses bilaterally Extremities: with varicose veins, with reticular veins, without edema, without stasis pigmentation, without lipodermatosclerosis, without  ulcers Musculoskeletal: no muscle wasting or atrophy  Neurologic: A&O X 3;  No focal weakness or paresthesias are detected Psychiatric:  The pt has Normal affect.  Non-invasive Vascular Imaging   BLE Venous Insufficiency Duplex (01/25/21):   RLE:   No DVT and SVT   No GSV reflux 0.29-0.50  GSV diameter   No SSV reflux   No deep venous reflux  Anterior accessory vein with relfux   LLE:  No DVT and SVT  GSV reflux proximal thigh, proximal calf  GSV diameter 0.20-0.57  No SSV reflux   No deep venous reflux  Anterior accessory vein with reflux   Medical Decision Making   Amber Daniels is a 40 y.o. female who presents with: BLE chronic venous insufficiency with swelling and large superficial anterior accessory veins. Her duplex shows no DVT or SVT. She does not have any deep venous reflux. She has bilaterally large and incompetent anterior accessory veins which are visible easily on physical exam. She does have some GSV reflux on the left lower extremity in the proximal thigh and calf otherwise her veins have normal flow   Based on the patient's history and examination, I recommend continue conservative therapy with elevation, compression stockings, weight reduction, exercise, and avoidance of prolonged sitting or standing  She is not a candidate for ablation but I think she could be good candidate for stab phlebectomy of her anterior accessory veins  I will have her follow up with Dr. Edilia Boickson or Dr. Darrick PennaFields over the next several weeks to discuss her options further    Amber Congressorrina Edwin Baines, PA-C [facility] Office: [phone]  01/25/2021, 1:03 PM  Clinic MD: Dr. Randie Heinzain

## 2021-01-31 ENCOUNTER — Ambulatory Visit: Payer: BC Managed Care – PPO | Admitting: Vascular Surgery

## 2021-01-31 ENCOUNTER — Other Ambulatory Visit: Payer: Self-pay

## 2021-01-31 ENCOUNTER — Encounter: Payer: Self-pay | Admitting: Vascular Surgery

## 2021-01-31 VITALS — BP 118/82 | HR 80 | Temp 98.2°F | Resp 20 | Ht 63.0 in | Wt 229.0 lb

## 2021-01-31 DIAGNOSIS — I83813 Varicose veins of bilateral lower extremities with pain: Secondary | ICD-10-CM

## 2021-01-31 HISTORY — PX: OTHER SURGICAL HISTORY: SHX169

## 2021-01-31 NOTE — Progress Notes (Signed)
Patient name: Amber Daniels MRN: [id number] DOB: 16-Apr-1981 Sex: female  REASON FOR CONSULT: Symptomatic varicose veins with pain and itching  HPI: Amber Daniels is a 40 y.o. female, with a longstanding history of bilateral lower extremity varicose veins.  These are primarily concentrated in the thigh on both sides.  She was seen by our APP clinic couple of weeks ago and referred to me for consideration of stab avulsions of multiple varicosities.  Patient states that she has some burning and stinging over the varicosities and itching that occurs frequently.  She has been wearing compression stockings for the last year as documented by her primary care physician Dr. Creta Levin from March 28, 2020.  She states that this has not completely relieved her symptoms.  He has no prior history of DVT.  She has lost about 100 pounds after having a gastric diverting procedure.  Other medical problems include hypertension, obesity, arthritis, prediabetes, sleep apnea.  These are all currently stable.  Eliquis is listed as one of her medications but states she is no longer taking this.  Past Medical History:  Diagnosis Date  . Allergy   . Anemia    during pregnancy   . Arthritis   . Asthma   . Depression   . Edema    lower extremities   . GERD (gastroesophageal reflux disease)   . Headache    occasional migraine   . Heart murmur   . Hypertension   . Morbid obesity (HCC)   . Osteoarthritis   . PONV (postoperative nausea and vomiting)   . Postpartum care following cesarean delivery (12/2) 11/01/2013  . Prediabetes   . Shortness of breath dyspnea    only asthma related   . Sleep apnea    not on CPAP, dx 01/2013 with Outpatietn overnight study at home.   . Swelling    Past Surgical History:  Procedure Laterality Date  . BREATH TEK H PYLORI N/A 02/22/2015   Procedure: BREATH TEK H PYLORI;  Surgeon: Luretha Murphy, MD;  Location: [facility];  Service: General;  Laterality: N/A;  . CESAREAN  SECTION    . CESAREAN SECTION N/A 11/01/2013   Procedure: REPEAT CESAREAN SECTION;  Surgeon: Serita Kyle, MD;  Location: [facility];  Service: Obstetrics;  Laterality: N/A;  EDD: 11/04/13  . LAPAROSCOPIC GASTRIC SLEEVE RESECTION N/A 06/25/2015   Procedure: LAPAROSCOPIC GASTRIC SLEEVE RESECTION;  Surgeon: Luretha Murphy, MD;  Location: [facility];  Service: General;  Laterality: N/A;  . WISDOM TOOTH EXTRACTION      Family History  Problem Relation Age of Onset  . Diabetes Mother   . Hypertension Mother   . Depression Mother   . Anxiety disorder Mother   . Sleep apnea Mother   . Obesity Mother   . Diabetes Father   . Hypertension Father   . Hyperlipidemia Father   . Heart disease Father        cardiomegaly, CHF  . Anxiety disorder Father   . Obesity Father   . Cancer Father 59       lung cancer  . Mental illness Father   . Hypertension Brother   . Diabetes Brother   . Hypertension Maternal Grandmother   . Hypertension Maternal Grandfather   . Hypertension Sister     SOCIAL HISTORY: Social History   Socioeconomic History  . Marital status: Single    Spouse name: Not on file  . Number of children: 2  . Years of education: Not on file  .  Highest education level: Not on file  Occupational History  . 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 Smoking status: Never Smoker  . Smokeless tobacco: Never Used  Vaping Use  . Vaping Use: Never used  Substance and Sexual Activity  . Alcohol use: Yes    Alcohol/week: 7.0 standard drinks    Types: 7 Glasses of wine per week  . Drug use: No  . Sexual activity: Yes    Birth control/protection: I.U.D.  Other Topics Concern  . Not on file  Social History Narrative   Caffeine none.  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     Social Determinants of Health   Financial Resource Strain: Not on file  Food Insecurity: Not on file  Transportation Needs: Not on file  Physical Activity: Not on file  Stress: Not on file   Social Connections: Not on file  Intimate Partner Violence: Not on file    Allergies  Allergen Reactions  . Bee Venom   . Other     Seasonal and enviromental allergies (dust)    Current Outpatient Medications  Medication Sig Dispense Refill  . acetaminophen (TYLENOL) 500 MG tablet Take 1,500 mg by mouth every 6 (six) hours as needed. For pain.    Marland Kitchen albuterol (VENTOLIN HFA) 108 (90 Base) MCG/ACT inhaler Inhale 2 puffs into the lungs every 6 (six) hours as needed for wheezing or shortness of breath. 18 g 5  . B Complex Vitamins (VITAMIN B-COMPLEX) TABS Take by mouth.    . beclomethasone (QVAR) 40 MCG/ACT inhaler Inhale 2 puffs into the lungs daily. 1 Inhaler 1  . cetirizine (ZYRTEC) 10 MG tablet Take 10 mg by mouth daily.    . citalopram (CELEXA) 40 MG tablet Take 0.5 tablets (20 mg total) by mouth daily. 30 tablet 1  . fexofenadine (ALLEGRA) 180 MG tablet Take 180 mg by mouth daily.    . fluconazole (DIFLUCAN) 150 MG tablet Take 1 tablet (150 mg total) by mouth daily as needed (take at least 3 days between doses as needed for vaginal irritaiton). 10 tablet 0  . Fluocinolone Acetonide Scalp 0.01 % OIL Apply topically to affected areas 120 mL 1  . furosemide (LASIX) 20 MG tablet Take 1 tablet (20 mg total) by mouth daily as needed. 30 tablet 0  . HYDROcodone-acetaminophen (HYCET) 7.5-325 mg/15 ml solution TAKE 15 ML (7.5 MG OF HYDROCODONE TOTAL) BY MOUTH EVERY 4 (FOUR) HOURS AS NEEDED FOR UP TO 7 DAYS.    Marland Kitchen ketoconazole (NIZORAL) 2 % shampoo Apply 1 application topically 2 (two) times a week. 120 mL 1  . levocetirizine (XYZAL) 5 MG tablet Take 5 mg by mouth every evening.    . metroNIDAZOLE (FLAGYL) 500 MG tablet Take 1 tablet (500 mg total) by mouth 2 (two) times daily. 14 tablet 0  . metroNIDAZOLE (METROGEL) 0.75 % gel Apply 1 application topically 2 (two) times daily.    . montelukast (SINGULAIR) 10 MG tablet Take 1 tablet (10 mg total) by mouth at bedtime. 90 tablet 1  . omeprazole  (PRILOSEC) 20 MG capsule Take 1 capsule (20 mg total) daily by mouth. 30 capsule 3  . ondansetron (ZOFRAN-ODT) 4 MG disintegrating tablet DISSOLVE 1 TABLET ON THE TONGUE EVERY 8 (EIGHT) HOURS AS NEEDED FOR NAUSEA FOR UP TO 7 DAYS.    . Pancrelipase, Lip-Prot-Amyl, (CREON PO) Take by mouth daily.    . pantoprazole (PROTONIX) 40 MG tablet Take 40 mg by mouth daily.    Marland Kitchen  pseudoephedrine (SUDAFED 12 HOUR) 120 MG 12 hr tablet Take 1 tablet (120 mg total) by mouth 2 (two) times daily. 30 tablet 3  . triamcinolone (NASACORT) 55 MCG/ACT AERO nasal inhaler Place 2 sprays into the nose daily.    . Vitamin D, Ergocalciferol, (DRISDOL) 1.25 MG (50000 UNIT) CAPS capsule TAKE 1 CAPSULE (50,000 UNITS TOTAL) BY MOUTH EVERY 7 (SEVEN) DAYS. 12 capsule 1  . ELIQUIS 2.5 MG TABS tablet Take 2.5 mg by mouth 2 (two) times daily. (Patient not taking: Reported on 01/31/2021)    . sucralfate (CARAFATE) 1 g tablet Take 1 tablet (1 g total) by mouth daily as needed. 240 tablet 0   No current facility-administered medications for this visit.    ROS:   General:  No weight loss, Fever, chills  HEENT: No recent headaches, no nasal bleeding, no visual changes, no sore throat  Neurologic: No dizziness, blackouts, seizures. No recent symptoms of stroke or mini- stroke. No recent episodes of slurred speech, or temporary blindness.  Cardiac: No recent episodes of chest pain/pressure, no shortness of breath at rest.  No shortness of breath with exertion.  Denies history of atrial fibrillation or irregular heartbeat  Vascular: No history of rest pain in feet.  No history of claudication.  No history of non-healing ulcer, No history of DVT   Pulmonary: No home oxygen, no productive cough, no hemoptysis,  No asthma or wheezing  Musculoskeletal:  [X]  Arthritis, [ ]  Low back pain,  [X]  Joint pain  Hematologic:No history of hypercoagulable state.  No history of easy bleeding.  No history of anemia  Gastrointestinal: No  hematochezia or melena,  No gastroesophageal reflux, no trouble swallowing  Urinary: [ ]  chronic Kidney disease, [ ]  on HD - [ ]  MWF or [ ]  TTHS, [ ]  Burning with urination, [ ]  Frequent urination, [ ]  Difficulty urinating;   Skin: No rashes  Psychological: No history of anxiety,  No history of depression   Physical Examination  Vitals:   01/31/21 1600  BP: 118/82  Pulse: 80  Resp: 20  Temp: 98.2 F (36.8 C)  SpO2: 98%  Weight: 229 lb (103.9 kg)  Height: 5\' 3"  (1.6 m)    Body mass index is 40.57 kg/m.  General:  Alert and oriented, no acute distress HEENT: Normal Neck: No bruit or JVD Pulmonary: Clear to auscultation bilaterally Cardiac: Regular Rate and Rhythm without murmur Abdomen: Soft, non-tender, non-distended, no mass, no scars Skin: No rash Extremity Pulses:  2+ radial, brachial, femoral, dorsalis pedis, posterior tibial pulses bilaterally Musculoskeletal: No deformity or edema  Neurologic: Upper and lower extremity motor 5/5 and symmetric          DATA:  Patient had a venous duplex from January 25, 2021.  This showed no reflux in her right greater saphenous lesser saphenous or deep system.  She did have an anterior accessory branch that was 8 mm in diameter that did have reflux.  On the left side she also had an anterior accessory branch that had reflux with an 11 mm diameter.  She did have some segments of reflux in her left greater saphenous vein with about a 4 mm diameter.  I used the SonoSite ultrasound to review her veins today and the left accessory saphenous was about 6 mm in diameter on my exam.  The right side was similar.  Greater saphenous vein was about 4 mm in diameter.  ASSESSMENT: Symptomatic varicose veins with pain and itching.  This has not completely  resolved with compression stockings.  Patient has primarily reflux and a large accessory saphenous bilaterally.   PLAN: I offered the patient laser ablation of the accessory saphenous of  both legs and greater than 20 stab avulsions of both legs in the near future.  We will call her pending insurance approval.  Risk benefits possible complications and procedure details were discussed today including not limited to bleeding infection DVT risk of 100,000.  She understands and agrees to proceed.  We will call her in the near future.  Fabienne Bruns, MD [facility] [place] Office: [phone]

## 2021-02-05 ENCOUNTER — Encounter: Payer: Self-pay | Admitting: Vascular Surgery

## 2021-02-06 ENCOUNTER — Other Ambulatory Visit: Payer: Self-pay | Admitting: *Deleted

## 2021-02-06 DIAGNOSIS — I83813 Varicose veins of bilateral lower extremities with pain: Secondary | ICD-10-CM

## 2021-02-19 ENCOUNTER — Ambulatory Visit
Admission: RE | Admit: 2021-02-19 | Discharge: 2021-02-19 | Disposition: A | Discharge status: Home / Self Care | Payer: BC Managed Care – PPO | Source: Ambulatory Visit | Attending: Family Medicine | Admitting: Family Medicine

## 2021-02-19 ENCOUNTER — Other Ambulatory Visit: Payer: Self-pay

## 2021-02-19 DIAGNOSIS — Z1231 Encounter for screening mammogram for malignant neoplasm of breast: Secondary | ICD-10-CM

## 2021-02-21 ENCOUNTER — Other Ambulatory Visit: Payer: Self-pay | Admitting: *Deleted

## 2021-02-21 MED ORDER — LORAZEPAM 1 MG PO TABS: ORAL_TABLET | ORAL | 0 refills | Status: AC

## 2021-02-27 ENCOUNTER — Telehealth: Payer: Self-pay | Admitting: Surgery

## 2021-02-27 ENCOUNTER — Ambulatory Visit: Payer: BC Managed Care – PPO | Admitting: Vascular Surgery

## 2021-02-27 ENCOUNTER — Encounter: Payer: Self-pay | Admitting: Vascular Surgery

## 2021-02-27 ENCOUNTER — Other Ambulatory Visit: Payer: Self-pay

## 2021-02-27 VITALS — BP 122/82 | HR 68 | Temp 98.2°F | Resp 16 | Ht 63.0 in | Wt 229.0 lb

## 2021-02-27 DIAGNOSIS — I83812 Varicose veins of left lower extremities with pain: Secondary | ICD-10-CM | POA: Diagnosis not present

## 2021-02-27 NOTE — Telephone Encounter (Signed)
Patient called with bleeding from her stabs today.  I told her that she needs to keep the ;leg elevated and wrap it with an ACE bandage.  She is going to call in the am to come be evaluated.  If it gets worse, she will come to the ER  [facility] [place]

## 2021-02-27 NOTE — Progress Notes (Signed)
     Laser Ablation Procedure    Date: 02/27/2021   Amber Daniels DOB:1981/05/03  Consent signed: Yes      Surgeon: Fabienne Bruns MD   Procedure: Laser Ablation: left Greater Saphenous Vein (anterior accessory branch)  Ultrasound was used to identify the patient's left greater saphenous accessory vein.  This was fairly deep due to the patient's body habitus.  Local anesthesia was infiltrated over the vein about 7 cm below the left groin crease.  I was able to initially cannulate the vein but was basically at the hub of the needle and after attempting to advance the guidewire we lost access.  I made several other attempts to reestablish access but due to spasm in the vein and basically having the needle hub to access the vein I aborted attempts to cannulate this.  I felt that since this was only a very short segment of about 7 cm of vein the patient would achieve just as much benefit with stab avulsions so we proceeded to this portion of the procedure.  BP 122/82 (BP Location: Left Arm, Patient Position: Sitting, Cuff Size: Large)   Pulse 68   Temp 98.2 F (36.8 C) (Temporal)   Resp 16   Ht 5\' 3"  (1.6 m)   Wt 229 lb (103.9 kg)   SpO2 100%   BMI 40.57 kg/m   Tumescent Anesthesia: 475 cc 0.9% NaCl with 50 cc Lidocaine HCL 1%  and 15 cc 8.4% NaHCO3  Local Anesthesia: 30 cc Lidocaine HCL and NaHCO3 (ratio 2:1)     Stab Phlebectomy: >20 Incisions  Sites: Thigh and Calf  Patient tolerated procedure well  Notes: Patient wore face mask.  All staff members wore facial masks and facial shields/goggles.    Description of Procedure:  After marking the course of the secondary varicosities, the patient was placed on the operating table in the supine position, and the left leg was prepped and draped in sterile fashion.   Local anesthetic was administered. Under ultrasound guidance, the saphenous vein was unable to be accessed with a micro needle.  Endovenous laser ablation not performed.       For stab phlebectomies, local anesthetic was administered at the previously marked varicosities, and tumescent anesthesia was administered around the vessels.  Greater than 20 stab wounds were made using the tip of an 11 blade. And using the vein hook, the phlebectomies were performed using a hemostat to avulse the varicosities.  Adequate hemostasis was achieved.     Steri strips were applied to the stab wounds and ABD pads and thigh high compression stockings were applied.  Ace wrap bandages were applied over the phlebectomy sites. Blood loss was less than 15 cc.  Discharge instructions reviewed with patient and hardcopy of discharge instructions given to patient to take home. The patient ambulated out of the operating room having tolerated the procedure well.  , MD [facility] Office: [phone]

## 2021-03-06 ENCOUNTER — Encounter (HOSPITAL_COMMUNITY): Payer: BC Managed Care – PPO

## 2021-03-06 ENCOUNTER — Ambulatory Visit (INDEPENDENT_AMBULATORY_CARE_PROVIDER_SITE_OTHER): Payer: BC Managed Care – PPO | Admitting: Vascular Surgery

## 2021-03-06 ENCOUNTER — Ambulatory Visit: Payer: BC Managed Care – PPO | Admitting: Vascular Surgery

## 2021-03-06 ENCOUNTER — Encounter: Payer: Self-pay | Admitting: Vascular Surgery

## 2021-03-06 ENCOUNTER — Other Ambulatory Visit: Payer: Self-pay

## 2021-03-06 VITALS — BP 120/80 | HR 69 | Temp 98.2°F | Resp 16 | Ht 63.0 in | Wt 229.0 lb

## 2021-03-06 DIAGNOSIS — I83811 Varicose veins of right lower extremities with pain: Secondary | ICD-10-CM

## 2021-03-06 NOTE — Progress Notes (Signed)
Patient is a 40 year old female who returns for postoperative follow-up today.  She underwent an attempted ablation of an accessory saphenous last week.  She also had multiple stab avulsions.  The laser ablation was unsuccessful due to the depth of her vein.  She reports that she did have some bleeding from her stab avulsion sites the night of her operation.  This was mainly in anterior left thigh.  This was controlled with elevation and pressure.  She still has an area that is slightly erythematous and warm.  She has no shortness of breath or chest pain.  She is planning a trip to the beach this weekend.  She is wearing her compression stocking.  Physical exam:  Vitals:   03/06/21 0817  BP: 120/80  Pulse: 69  Resp: 16  Temp: 98.2 F (36.8 C)  TempSrc: Temporal  SpO2: 100%  Weight: 229 lb (103.9 kg)  Height: 5\' 3"  (1.6 m)    Extremities: Multiple stab avulsion sites primarily in the left thigh.  All of these are healing well.  There is an area on the left anterior thigh that is about 5 cm in circumference slightly erythematous and warm.  Is not fluctuant.  There is some firmness underneath this.  Assessment: Doing well status post multiple stab avulsions left leg.  No hematoma left anterior thigh  Plan: Patient will continue to treat the area on her thigh with ice packs for now.  I do not believe this is infected.  She has some problems with varicose veins and pain in the right leg.  She will call in the summertime to arrange for an intervention for this if he wishes to proceed.  [place], MD [facility] Office: [phone]

## 2021-04-17 ENCOUNTER — Institutional Professional Consult (permissible substitution): Payer: BC Managed Care – PPO | Admitting: Plastic Surgery

## 2021-12-04 ENCOUNTER — Other Ambulatory Visit: Payer: Self-pay

## 2021-12-04 ENCOUNTER — Encounter (HOSPITAL_BASED_OUTPATIENT_CLINIC_OR_DEPARTMENT_OTHER): Payer: Self-pay

## 2021-12-04 DIAGNOSIS — R059 Cough, unspecified: Secondary | ICD-10-CM | POA: Diagnosis present

## 2021-12-04 DIAGNOSIS — J45909 Unspecified asthma, uncomplicated: Secondary | ICD-10-CM | POA: Diagnosis not present

## 2021-12-04 DIAGNOSIS — R7303 Prediabetes: Secondary | ICD-10-CM | POA: Insufficient documentation

## 2021-12-04 DIAGNOSIS — J101 Influenza due to other identified influenza virus with other respiratory manifestations: Secondary | ICD-10-CM | POA: Insufficient documentation

## 2021-12-04 DIAGNOSIS — I10 Essential (primary) hypertension: Secondary | ICD-10-CM | POA: Insufficient documentation

## 2021-12-04 DIAGNOSIS — Z20822 Contact with and (suspected) exposure to covid-19: Secondary | ICD-10-CM | POA: Diagnosis not present

## 2021-12-04 DIAGNOSIS — Z7901 Long term (current) use of anticoagulants: Secondary | ICD-10-CM | POA: Diagnosis not present

## 2021-12-04 DIAGNOSIS — Z7952 Long term (current) use of systemic steroids: Secondary | ICD-10-CM | POA: Diagnosis not present

## 2021-12-04 LAB — RESP PANEL BY RT-PCR (FLU A&B, COVID) ARPGX2
Influenza A by PCR: POSITIVE — AB
Influenza B by PCR: NEGATIVE
SARS Coronavirus 2 by RT PCR: NEGATIVE

## 2021-12-04 NOTE — ED Triage Notes (Signed)
Pt c/o flu like sx x 4 days-NAD-steady gait 

## 2021-12-05 ENCOUNTER — Emergency Department (HOSPITAL_BASED_OUTPATIENT_CLINIC_OR_DEPARTMENT_OTHER)
Admission: EM | Admit: 2021-12-05 | Discharge: 2021-12-05 | Disposition: A | Discharge status: Home / Self Care | Payer: BC Managed Care – PPO | Attending: Emergency Medicine | Admitting: Emergency Medicine

## 2021-12-05 DIAGNOSIS — J101 Influenza due to other identified influenza virus with other respiratory manifestations: Secondary | ICD-10-CM

## 2021-12-05 MED ORDER — PREDNISONE 20 MG PO TABS
20.0000 mg | ORAL_TABLET | Freq: Once | ORAL | Status: AC
  Administered 2021-12-05: 20 mg via ORAL
  Filled 2021-12-05: qty 1

## 2021-12-05 MED ORDER — PREDNISONE 20 MG PO TABS: 20.0000 mg | ORAL_TABLET | Freq: Every day | ORAL | 0 refills | Status: AC

## 2021-12-05 MED ORDER — OSELTAMIVIR PHOSPHATE 75 MG PO CAPS: 75.0000 mg | ORAL_CAPSULE | Freq: Two times a day (BID) | ORAL | 0 refills | Status: DC

## 2021-12-05 NOTE — ED Provider Notes (Signed)
[facility] EMERGENCY DEPARTMENT Provider Note  CSN: [id number] Arrival date & time: 12/04/21 2235  Chief Complaint(s) Cough  HPI Amber Daniels is a 41 y.o. female   The history is provided by the patient.  Influenza Presenting symptoms: cough, fatigue, rhinorrhea, shortness of breath and sore throat   Severity:  Moderate Onset quality:  Gradual Duration:  2 days Progression:  Worsening Chronicity:  New Relieved by:  OTC medications Worsened by:  Nothing Associated symptoms: nasal congestion   Risk factors: no sick contacts   Risk factors comment:  Asthma  Past Medical History Past Medical History:  Diagnosis Date   Allergy    Anemia    during pregnancy    Arthritis    Asthma    Depression    Edema    lower extremities    GERD (gastroesophageal reflux disease)    Headache    occasional migraine    Heart murmur    Hypertension    Morbid obesity (HCC)    Osteoarthritis    PONV (postoperative nausea and vomiting)    Postpartum care following cesarean delivery (12/2) 11/01/2013   Prediabetes    Shortness of breath dyspnea    only asthma related    Sleep apnea    not on CPAP, dx 01/2013 with Outpatietn overnight study at home.    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 Authorizing Provider  oseltamivir (TAMIFLU) 75 MG capsule Take 1 capsule (75 mg total) by mouth every 12 (twelve) hours. 12/05/21  Yes Jatziry Wechter, Grayce Sessions, MD  predniSONE (DELTASONE) 20 MG tablet Take 1 tablet (20 mg total) by mouth daily for 5 days. 12/05/21 12/10/21 Yes Tamecka Milham, Grayce Sessions, MD  acetaminophen (TYLENOL) 500 MG tablet Take 1,500 mg by mouth every 6 (six) hours as needed. For pain.    [provider]  albuterol (VENTOLIN HFA) 108 (90 Base) MCG/ACT inhaler Inhale 2 puffs into the lungs every 6 (six) hours as needed for wheezing or shortness of breath. 05/06/19   Forrest Moron, MD  B Complex Vitamins (VITAMIN B-COMPLEX) TABS Take by mouth.    [provider]  beclomethasone (QVAR) 40 MCG/ACT inhaler Inhale 2 puffs into the lungs daily. 05/06/19   Forrest Moron, MD  cetirizine (ZYRTEC) 10 MG tablet Take 10 mg by mouth daily.    [provider]  citalopram (CELEXA) 40 MG tablet Take 0.5 tablets (20 mg total) by mouth daily. 06/21/19   Corum, Rex Kras, MD  ELIQUIS 2.5 MG TABS tablet Take 2.5 mg by mouth 2 (two) times daily. Patient not taking: Reported on 02/27/2021 08/20/19   [provider]  fexofenadine (  ALLEGRA) 180 MG tablet Take 180 mg by mouth daily.    [provider]  fluconazole (DIFLUCAN) 150 MG tablet Take 1 tablet (150 mg total) by mouth daily as needed (take at least 3 days between doses as needed for vaginal irritaiton). 06/21/19   Maryruth Hancock, MD  Fluocinolone Acetonide Scalp 0.01 % OIL Apply topically to affected areas 05/06/19   Delia Chimes A, MD  furosemide (LASIX) 20 MG tablet Take 1 tablet (20 mg total) by mouth daily as needed. 12/16/19   Forrest Moron, MD  HYDROcodone-acetaminophen (HYCET) 7.5-325 mg/15 ml solution TAKE 15 ML (7.5 MG OF  HYDROCODONE TOTAL) BY MOUTH EVERY 4 (FOUR) HOURS AS NEEDED FOR UP TO 7 DAYS. 08/17/19   [provider]  ketoconazole (NIZORAL) 2 % shampoo Apply 1 application topically 2 (two) times a week. 05/09/19   Forrest Moron, MD  levocetirizine (XYZAL) 5 MG tablet Take 5 mg by mouth every evening.    [provider]  LORazepam (ATIVAN) 1 MG tablet Take 1 tablet 30 minutes prior to leaving house on day of office surgery.  Bring second tablet with you to the office. Patient not taking: No sig reported 02/21/21   Elam Dutch, MD  metroNIDAZOLE (FLAGYL) 500 MG tablet Take 1 tablet (500 mg total) by mouth 2 (two) times daily. 12/16/19   Forrest Moron, MD  metroNIDAZOLE (METROGEL) 0.75 % gel Apply 1 application topically 2 (two) times daily.    [provider]  montelukast (SINGULAIR) 10 MG tablet Take 1 tablet (10 mg total) by mouth at bedtime. 05/06/19   Forrest Moron, MD  omeprazole (PRILOSEC) 20 MG capsule Take 1 capsule (20 mg total) daily by mouth. 10/07/17   Delia Chimes A, MD  ondansetron (ZOFRAN-ODT) 4 MG disintegrating tablet DISSOLVE 1 TABLET ON THE TONGUE EVERY 8 (EIGHT) HOURS AS NEEDED FOR NAUSEA FOR UP TO 7 DAYS. 08/17/19   [provider]  Pancrelipase, Lip-Prot-Amyl, (CREON PO) Take by mouth daily.    [provider]  pantoprazole (PROTONIX) 40 MG tablet Take 40 mg by mouth daily.    [provider]  pseudoephedrine (SUDAFED 12 HOUR) 120 MG 12 hr tablet Take 1 tablet (120 mg total) by mouth 2 (two) times daily. 02/03/18   Jaynee Eagles, PA-C  sucralfate (CARAFATE) 1 g tablet Take 1 tablet (1 g total) by mouth daily as needed. 12/16/19 02/14/20  Forrest Moron, MD  triamcinolone (NASACORT) 55 MCG/ACT AERO nasal inhaler Place 2 sprays into the nose daily.    [provider]  Vitamin D, Ergocalciferol, (DRISDOL) 1.25 MG (50000 UNIT) CAPS capsule TAKE 1 CAPSULE (50,000 UNITS TOTAL) BY MOUTH EVERY 7 (SEVEN) DAYS. 02/06/20   Forrest Moron, MD  Allergies Bee venom and Other  Review of Systems Review of Systems  Constitutional:  Positive for fatigue.  HENT:  Positive for congestion, rhinorrhea and sore throat.   Respiratory:  Positive for cough and shortness of breath.   As noted in HPI  Physical Exam Vital Signs  I have reviewed the triage vital signs BP 130/87 (BP Location: Left Arm)    Pulse 91    Temp 99.1 F (37.3 C) (Oral)    Resp 18    Ht 5\' 3"  (1.6 m)    Wt 104.8 kg    SpO2 100%    BMI 40.92 kg/m   Physical Exam Vitals reviewed.  Constitutional:      General: She is not in acute distress.    Appearance: She is well-developed. She is not diaphoretic.  HENT:     Head: Normocephalic and atraumatic.     Nose: Nose normal. No congestion.     Mouth/Throat:     Comments: Post nasal drip  Eyes:     General: No scleral icterus.       Right eye: No discharge.        Left eye: No discharge.     Conjunctiva/sclera: Conjunctivae normal.     Pupils: Pupils are equal, round, and reactive to light.  Cardiovascular:     Rate and Rhythm: Normal rate and regular rhythm.     Heart sounds: No murmur heard.   No friction rub. No gallop.  Pulmonary:     Effort: Pulmonary effort is normal. No respiratory distress.     Breath sounds: Normal breath sounds. No stridor. No rales.  Abdominal:     General: There is no distension.     Palpations: Abdomen is soft.     Tenderness: There is no abdominal tenderness.  Musculoskeletal:        General: No tenderness.     Cervical back: Normal range of motion and neck supple.  Skin:    General: Skin is warm and dry.     Findings: No erythema or rash.  Neurological:     Mental Status: She is alert and oriented to person, place, and time.    ED Results and Treatments Labs (all labs ordered are listed, but only abnormal results are displayed) Labs  Reviewed  RESP PANEL BY RT-PCR (FLU A&B, COVID) ARPGX2 - Abnormal; Notable for the following components:      Result Value   Influenza A by PCR POSITIVE (*)    All other components within normal limits                                                                                                                         EKG  EKG Interpretation  Date/Time:    Ventricular Rate:    PR Interval:    QRS Duration:   QT Interval:    QTC Calculation:   R Axis:     Text Interpretation:  Radiology No results found.  Pertinent labs & imaging results that were available during my care of the patient were reviewed by me and considered in my medical decision making (see MDM for details).  Medications Ordered in ED Medications  predniSONE (DELTASONE) tablet 20 mg (has no administration in time range)                                                                                                                                     Procedures Procedures  (including critical care time)  Medical Decision Making / ED Course     40 y.o. female presents with flu-like symptoms for 2 days. Adequate oral hydration. Rest of history as above.  Patient appears well. No signs of toxicity, patient is interactive. No hypoxia, tachypnea or other signs of respiratory distress. No sign of clinical dehydration. Lung exam clear. Rest of exam as above.  Most consistent with flu-like illness.   Influenza A+.  No evidence suggestive of pharyngitis, AOM, PNA, or meningitis.  Chest x-ray not indicated at this time.  Given a history of asthma, discussed treatment with Tamiflu.  We will also provide short course of steroids.   Discussed symptomatic treatment with the patient and they will follow closely with their PCP.     Final Clinical Impression(s) / ED Diagnoses Final diagnoses:  Influenza A   The patient appears reasonably screened and/or stabilized for discharge and I doubt any other  medical condition or other [facility] requiring further screening, evaluation, or treatment in the ED at this time prior to discharge. Safe for discharge with strict return precautions.  Disposition: Discharge  Condition: Good  I have discussed the results, Dx and Tx plan with the patient/family who expressed understanding and agree(s) with the plan. Discharge instructions discussed at length. The patient/family was given strict return precautions who verbalized understanding of the instructions. No further questions at time of discharge.    ED Discharge Orders          Ordered    oseltamivir (TAMIFLU) 75 MG capsule  Every 12 hours        12/05/21 0154    predniSONE (DELTASONE) 20 MG tablet  Daily        12/05/21 0154              Follow Up: Forrest Moron, MD [phone] [place] [address] [phone]  Call  to schedule an appointment for close follow up           This chart was dictated using voice recognition software.  Despite best efforts to proofread,  errors can occur which can change the documentation meaning.    Fatima Blank, MD 12/05/21 [phone]

## 2021-12-05 NOTE — Discharge Instructions (Addendum)
You may take over-the-counter medicine for symptomatic relief, such as Tylenol, Motrin, TheraFlu, Alka seltzer , black elderberry, etc. Please limit acetaminophen (Tylenol) to 4000 mg and Ibuprofen (Motrin, Advil, etc.) to 2400 mg for a 24hr period. Please note that other over-the-counter medicine may contain acetaminophen or ibuprofen as a component of their ingredients.   

## 2021-12-05 NOTE — ED Notes (Signed)
ED Provider at bedside. 

## 2022-02-18 ENCOUNTER — Ambulatory Visit: Payer: BC Managed Care – PPO | Admitting: Orthopaedic Surgery

## 2022-03-16 ENCOUNTER — Emergency Department (HOSPITAL_BASED_OUTPATIENT_CLINIC_OR_DEPARTMENT_OTHER): Payer: BC Managed Care – PPO

## 2022-03-16 ENCOUNTER — Encounter (HOSPITAL_BASED_OUTPATIENT_CLINIC_OR_DEPARTMENT_OTHER): Payer: Self-pay | Admitting: Emergency Medicine

## 2022-03-16 ENCOUNTER — Emergency Department (HOSPITAL_BASED_OUTPATIENT_CLINIC_OR_DEPARTMENT_OTHER)
Admission: EM | Admit: 2022-03-16 | Discharge: 2022-03-16 | Disposition: A | Discharge status: Home / Self Care | Payer: BC Managed Care – PPO | Attending: Emergency Medicine | Admitting: Emergency Medicine

## 2022-03-16 ENCOUNTER — Emergency Department (HOSPITAL_COMMUNITY): Payer: BC Managed Care – PPO

## 2022-03-16 ENCOUNTER — Other Ambulatory Visit: Payer: Self-pay

## 2022-03-16 DIAGNOSIS — R0602 Shortness of breath: Secondary | ICD-10-CM | POA: Insufficient documentation

## 2022-03-16 DIAGNOSIS — R0789 Other chest pain: Secondary | ICD-10-CM | POA: Diagnosis not present

## 2022-03-16 DIAGNOSIS — R600 Localized edema: Secondary | ICD-10-CM | POA: Insufficient documentation

## 2022-03-16 DIAGNOSIS — Z7901 Long term (current) use of anticoagulants: Secondary | ICD-10-CM | POA: Insufficient documentation

## 2022-03-16 DIAGNOSIS — M25561 Pain in right knee: Secondary | ICD-10-CM | POA: Diagnosis not present

## 2022-03-16 DIAGNOSIS — R1013 Epigastric pain: Secondary | ICD-10-CM | POA: Diagnosis not present

## 2022-03-16 DIAGNOSIS — R011 Cardiac murmur, unspecified: Secondary | ICD-10-CM | POA: Diagnosis not present

## 2022-03-16 DIAGNOSIS — I10 Essential (primary) hypertension: Secondary | ICD-10-CM | POA: Insufficient documentation

## 2022-03-16 LAB — CBC WITH DIFFERENTIAL/PLATELET
Abs Immature Granulocytes: 0 10*3/uL (ref 0.00–0.07)
Basophils Absolute: 0 10*3/uL (ref 0.0–0.1)
Basophils Relative: 0 %
Eosinophils Absolute: 0.1 10*3/uL (ref 0.0–0.5)
Eosinophils Relative: 2 %
HCT: 38.2 % (ref 36.0–46.0)
Hemoglobin: 12.2 g/dL (ref 12.0–15.0)
Immature Granulocytes: 0 %
Lymphocytes Relative: 28 %
Lymphs Abs: 1.1 10*3/uL (ref 0.7–4.0)
MCH: 29.7 pg (ref 26.0–34.0)
MCHC: 31.9 g/dL (ref 30.0–36.0)
MCV: 92.9 fL (ref 80.0–100.0)
Monocytes Absolute: 0.4 10*3/uL (ref 0.1–1.0)
Monocytes Relative: 10 %
Neutro Abs: 2.5 10*3/uL (ref 1.7–7.7)
Neutrophils Relative %: 60 %
Platelets: 208 10*3/uL (ref 150–400)
RBC: 4.11 MIL/uL (ref 3.87–5.11)
RDW: 13.6 % (ref 11.5–15.5)
WBC: 4.1 10*3/uL (ref 4.0–10.5)
nRBC: 0 % (ref 0.0–0.2)

## 2022-03-16 LAB — COMPREHENSIVE METABOLIC PANEL
ALT: 34 U/L (ref 0–44)
AST: 27 U/L (ref 15–41)
Albumin: 4 g/dL (ref 3.5–5.0)
Alkaline Phosphatase: 86 U/L (ref 38–126)
Anion gap: 3 — ABNORMAL LOW (ref 5–15)
BUN: 9 mg/dL (ref 6–20)
CO2: 26 mmol/L (ref 22–32)
Calcium: 8.4 mg/dL — ABNORMAL LOW (ref 8.9–10.3)
Chloride: 110 mmol/L (ref 98–111)
Creatinine, Ser: 0.72 mg/dL (ref 0.44–1.00)
GFR, Estimated: >60 mL/min (ref >60)
Glucose, Bld: 84 mg/dL (ref 70–99)
Potassium: 3.6 mmol/L (ref 3.5–5.1)
Sodium: 139 mmol/L (ref 135–145)
Total Bilirubin: 0.9 mg/dL (ref 0.3–1.2)
Total Protein: 7.2 g/dL (ref 6.5–8.1)

## 2022-03-16 LAB — LIPASE, BLOOD: Lipase: 32 U/L (ref 11–51)

## 2022-03-16 LAB — D-DIMER, QUANTITATIVE: D-Dimer, Quant: <0.27 ug/mL-FEU (ref 0.00–0.50)

## 2022-03-16 LAB — TROPONIN I (HIGH SENSITIVITY): Troponin I (High Sensitivity): <2 ng/L (ref <18)

## 2022-03-16 MED ORDER — ALUM & MAG HYDROXIDE-SIMETH 200-200-20 MG/5ML PO SUSP
30.0000 mL | Freq: Once | ORAL | Status: AC
Start: 2022-03-16 — End: 2022-03-16
  Administered 2022-03-16: 30 mL via ORAL
  Filled 2022-03-16: qty 30

## 2022-03-16 MED ORDER — LIDOCAINE VISCOUS HCL 2 % MT SOLN
15.0000 mL | Freq: Once | OROMUCOSAL | Status: AC
  Administered 2022-03-16: 15 mL via ORAL
  Filled 2022-03-16: qty 15

## 2022-03-16 NOTE — ED Provider Notes (Signed)
?[facility] EMERGENCY DEPARTMENT ?Provider Note ? ? ?CSN: [id number] ?Arrival date & time: 03/16/22  1912 ? ?  ? ?History ? ?Chief Complaint  ?Patient presents with  ? Knee Pain  ? ? ?Amber Daniels is a 41 y.o. female. ? ?HPI ?Patient is a 41 year old female with a history of laparoscopic sleeve gastrectomy, hypertension, bilateral lower extremity edema, venous varicosities of the lower extremities, who presents to the emergency department due to right leg pain.  Patient states that she has been experiencing intermittent right knee pain.  Earlier today her knee "gave out" causing worsening pain.  Also notes worsening pain over the right calf moving up the right posterior leg.  She is followed by vascular surgery for venous varicosities and has had stab phlebectomies as well as ablations.  Patient also notes that yesterday was her deceased father's birthday.  She was more stressed than normal due to this.  She states that over the past few days she has been having intermittent chest pressure and shortness of breath.  Also notes that she has been having intermittent epigastric pain.  No nausea or vomiting.   ?  ? ?Home Medications ?Prior to Admission medications   ?Medication Sig Start Date End Date Taking? Authorizing Provider  ?acetaminophen (TYLENOL) 500 MG tablet Take 1,500 mg by mouth every 6 (six) hours as needed. For pain.    [provider]  ?albuterol (VENTOLIN HFA) 108 (90 Base) MCG/ACT inhaler Inhale 2 puffs into the lungs every 6 (six) hours as needed for wheezing or shortness of breath. 05/06/19   Doristine Bosworth, MD  ?B Complex Vitamins (VITAMIN B-COMPLEX) TABS Take by mouth.    [provider]  ?beclomethasone (QVAR) 40 MCG/ACT inhaler Inhale 2 puffs into the lungs daily. 05/06/19   Doristine Bosworth, MD  ?cetirizine (ZYRTEC) 10 MG tablet Take 10 mg by mouth daily.    [provider]  ?citalopram (CELEXA) 40 MG tablet Take 0.5 tablets (20 mg total) by mouth daily.  06/21/19   Corum, Minerva Fester, MD  ?ELIQUIS 2.5 MG TABS tablet Take 2.5 mg by mouth 2 (two) times daily. ?Patient not taking: Reported on 02/27/2021 08/20/19   [provider]  ?fexofenadine (ALLEGRA) 180 MG tablet Take 180 mg by mouth daily.    [provider]  ?fluconazole (DIFLUCAN) 150 MG tablet Take 1 tablet (150 mg total) by mouth daily as needed (take at least 3 days between doses as needed for vaginal irritaiton). 06/21/19   Wandra Feinstein, MD  ?Fluocinolone Acetonide Scalp 0.01 % OIL Apply topically to affected areas 05/06/19   Collie Siad A, MD  ?furosemide (LASIX) 20 MG tablet Take 1 tablet (20 mg total) by mouth daily as needed. 12/16/19   Doristine Bosworth, MD  ?HYDROcodone-acetaminophen (HYCET) 7.5-325 mg/15 ml solution TAKE 15 ML (7.5 MG OF HYDROCODONE TOTAL) BY MOUTH EVERY 4 (FOUR) HOURS AS NEEDED FOR UP TO 7 DAYS. 08/17/19   [provider]  ?ketoconazole (NIZORAL) 2 % shampoo Apply 1 application topically 2 (two) times a week. 05/09/19   Doristine Bosworth, MD  ?levocetirizine (XYZAL) 5 MG tablet Take 5 mg by mouth every evening.    [provider]  ?LORazepam (ATIVAN) 1 MG tablet Take 1 tablet 30 minutes prior to leaving house on day of office surgery.  Bring second tablet with you to the office. ?Patient not taking: No sig reported 02/21/21   Sherren Kerns, MD  ?metroNIDAZOLE (FLAGYL) 500 MG tablet Take 1  tablet (500 mg total) by mouth 2 (two) times daily. 12/16/19   Doristine Bosworth, MD  ?metroNIDAZOLE (METROGEL) 0.75 % gel Apply 1 application topically 2 (two) times daily.    [provider]  ?montelukast (SINGULAIR) 10 MG tablet Take 1 tablet (10 mg total) by mouth at bedtime. 05/06/19   Doristine Bosworth, MD  ?omeprazole (PRILOSEC) 20 MG capsule Take 1 capsule (20 mg total) daily by mouth. 10/07/17   Doristine Bosworth, MD  ?ondansetron (ZOFRAN-ODT) 4 MG disintegrating tablet DISSOLVE 1 TABLET ON THE TONGUE EVERY 8 (EIGHT) HOURS AS NEEDED FOR NAUSEA FOR UP TO 7 DAYS.  08/17/19   [provider]  ?oseltamivir (TAMIFLU) 75 MG capsule Take 1 capsule (75 mg total) by mouth every 12 (twelve) hours. 12/05/21   CardamaAmadeo Garnet, MD  ?Pancrelipase, Lip-Prot-Amyl, (CREON PO) Take by mouth daily.    [provider]  ?pantoprazole (PROTONIX) 40 MG tablet Take 40 mg by mouth daily.    [provider]  ?pseudoephedrine (SUDAFED 12 HOUR) 120 MG 12 hr tablet Take 1 tablet (120 mg total) by mouth 2 (two) times daily. 02/03/18   Wallis Bamberg, PA-C  ?sucralfate (CARAFATE) 1 g tablet Take 1 tablet (1 g total) by mouth daily as needed. 12/16/19 02/14/20  Doristine Bosworth, MD  ?triamcinolone (NASACORT) 55 MCG/ACT AERO nasal inhaler Place 2 sprays into the nose daily.    [provider]  ?Vitamin D, Ergocalciferol, (DRISDOL) 1.25 MG (50000 UNIT) CAPS capsule TAKE 1 CAPSULE (50,000 UNITS TOTAL) BY MOUTH EVERY 7 (SEVEN) DAYS. 02/06/20   Doristine Bosworth, MD  ?   ?Allergies    ?Bee venom and Other   ? ?Review of Systems   ?Review of Systems  ?All other systems reviewed and are negative. ?Ten systems reviewed and are negative for acute change, except as noted in the HPI.   ?Physical Exam ?Updated Vital Signs ?BP (!) 137/96   Pulse 61   Temp 98 ?F (36.7 ?C) (Oral)   Resp 16   Ht 5\' 3"  (1.6 m)   Wt 105.2 kg   SpO2 100%   BMI 41.10 kg/m?  ?Physical Exam ?Vitals and nursing note reviewed.  ?Constitutional:   ?   General: She is not in acute distress. ?   Appearance: Normal appearance. She is not ill-appearing, toxic-appearing or diaphoretic.  ?HENT:  ?   Head: Normocephalic and atraumatic.  ?   Right Ear: External ear normal.  ?   Left Ear: External ear normal.  ?   Nose: Nose normal.  ?   Mouth/Throat:  ?   Mouth: Mucous membranes are moist.  ?   Pharynx: Oropharynx is clear. No oropharyngeal exudate or posterior oropharyngeal erythema.  ?Eyes:  ?   General: No scleral icterus.    ?   Right eye: No discharge.     ?   Left eye: No discharge.  ?   Extraocular Movements:  Extraocular movements intact.  ?   Conjunctiva/sclera: Conjunctivae normal.  ?Cardiovascular:  ?   Rate and Rhythm: Normal rate and regular rhythm.  ?   Pulses: Normal pulses.  ?   Heart sounds: Murmur heard.  ?  No friction rub. No gallop.  ?Pulmonary:  ?   Effort: Pulmonary effort is normal. No respiratory distress.  ?   Breath sounds: Normal breath sounds. No stridor. No wheezing, rhonchi or rales.  ?Abdominal:  ?   General: Abdomen is flat.  ?   Palpations: Abdomen is  soft.  ?   Tenderness: There is abdominal tenderness.  ?   Comments: Mild TTP noted overlying the epigastrium.  ?Musculoskeletal:     ?   General: Normal range of motion.  ?   Cervical back: Normal range of motion and neck supple. No tenderness.  ?   Right lower leg: Edema present.  ?   Left lower leg: Edema present.  ?   Comments: Difficult to assess due to body habitus.  Trace pedal edema noted bilaterally.  Venous varicosities with telangiectasias noted on the bilateral lower extremities.  Patient has mild tenderness noted along the right calf moving up the right posterior leg.  No palpable cords.  Additional mild tenderness noted diffusely along the right anterior knee.  Full passive and active range of motion of the knee but this does worsen the patient's pain.  ?Skin: ?   General: Skin is warm and dry.  ?Neurological:  ?   General: No focal deficit present.  ?   Mental Status: She is alert and oriented to person, place, and time.  ?Psychiatric:     ?   Mood and Affect: Mood normal.     ?   Behavior: Behavior normal.  ? ?ED Results / Procedures / Treatments   ?Labs ?(all labs ordered are listed, but only abnormal results are displayed) ?Labs Reviewed  ?COMPREHENSIVE METABOLIC PANEL - Abnormal; Notable for the following components:  ?    Result Value  ? Calcium 8.4 (*)   ? Anion gap 3 (*)   ? All other components within normal limits  ?CBC WITH DIFFERENTIAL/PLATELET  ?LIPASE, BLOOD  ?D-DIMER, QUANTITATIVE  ?TROPONIN I (HIGH SENSITIVITY)   ? ?EKG ?EKG Interpretation ? ?Date/Time:  Sunday March 16 2022 19:29:40 EDT ?Ventricular Rate:  61 ?PR Interval:  187 ?QRS Duration: 97 ?QT Interval:  426 ?QTC Calculation: 430 ?R Axis:   41 ?Text Interpretation: Sinus

## 2022-03-16 NOTE — Discharge Instructions (Addendum)
Like we discussed, please follow-up with your primary care provider as well as your orthopedist regarding your symptoms as well as this visit today.  If you develop any new or worsening symptoms please come back to the emergency department. ?

## 2022-03-16 NOTE — ED Notes (Signed)
Patient transported to Ultrasound 

## 2022-03-16 NOTE — ED Triage Notes (Addendum)
Pt reports RT knee and lower leg pain x 3 wks; no injury; pt also reports chest pressure and heart palpitations ?

## 2022-03-24 ENCOUNTER — Other Ambulatory Visit: Payer: Self-pay | Admitting: Family Medicine

## 2022-03-24 DIAGNOSIS — Z1231 Encounter for screening mammogram for malignant neoplasm of breast: Secondary | ICD-10-CM

## 2022-03-27 ENCOUNTER — Ambulatory Visit: Payer: BC Managed Care – PPO | Admitting: Orthopaedic Surgery

## 2022-04-01 ENCOUNTER — Ambulatory Visit: Payer: BC Managed Care – PPO | Admitting: Orthopaedic Surgery

## 2022-04-02 ENCOUNTER — Ambulatory Visit: Payer: BC Managed Care – PPO

## 2022-06-11 ENCOUNTER — Other Ambulatory Visit: Payer: Self-pay | Admitting: Family Medicine

## 2022-06-11 DIAGNOSIS — Z1231 Encounter for screening mammogram for malignant neoplasm of breast: Secondary | ICD-10-CM

## 2022-07-03 ENCOUNTER — Ambulatory Visit
Admission: RE | Admit: 2022-07-03 | Discharge: 2022-07-03 | Disposition: A | Discharge status: Home / Self Care | Payer: BC Managed Care – PPO | Source: Ambulatory Visit | Attending: Family Medicine | Admitting: Family Medicine

## 2022-07-03 DIAGNOSIS — Z1231 Encounter for screening mammogram for malignant neoplasm of breast: Secondary | ICD-10-CM

## 2022-08-06 ENCOUNTER — Emergency Department (HOSPITAL_BASED_OUTPATIENT_CLINIC_OR_DEPARTMENT_OTHER)
Admission: EM | Admit: 2022-08-06 | Discharge: 2022-08-06 | Disposition: A | Discharge status: Home / Self Care | Payer: BC Managed Care – PPO | Attending: Emergency Medicine | Admitting: Emergency Medicine

## 2022-08-06 ENCOUNTER — Other Ambulatory Visit: Payer: Self-pay

## 2022-08-06 ENCOUNTER — Encounter (HOSPITAL_BASED_OUTPATIENT_CLINIC_OR_DEPARTMENT_OTHER): Payer: Self-pay | Admitting: Pediatrics

## 2022-08-06 DIAGNOSIS — I10 Essential (primary) hypertension: Secondary | ICD-10-CM | POA: Diagnosis not present

## 2022-08-06 DIAGNOSIS — J45909 Unspecified asthma, uncomplicated: Secondary | ICD-10-CM | POA: Diagnosis not present

## 2022-08-06 DIAGNOSIS — J029 Acute pharyngitis, unspecified: Secondary | ICD-10-CM

## 2022-08-06 DIAGNOSIS — Z79899 Other long term (current) drug therapy: Secondary | ICD-10-CM | POA: Diagnosis not present

## 2022-08-06 DIAGNOSIS — Z7901 Long term (current) use of anticoagulants: Secondary | ICD-10-CM | POA: Insufficient documentation

## 2022-08-06 DIAGNOSIS — U071 COVID-19: Secondary | ICD-10-CM | POA: Insufficient documentation

## 2022-08-06 LAB — SARS CORONAVIRUS 2 BY RT PCR: SARS Coronavirus 2 by RT PCR: POSITIVE — AB

## 2022-08-06 LAB — PREGNANCY, URINE: Preg Test, Ur: NEGATIVE

## 2022-08-06 MED ORDER — KETOROLAC TROMETHAMINE 15 MG/ML IJ SOLN
15.0000 mg | Freq: Once | INTRAMUSCULAR | Status: AC
  Administered 2022-08-06: 15 mg via INTRAMUSCULAR
  Filled 2022-08-06: qty 1

## 2022-08-06 MED ORDER — HYDROCODONE BIT-HOMATROP MBR 5-1.5 MG/5ML PO SOLN: 5.0000 mL | Freq: Four times a day (QID) | ORAL | 0 refills | Status: DC | PRN

## 2022-08-06 MED ORDER — ONDANSETRON 4 MG PO TBDP
4.0000 mg | ORAL_TABLET | Freq: Three times a day (TID) | ORAL | 0 refills | Status: AC | PRN
Start: 2022-08-06 — End: ?

## 2022-08-06 MED ORDER — DEXAMETHASONE SODIUM PHOSPHATE 10 MG/ML IJ SOLN
10.0000 mg | Freq: Once | INTRAMUSCULAR | Status: AC
  Administered 2022-08-06: 10 mg via INTRAMUSCULAR
  Filled 2022-08-06: qty 1

## 2022-08-06 NOTE — ED Provider Notes (Signed)
[facility] Provider Note   CSN: [id number] Arrival date & time: 08/06/22  1252     History  Chief Complaint  Patient presents with   Sore Throat    Amber Daniels is a 41 y.o. female with a past medical history significant for hypertension, asthma, morbid obesity, and allergies who presents to the ED due to sore throat, nasal congestion, and cough x2 days.  Daughter sick with similar symptoms.  Denies chest pain and shortness of breath.  No abdominal pain, nausea, vomiting, or diarrhea.  She has been taking over-the-counter medications with mild relief.  No difficulty swallowing, changes to phonation, or trismus.  History obtained from patient and past medical records. No interpreter used during encounter.       Home Medications Prior to Admission medications   Medication Sig Start Date End Date Taking? Authorizing Provider  HYDROcodone bit-homatropine (HYCODAN) 5-1.5 MG/5ML syrup Take 5 mLs by mouth every 6 (six) hours as needed for cough. 08/06/22  Yes Sherryl Valido C, PA-C  ondansetron (ZOFRAN-ODT) 4 MG disintegrating tablet Take 1 tablet (4 mg total) by mouth every 8 (eight) hours as needed for nausea or vomiting. 08/06/22  Yes Lesly Joslyn, Druscilla Brownie, PA-C  acetaminophen (TYLENOL) 500 MG tablet Take 1,500 mg by mouth every 6 (six) hours as needed. For pain.    [provider]  albuterol (VENTOLIN HFA) 108 (90 Base) MCG/ACT inhaler Inhale 2 puffs into the lungs every 6 (six) hours as needed for wheezing or shortness of breath. 05/06/19   Forrest Moron, MD  B Complex Vitamins (VITAMIN B-COMPLEX) TABS Take by mouth.    [provider]  beclomethasone (QVAR) 40 MCG/ACT inhaler Inhale 2 puffs into the lungs daily. 05/06/19   Forrest Moron, MD  cetirizine (ZYRTEC) 10 MG tablet Take 10 mg by mouth daily.    [provider]  citalopram (CELEXA) 40 MG tablet Take 0.5 tablets (20 mg total) by mouth daily. 06/21/19   Corum, Rex Kras, MD   ELIQUIS 2.5 MG TABS tablet Take 2.5 mg by mouth 2 (two) times daily. Patient not taking: Reported on 02/27/2021 08/20/19   [provider]  fexofenadine (ALLEGRA) 180 MG tablet Take 180 mg by mouth daily.    [provider]  fluconazole (DIFLUCAN) 150 MG tablet Take 1 tablet (150 mg total) by mouth daily as needed (take at least 3 days between doses as needed for vaginal irritaiton). 06/21/19   Maryruth Hancock, MD  Fluocinolone Acetonide Scalp 0.01 % OIL Apply topically to affected areas 05/06/19   Delia Chimes A, MD  furosemide (LASIX) 20 MG tablet Take 1 tablet (20 mg total) by mouth daily as needed. 12/16/19   Forrest Moron, MD  HYDROcodone-acetaminophen (HYCET) 7.5-325 mg/15 ml solution TAKE 15 ML (7.5 MG OF HYDROCODONE TOTAL) BY MOUTH EVERY 4 (FOUR) HOURS AS NEEDED FOR UP TO 7 DAYS. 08/17/19   [provider]  ketoconazole (NIZORAL) 2 % shampoo Apply 1 application topically 2 (two) times a week. 05/09/19   Forrest Moron, MD  levocetirizine (XYZAL) 5 MG tablet Take 5 mg by mouth every evening.    [provider]  LORazepam (ATIVAN) 1 MG tablet Take 1 tablet 30 minutes prior to leaving house on day of office surgery.  Bring second tablet with you to the office. Patient not taking: No sig reported 02/21/21   Elam Dutch, MD  metroNIDAZOLE (FLAGYL) 500 MG tablet Take 1 tablet (500 mg total) by mouth  2 (two) times daily. 12/16/19   Doristine Bosworth, MD  metroNIDAZOLE (METROGEL) 0.75 % gel Apply 1 application topically 2 (two) times daily.    [provider]  montelukast (SINGULAIR) 10 MG tablet Take 1 tablet (10 mg total) by mouth at bedtime. 05/06/19   Doristine Bosworth, MD  omeprazole (PRILOSEC) 20 MG capsule Take 1 capsule (20 mg total) daily by mouth. 10/07/17   Collie Siad A, MD  ondansetron (ZOFRAN-ODT) 4 MG disintegrating tablet DISSOLVE 1 TABLET ON THE TONGUE EVERY 8 (EIGHT) HOURS AS NEEDED FOR NAUSEA FOR UP TO 7 DAYS. 08/17/19   [provider]  oseltamivir (TAMIFLU) 75 MG capsule Take 1 capsule (75 mg total) by mouth every 12 (twelve) hours. 12/05/21   Cardama, Amadeo Garnet, MD  Pancrelipase, Lip-Prot-Amyl, (CREON PO) Take by mouth daily.    [provider]  pantoprazole (PROTONIX) 40 MG tablet Take 40 mg by mouth daily.    [provider]  pseudoephedrine (SUDAFED 12 HOUR) 120 MG 12 hr tablet Take 1 tablet (120 mg total) by mouth 2 (two) times daily. 02/03/18   Wallis Bamberg, PA-C  sucralfate (CARAFATE) 1 g tablet Take 1 tablet (1 g total) by mouth daily as needed. 12/16/19 02/14/20  Doristine Bosworth, MD  triamcinolone (NASACORT) 55 MCG/ACT AERO nasal inhaler Place 2 sprays into the nose daily.    [provider]  Vitamin D, Ergocalciferol, (DRISDOL) 1.25 MG (50000 UNIT) CAPS capsule TAKE 1 CAPSULE (50,000 UNITS TOTAL) BY MOUTH EVERY 7 (SEVEN) DAYS. 02/06/20   Doristine Bosworth, MD      Allergies    Bee venom and Other    Review of Systems   Review of Systems  Constitutional:  Negative for chills and fever.  HENT:  Positive for congestion, sneezing and sore throat. Negative for ear pain.   Respiratory:  Positive for cough. Negative for shortness of breath.   Cardiovascular:  Negative for chest pain.  Gastrointestinal:  Negative for abdominal pain, nausea and vomiting.    Physical Exam Updated Vital Signs BP 131/82 (BP Location: Right Arm)   Pulse 81   Temp 98.7 F (37.1 C) (Oral)   Resp 18   Ht 5\' 3"  (1.6 m)   Wt 101.6 kg   SpO2 98%   BMI 39.68 kg/m  Physical Exam Vitals and nursing note reviewed.  Constitutional:      General: She is not in acute distress.    Appearance: She is not ill-appearing.  HENT:     Head: Normocephalic.     Mouth/Throat:     Comments: Posterior oropharynx clear and mucous membranes moist, there is mild erythema but no edema or tonsillar exudates, uvula midline, normal phonation, no trismus, tolerating secretions without difficulty. Eyes:     Pupils:  Pupils are equal, round, and reactive to light.  Cardiovascular:     Rate and Rhythm: Normal rate and regular rhythm.     Pulses: Normal pulses.     Heart sounds: Normal heart sounds. No murmur heard.    No friction rub. No gallop.  Pulmonary:     Effort: Pulmonary effort is normal.     Breath sounds: Normal breath sounds.  Abdominal:     General: Abdomen is flat. There is no distension.     Palpations: Abdomen is soft.     Tenderness: There is no abdominal tenderness. There is no guarding or rebound.  Musculoskeletal:        General: Normal range of motion.  Cervical back: Neck supple.  Skin:    General: Skin is warm and dry.  Neurological:     General: No focal deficit present.     Mental Status: She is alert.  Psychiatric:        Mood and Affect: Mood normal.        Behavior: Behavior normal.     ED Results / Procedures / Treatments   Labs (all labs ordered are listed, but only abnormal results are displayed) Labs Reviewed  SARS CORONAVIRUS 2 BY RT PCR - Abnormal; Notable for the following components:      Result Value   SARS Coronavirus 2 by RT PCR POSITIVE (*)    All other components within normal limits  PREGNANCY, URINE    EKG None  Radiology No results found.  Procedures Procedures    Medications Ordered in ED Medications  ketorolac (TORADOL) 15 MG/ML injection 15 mg (has no administration in time range)  dexamethasone (DECADRON) injection 10 mg (has no administration in time range)    ED Course/ Medical Decision Making/ A&P Clinical Course as of 08/06/22 1607  Wed Aug 06, 2022  1547 SARS Coronavirus 2 by RT PCR(!): POSITIVE [CA]    Clinical Course User Index [CA] Suzy Bouchard, PA-C                           Medical Decision Making Amount and/or Complexity of Data Reviewed Labs: ordered. Decision-making details documented in ED Course.  Risk Prescription drug management.   41 year old female presents to the ED due to sore  throat, nasal congestion, and cough x2 days.  Daughter sick with similar symptoms.  Upon arrival, vitals all within normal limits.  Patient is afebrile, not tachycardic or hypoxic.  Patient in no acute distress.  History of asthma.  Patient denies wheeze or shortness of breath.  Benign physical exam.  Lungs clear to auscultation bilaterally without stridor or wheeze.  Mild erythema to throat.  No abscess.  Uvula midline.  COVID test positive.  Suspect symptoms related to COVID-19 infection.  Patient without wheeze. Low suspicion for asthma exacerbation. Low suspicion for pneumonia. No evidence of respiratory distress. Patient treated with Toradol and Decadron in the ED for sore throat with improvement in symptoms after reassessment. Patient discharged with cough syrup and Zofran.  Quarantine guidelines discussed with patient.  Advised patient follow-up with PCP if symptoms do not improve over the next few days. Strict ED precautions discussed with patient. Patient states understanding and agrees to plan. Patient discharged home in no acute distress and stable vitals.  Hannalee Wais was evaluated in Emergency Department on 08/06/2022 for the symptoms described in the history of present illness. She was evaluated in the context of the global COVID-19 pandemic, which necessitated consideration that the patient might be at risk for infection with the SARS-CoV-2 virus that causes COVID-19. Institutional protocols and algorithms that pertain to the evaluation of patients at risk for COVID-19 are in a state of rapid change based on information released by regulatory bodies including the CDC and federal and state organizations. These policies and algorithms were followed during the patient's care in the ED.          Final Clinical Impression(s) / ED Diagnoses Final diagnoses:  [id number] virus infection  Sore throat    Rx / DC Orders ED Discharge Orders          Ordered    HYDROcodone bit-homatropine  (HYCODAN) 5-1.5  MG/5ML syrup  Every 6 hours PRN        08/06/22 1606    ondansetron (ZOFRAN-ODT) 4 MG disintegrating tablet  Every 8 hours PRN        08/06/22 1606              Mannie Stabile, PA-C 08/06/22 1612    Glyn Ade, MD 08/06/22 2344

## 2022-08-06 NOTE — Discharge Instructions (Signed)
It was a pleasure taking care of you today.  As discussed, your COVID test was positive.  I am sending you home with cough syrup and nausea medication.  Take as needed.  Cough syrup can cause drowsiness so do not drive or operate machinery while on the medication.  You must quarantine for 5 days since symptom onset.  Follow-up with PCP if symptoms do not improve over the next few days.  Return to the ER for any worsening symptoms.

## 2022-08-06 NOTE — ED Triage Notes (Signed)
Reported daughter at home has some URI symptoms and tested + for covid; c/o sore throat since Monday;

## 2022-08-06 NOTE — ED Notes (Signed)
Reviewed discharge instructions and prescriptions with pt. Pt states understanding. Pt ambulatory upon discharge

## 2022-11-28 ENCOUNTER — Emergency Department (HOSPITAL_BASED_OUTPATIENT_CLINIC_OR_DEPARTMENT_OTHER)
Admission: EM | Admit: 2022-11-28 | Discharge: 2022-11-29 | Disposition: A | Discharge status: Home / Self Care | Payer: BC Managed Care – PPO | Attending: Emergency Medicine | Admitting: Emergency Medicine

## 2022-11-28 ENCOUNTER — Emergency Department (HOSPITAL_BASED_OUTPATIENT_CLINIC_OR_DEPARTMENT_OTHER): Payer: BC Managed Care – PPO

## 2022-11-28 ENCOUNTER — Encounter (HOSPITAL_BASED_OUTPATIENT_CLINIC_OR_DEPARTMENT_OTHER): Payer: Self-pay | Admitting: Urology

## 2022-11-28 ENCOUNTER — Other Ambulatory Visit: Payer: Self-pay

## 2022-11-28 DIAGNOSIS — R197 Diarrhea, unspecified: Secondary | ICD-10-CM | POA: Diagnosis not present

## 2022-11-28 DIAGNOSIS — R1012 Left upper quadrant pain: Secondary | ICD-10-CM | POA: Diagnosis not present

## 2022-11-28 DIAGNOSIS — Z7901 Long term (current) use of anticoagulants: Secondary | ICD-10-CM | POA: Diagnosis not present

## 2022-11-28 DIAGNOSIS — R1013 Epigastric pain: Secondary | ICD-10-CM | POA: Diagnosis present

## 2022-11-28 DIAGNOSIS — I10 Essential (primary) hypertension: Secondary | ICD-10-CM | POA: Insufficient documentation

## 2022-11-28 DIAGNOSIS — R110 Nausea: Secondary | ICD-10-CM | POA: Diagnosis not present

## 2022-11-28 LAB — COMPREHENSIVE METABOLIC PANEL
ALT: 35 U/L (ref 0–44)
AST: 23 U/L (ref 15–41)
Albumin: 3.9 g/dL (ref 3.5–5.0)
Alkaline Phosphatase: 93 U/L (ref 38–126)
Anion gap: 6 (ref 5–15)
BUN: 7 mg/dL (ref 6–20)
CO2: 25 mmol/L (ref 22–32)
Calcium: 8.4 mg/dL — ABNORMAL LOW (ref 8.9–10.3)
Chloride: 106 mmol/L (ref 98–111)
Creatinine, Ser: 0.58 mg/dL (ref 0.44–1.00)
GFR, Estimated: >60 mL/min (ref >60)
Glucose, Bld: 87 mg/dL (ref 70–99)
Potassium: 3.5 mmol/L (ref 3.5–5.1)
Sodium: 137 mmol/L (ref 135–145)
Total Bilirubin: 0.7 mg/dL (ref 0.3–1.2)
Total Protein: 7.3 g/dL (ref 6.5–8.1)

## 2022-11-28 LAB — CBC WITH DIFFERENTIAL/PLATELET
Abs Immature Granulocytes: 0.01 10*3/uL (ref 0.00–0.07)
Basophils Absolute: 0 10*3/uL (ref 0.0–0.1)
Basophils Relative: 0 %
Eosinophils Absolute: 0.1 10*3/uL (ref 0.0–0.5)
Eosinophils Relative: 2 %
HCT: 40.1 % (ref 36.0–46.0)
Hemoglobin: 13 g/dL (ref 12.0–15.0)
Immature Granulocytes: 0 %
Lymphocytes Relative: 33 %
Lymphs Abs: 1.5 10*3/uL (ref 0.7–4.0)
MCH: 29.9 pg (ref 26.0–34.0)
MCHC: 32.4 g/dL (ref 30.0–36.0)
MCV: 92.2 fL (ref 80.0–100.0)
Monocytes Absolute: 0.4 10*3/uL (ref 0.1–1.0)
Monocytes Relative: 9 %
Neutro Abs: 2.5 10*3/uL (ref 1.7–7.7)
Neutrophils Relative %: 56 %
Platelets: 193 10*3/uL (ref 150–400)
RBC: 4.35 MIL/uL (ref 3.87–5.11)
RDW: 12.7 % (ref 11.5–15.5)
WBC: 4.6 10*3/uL (ref 4.0–10.5)
nRBC: 0 % (ref 0.0–0.2)

## 2022-11-28 LAB — LIPASE, BLOOD: Lipase: 69 U/L — ABNORMAL HIGH (ref 11–51)

## 2022-11-28 LAB — URINALYSIS, ROUTINE W REFLEX MICROSCOPIC
Bilirubin Urine: NEGATIVE
Glucose, UA: NEGATIVE mg/dL
Hgb urine dipstick: NEGATIVE
Ketones, ur: NEGATIVE mg/dL
Leukocytes,Ua: NEGATIVE
Nitrite: NEGATIVE
Protein, ur: NEGATIVE mg/dL
Specific Gravity, Urine: 1.025 (ref 1.005–1.030)
pH: 6.5 (ref 5.0–8.0)

## 2022-11-28 LAB — PREGNANCY, URINE: Preg Test, Ur: NEGATIVE

## 2022-11-28 MED ORDER — PANTOPRAZOLE SODIUM 20 MG PO TBEC: 20.0000 mg | DELAYED_RELEASE_TABLET | Freq: Every day | ORAL | 0 refills | Status: AC

## 2022-11-28 MED ORDER — ALUM & MAG HYDROXIDE-SIMETH 200-200-20 MG/5ML PO SUSP
30.0000 mL | Freq: Once | ORAL | Status: AC
  Administered 2022-11-28: 30 mL via ORAL
  Filled 2022-11-28: qty 30

## 2022-11-28 MED ORDER — LIDOCAINE VISCOUS HCL 2 % MT SOLN
15.0000 mL | Freq: Once | OROMUCOSAL | Status: AC
  Administered 2022-11-28: 15 mL via ORAL
  Filled 2022-11-28: qty 15

## 2022-11-28 MED ORDER — IOHEXOL 300 MG/ML  SOLN
80.0000 mL | Freq: Once | INTRAMUSCULAR | Status: AC | PRN
  Administered 2022-11-28: 80 mL via INTRAVENOUS

## 2022-11-28 MED ORDER — SUCRALFATE 1 G PO TABS: 1.0000 g | ORAL_TABLET | Freq: Three times a day (TID) | ORAL | 0 refills | Status: AC

## 2022-11-28 NOTE — ED Provider Notes (Incomplete)
[facility]PARTMENT Provider Note   CSN: [id number] Arrival date & time: 11/28/22  1956     History {Add pertinent medical, surgical, social history, OB history to HPI:1} Chief Complaint  Patient presents with   Abdominal Pain    Amber Daniels is a 41 y.o. female.   Abdominal Pain      Home Medications Prior to Admission medications   Medication Sig Start Date End Date Taking? Authorizing Provider  acetaminophen (TYLENOL) 500 MG tablet Take 1,500 mg by mouth every 6 (six) hours as needed. For pain.    [provider]  albuterol (VENTOLIN HFA) 108 (90 Base) MCG/ACT inhaler Inhale 2 puffs into the lungs every 6 (six) hours as needed for wheezing or shortness of breath. 05/06/19   Doristine Bosworth, MD  B Complex Vitamins (VITAMIN B-COMPLEX) TABS Take by mouth.    [provider]  beclomethasone (QVAR) 40 MCG/ACT inhaler Inhale 2 puffs into the lungs daily. 05/06/19   Doristine Bosworth, MD  cetirizine (ZYRTEC) 10 MG tablet Take 10 mg by mouth daily.    [provider]  citalopram (CELEXA) 40 MG tablet Take 0.5 tablets (20 mg total) by mouth daily. 06/21/19   Corum, Minerva Fester, MD  ELIQUIS 2.5 MG TABS tablet Take 2.5 mg by mouth 2 (two) times daily. Patient not taking: Reported on 02/27/2021 08/20/19   [provider]  fexofenadine (ALLEGRA) 180 MG tablet Take 180 mg by mouth daily.    [provider]  fluconazole (DIFLUCAN) 150 MG tablet Take 1 tablet (150 mg total) by mouth daily as needed (take at least 3 days between doses as needed for vaginal irritaiton). 06/21/19   Wandra Feinstein, MD  Fluocinolone Acetonide Scalp 0.01 % OIL Apply topically to affected areas 05/06/19   Collie Siad A, MD  furosemide (LASIX) 20 MG tablet Take 1 tablet (20 mg total) by mouth daily as needed. 12/16/19   Doristine Bosworth, MD  HYDROcodone bit-homatropine (HYCODAN) 5-1.5 MG/5ML syrup Take 5 mLs by mouth every 6 (six) hours as needed for cough.  08/06/22   Mannie Stabile, PA-C  HYDROcodone-acetaminophen (HYCET) 7.5-325 mg/15 ml solution TAKE 15 ML (7.5 MG OF HYDROCODONE TOTAL) BY MOUTH EVERY 4 (FOUR) HOURS AS NEEDED FOR UP TO 7 DAYS. 08/17/19   [provider]  ketoconazole (NIZORAL) 2 % shampoo Apply 1 application topically 2 (two) times a week. 05/09/19   Doristine Bosworth, MD  levocetirizine (XYZAL) 5 MG tablet Take 5 mg by mouth every evening.    [provider]  LORazepam (ATIVAN) 1 MG tablet Take 1 tablet 30 minutes prior to leaving house on day of office surgery.  Bring second tablet with you to the office. Patient not taking: No sig reported 02/21/21   Sherren Kerns, MD  metroNIDAZOLE (FLAGYL) 500 MG tablet Take 1 tablet (500 mg total) by mouth 2 (two) times daily. 12/16/19   Doristine Bosworth, MD  metroNIDAZOLE (METROGEL) 0.75 % gel Apply 1 application topically 2 (two) times daily.    [provider]  montelukast (SINGULAIR) 10 MG tablet Take 1 tablet (10 mg total) by mouth at bedtime. 05/06/19   Doristine Bosworth, MD  omeprazole (PRILOSEC) 20 MG capsule Take 1 capsule (20 mg total) daily by mouth. 10/07/17   Collie Siad A, MD  ondansetron (ZOFRAN-ODT) 4 MG disintegrating tablet DISSOLVE 1 TABLET ON THE TONGUE EVERY 8 (EIGHT) HOURS AS NEEDED FOR NAUSEA FOR UP TO 7 DAYS. 08/17/19  [provider]  ondansetron (ZOFRAN-ODT) 4 MG disintegrating tablet Take 1 tablet (4 mg total) by mouth every 8 (eight) hours as needed for nausea or vomiting. 08/06/22   Mannie Stabile, PA-C  oseltamivir (TAMIFLU) 75 MG capsule Take 1 capsule (75 mg total) by mouth every 12 (twelve) hours. 12/05/21   Cardama, Amadeo Garnet, MD  Pancrelipase, Lip-Prot-Amyl, (CREON PO) Take by mouth daily.    [provider]  pantoprazole (PROTONIX) 40 MG tablet Take 40 mg by mouth daily.    [provider]  pseudoephedrine (SUDAFED 12 HOUR) 120 MG 12 hr tablet Take 1 tablet (120 mg total) by mouth 2 (two) times daily.  02/03/18   Wallis Bamberg, PA-C  sucralfate (CARAFATE) 1 g tablet Take 1 tablet (1 g total) by mouth daily as needed. 12/16/19 02/14/20  Doristine Bosworth, MD  triamcinolone (NASACORT) 55 MCG/ACT AERO nasal inhaler Place 2 sprays into the nose daily.    [provider]  Vitamin D, Ergocalciferol, (DRISDOL) 1.25 MG (50000 UNIT) CAPS capsule TAKE 1 CAPSULE (50,000 UNITS TOTAL) BY MOUTH EVERY 7 (SEVEN) DAYS. 02/06/20   Doristine Bosworth, MD      Allergies    Bee venom and Other    Review of Systems   Review of Systems  Gastrointestinal:  Positive for abdominal pain.    Physical Exam Updated Vital Signs BP 130/85   Pulse 65   Temp 98.4 F (36.9 C) (Oral)   Resp 15   Ht 5\' 3"  (1.6 m)   Wt 101.6 kg   SpO2 100%   BMI 39.68 kg/m  Physical Exam  ED Results / Procedures / Treatments   Labs (all labs ordered are listed, but only abnormal results are displayed) Labs Reviewed  COMPREHENSIVE METABOLIC PANEL - Abnormal; Notable for the following components:      Result Value   Calcium 8.4 (*)    All other components within normal limits  LIPASE, BLOOD - Abnormal; Notable for the following components:   Lipase 69 (*)    All other components within normal limits  CBC WITH DIFFERENTIAL/PLATELET  URINALYSIS, ROUTINE W REFLEX MICROSCOPIC  PREGNANCY, URINE    EKG None  Radiology CT ABDOMEN PELVIS W CONTRAST  Result Date: 11/28/2022 CLINICAL DATA:  Non localized abdominal pain. Tested positive for H pylori more than 14 days ago and finished all antibiotics with continued dull, cramping pain. Previous gastric sleeve surgery, revised in 2020. EXAM: CT ABDOMEN AND PELVIS WITH CONTRAST TECHNIQUE: Multidetector CT imaging of the abdomen and pelvis was performed using the standard protocol following bolus administration of intravenous contrast. RADIATION DOSE REDUCTION: This exam was performed according to the departmental dose-optimization program which includes automated exposure control,  adjustment of the mA and/or kV according to patient size and/or use of iterative reconstruction technique. CONTRAST:  29mL OMNIPAQUE IOHEXOL 300 MG/ML  SOLN COMPARISON:  None Available. FINDINGS: Lower chest: Unremarkable. Hepatobiliary: No focal liver abnormality is seen. Status post cholecystectomy. No biliary dilatation. Pancreas: Unremarkable. No pancreatic ductal dilatation or surrounding inflammatory changes. Spleen: Normal in size without focal abnormality. Adrenals/Urinary Tract: Adrenal glands are unremarkable. Kidneys are normal, without renal calculi, focal lesion, or hydronephrosis. Bladder is unremarkable. Stomach/Bowel: Post gastric sleeve changes. Unremarkable small bowel, colon and appendix. Vascular/Lymphatic: No significant vascular findings are present. No enlarged abdominal or pelvic lymph nodes. Reproductive: Uterus and bilateral adnexa are unremarkable. Intrauterine device in expected position. Other: Small umbilical hernia containing fat. Musculoskeletal: Mild lower thoracic spine degenerative changes. IMPRESSION: No  acute abnormality. Electronically Signed   By: Beckie Salts M.D.   On: 11/28/2022 23:04    Procedures Procedures  {Document cardiac monitor, telemetry assessment procedure when appropriate:1}  Medications Ordered in ED Medications  iohexol (OMNIPAQUE) 300 MG/ML solution 80 mL (80 mLs Intravenous Contrast Given 11/28/22 2252)  alum & mag hydroxide-simeth (MAALOX/MYLANTA) 200-200-20 MG/5ML suspension 30 mL (30 mLs Oral Given 11/28/22 2327)    And  lidocaine (XYLOCAINE) 2 % viscous mouth solution 15 mL (15 mLs Oral Given 11/28/22 2327)    ED Course/ Medical Decision Making/ A&P                           Medical Decision Making Amount and/or Complexity of Data Reviewed Labs: ordered. Radiology: ordered.  Risk OTC drugs. Prescription drug management.   ***  {Document critical care time when appropriate:1} {Document review of labs and clinical decision  tools ie heart score, Chads2Vasc2 etc:1}  {Document your independent review of radiology images, and any outside records:1} {Document your discussion with family members, caretakers, and with consultants:1} {Document social determinants of health affecting pt's care:1} {Document your decision making why or why not admission, treatments were needed:1} Final Clinical Impression(s) / ED Diagnoses Final diagnoses:  None    Rx / DC Orders ED Discharge Orders     None

## 2022-11-28 NOTE — Discharge Instructions (Addendum)
You were seen in the emergency room today for evaluation of your belly pain.  Your imaging was unremarkable.  Your lipase is mildly elevated however your CT scan does not show any signs of pancreatitis.  I am unsure what is causing this abdominal pain.  It could be your chronic abdominal pain or H. pylori.  Because of this, like you to continue with the Carafate and pantoprazole.  I included information for a gastroenterologist for you to call to schedule appointment with.  Otherwise, like for you to call your primary care doctor to reschedule reevaluation.  If you have any worsening pain, fever, black or bloody stool, nausea, or vomiting, please return to the nearest emergency department.  Otherwise, if you have any concerns, new or worsening symptoms, please return to the nurse or department for evaluation.  Contact a doctor if: Your belly pain changes or gets worse. You are not hungry, or you lose weight without trying. You are having trouble pooping (constipated) or have watery poop (diarrhea) for more than 2-3 days. You have pain when you pee or poop. Your belly pain wakes you up at night. Your pain gets worse with meals, after eating, or with certain foods. You are vomiting and cannot keep anything down. You have a fever. You have blood in your pee. Get help right away if: Your pain does not go away as soon as your doctor says it should. You cannot stop vomiting. Your pain is only in areas of your belly, such as the right side or the left lower part of the belly. You have bloody or black poop, or poop that looks like tar. You have very bad pain, cramping, or bloating in your belly. You have signs of not having enough fluid or water in your body (dehydration), such as: Dark pee, very little pee, or no pee. Cracked lips. Dry mouth. Sunken eyes. Sleepiness. Weakness. You have trouble breathing or chest pain.

## 2022-11-28 NOTE — ED Triage Notes (Signed)
Pt states was positive for H. 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

## 2022-11-29 NOTE — ED Notes (Signed)
Rx x 2 given  Written and verbal inst to pt  Verbalized an understanding  To home  

## 2023-01-30 ENCOUNTER — Institutional Professional Consult (permissible substitution): Payer: BC Managed Care – PPO | Admitting: Plastic Surgery

## 2023-02-25 ENCOUNTER — Ambulatory Visit: Payer: Self-pay | Admitting: Allergy

## 2023-04-02 ENCOUNTER — Ambulatory Visit: Payer: Self-pay | Admitting: Allergy

## 2023-05-13 ENCOUNTER — Ambulatory Visit: Payer: Self-pay | Admitting: Allergy

## 2023-06-02 ENCOUNTER — Other Ambulatory Visit: Payer: Self-pay | Admitting: Family Medicine

## 2023-06-02 DIAGNOSIS — Z1231 Encounter for screening mammogram for malignant neoplasm of breast: Secondary | ICD-10-CM

## 2023-07-07 ENCOUNTER — Ambulatory Visit: Admission: RE | Admit: 2023-07-07 | Payer: BC Managed Care – PPO | Source: Ambulatory Visit

## 2023-07-07 DIAGNOSIS — Z1231 Encounter for screening mammogram for malignant neoplasm of breast: Secondary | ICD-10-CM

## 2023-09-16 ENCOUNTER — Other Ambulatory Visit: Payer: Self-pay | Admitting: *Deleted

## 2023-09-16 DIAGNOSIS — R600 Localized edema: Secondary | ICD-10-CM

## 2023-09-18 ENCOUNTER — Ambulatory Visit (HOSPITAL_COMMUNITY)
Admission: RE | Admit: 2023-09-18 | Discharge: 2023-09-18 | Disposition: A | Discharge status: Home / Self Care | Payer: BC Managed Care – PPO | Source: Ambulatory Visit | Attending: Vascular Surgery

## 2023-09-18 DIAGNOSIS — R600 Localized edema: Secondary | ICD-10-CM | POA: Diagnosis present

## 2023-11-20 HISTORY — PX: PANNICULECTOMY: SUR1001

## 2023-12-11 ENCOUNTER — Other Ambulatory Visit: Payer: Self-pay | Admitting: Plastic Surgery

## 2023-12-11 ENCOUNTER — Other Ambulatory Visit: Payer: Self-pay

## 2023-12-11 DIAGNOSIS — Z9889 Other specified postprocedural states: Secondary | ICD-10-CM

## 2023-12-14 ENCOUNTER — Ambulatory Visit
Admission: RE | Admit: 2023-12-14 | Discharge: 2023-12-14 | Disposition: A | Discharge status: Home / Self Care | Payer: Self-pay | Source: Ambulatory Visit | Attending: Plastic Surgery | Admitting: Plastic Surgery

## 2023-12-14 DIAGNOSIS — Z9889 Other specified postprocedural states: Secondary | ICD-10-CM

## 2024-01-12 ENCOUNTER — Encounter (HOSPITAL_BASED_OUTPATIENT_CLINIC_OR_DEPARTMENT_OTHER): Payer: Self-pay

## 2024-01-12 ENCOUNTER — Other Ambulatory Visit (HOSPITAL_BASED_OUTPATIENT_CLINIC_OR_DEPARTMENT_OTHER): Payer: Self-pay

## 2024-01-12 ENCOUNTER — Emergency Department (HOSPITAL_BASED_OUTPATIENT_CLINIC_OR_DEPARTMENT_OTHER)
Admission: EM | Admit: 2024-01-12 | Discharge: 2024-01-12 | Disposition: A | Discharge status: Home / Self Care | Payer: 59 | Attending: Emergency Medicine | Admitting: Emergency Medicine

## 2024-01-12 ENCOUNTER — Emergency Department (HOSPITAL_BASED_OUTPATIENT_CLINIC_OR_DEPARTMENT_OTHER): Payer: 59

## 2024-01-12 ENCOUNTER — Other Ambulatory Visit: Payer: Self-pay

## 2024-01-12 DIAGNOSIS — J4531 Mild persistent asthma with (acute) exacerbation: Secondary | ICD-10-CM | POA: Insufficient documentation

## 2024-01-12 DIAGNOSIS — Z7952 Long term (current) use of systemic steroids: Secondary | ICD-10-CM | POA: Diagnosis not present

## 2024-01-12 DIAGNOSIS — R058 Other specified cough: Secondary | ICD-10-CM | POA: Diagnosis not present

## 2024-01-12 DIAGNOSIS — Z20822 Contact with and (suspected) exposure to covid-19: Secondary | ICD-10-CM | POA: Insufficient documentation

## 2024-01-12 DIAGNOSIS — I10 Essential (primary) hypertension: Secondary | ICD-10-CM | POA: Insufficient documentation

## 2024-01-12 DIAGNOSIS — R059 Cough, unspecified: Secondary | ICD-10-CM | POA: Diagnosis present

## 2024-01-12 LAB — CBC WITH DIFFERENTIAL/PLATELET
Abs Immature Granulocytes: 0.01 10*3/uL (ref 0.00–0.07)
Basophils Absolute: 0 10*3/uL (ref 0.0–0.1)
Basophils Relative: 1 %
Eosinophils Absolute: 0.2 10*3/uL (ref 0.0–0.5)
Eosinophils Relative: 7 %
HCT: 37.3 % (ref 36.0–46.0)
Hemoglobin: 12 g/dL (ref 12.0–15.0)
Immature Granulocytes: 0 %
Lymphocytes Relative: 25 %
Lymphs Abs: 0.8 10*3/uL (ref 0.7–4.0)
MCH: 30.1 pg (ref 26.0–34.0)
MCHC: 32.2 g/dL (ref 30.0–36.0)
MCV: 93.5 fL (ref 80.0–100.0)
Monocytes Absolute: 0.4 10*3/uL (ref 0.1–1.0)
Monocytes Relative: 13 %
Neutro Abs: 1.6 10*3/uL — ABNORMAL LOW (ref 1.7–7.7)
Neutrophils Relative %: 54 %
Platelets: 156 10*3/uL (ref 150–400)
RBC: 3.99 MIL/uL (ref 3.87–5.11)
RDW: 13.7 % (ref 11.5–15.5)
WBC: 3 10*3/uL — ABNORMAL LOW (ref 4.0–10.5)
nRBC: 0 % (ref 0.0–0.2)

## 2024-01-12 LAB — BASIC METABOLIC PANEL
Anion gap: 7 (ref 5–15)
BUN: 10 mg/dL (ref 6–20)
CO2: 25 mmol/L (ref 22–32)
Calcium: 8.2 mg/dL — ABNORMAL LOW (ref 8.9–10.3)
Chloride: 107 mmol/L (ref 98–111)
Creatinine, Ser: 0.53 mg/dL (ref 0.44–1.00)
GFR, Estimated: >60 mL/min (ref >60)
Glucose, Bld: 77 mg/dL (ref 70–99)
Potassium: 3.9 mmol/L (ref 3.5–5.1)
Sodium: 139 mmol/L (ref 135–145)

## 2024-01-12 LAB — RESP PANEL BY RT-PCR (RSV, FLU A&B, COVID)  RVPGX2
Influenza A by PCR: NEGATIVE
Influenza B by PCR: NEGATIVE
Resp Syncytial Virus by PCR: NEGATIVE
SARS Coronavirus 2 by RT PCR: NEGATIVE

## 2024-01-12 LAB — GROUP A STREP BY PCR: Group A Strep by PCR: NOT DETECTED

## 2024-01-12 MED ORDER — ALBUTEROL SULFATE (2.5 MG/3ML) 0.083% IN NEBU
INHALATION_SOLUTION | RESPIRATORY_TRACT | Status: AC
  Filled 2024-01-12: qty 6

## 2024-01-12 MED ORDER — MAGNESIUM SULFATE 2 GM/50ML IV SOLN
2.0000 g | Freq: Once | INTRAVENOUS | Status: AC
  Administered 2024-01-12: 2 g via INTRAVENOUS
  Filled 2024-01-12: qty 50

## 2024-01-12 MED ORDER — OXYMETAZOLINE HCL 0.05 % NA SOLN
1.0000 | Freq: Two times a day (BID) | NASAL | 0 refills | Status: AC
  Filled 2024-01-12: qty 30, 150d supply, fill #0

## 2024-01-12 MED ORDER — ALBUTEROL SULFATE (2.5 MG/3ML) 0.083% IN NEBU
5.0000 mg | INHALATION_SOLUTION | Freq: Once | RESPIRATORY_TRACT | Status: AC
  Administered 2024-01-12: 5 mg via RESPIRATORY_TRACT

## 2024-01-12 MED ORDER — IPRATROPIUM-ALBUTEROL 0.5-2.5 (3) MG/3ML IN SOLN
3.0000 mL | Freq: Once | RESPIRATORY_TRACT | Status: AC
  Administered 2024-01-12: 3 mL via RESPIRATORY_TRACT
  Filled 2024-01-12: qty 3

## 2024-01-12 MED ORDER — SODIUM CHLORIDE 0.9 % IV BOLUS
1000.0000 mL | Freq: Once | INTRAVENOUS | Status: AC
  Administered 2024-01-12: 1000 mL via INTRAVENOUS

## 2024-01-12 MED ORDER — PREDNISONE 20 MG PO TABS
40.0000 mg | ORAL_TABLET | Freq: Every day | ORAL | 0 refills | Status: AC
  Filled 2024-01-12: qty 20, 10d supply, fill #0

## 2024-01-12 NOTE — ED Provider Notes (Signed)
[facility]MENT AT [facility] Provider Note  CSN: [id number] Arrival date & time: 01/12/24 4098  Chief Complaint(s) Shortness of Breath  HPI Amber Daniels is a 43 y.o. female with past medical history as below, significant for asthma, GERD, obesity, hypertension, OSA who presents to the ED with complaint of cough, URI symptoms  She has been having the symptoms intermittently over the past 2 months.  She was treated with outpatient antibiotics, steroids, antitussive, still reports some ongoing cough, mild dyspnea.  She has history of asthma.  No nausea, vomiting, diarrhea, fevers or chills, no chest pain, she is tolerant p.o. difficulty.  Does not use home oxygen.  Past Medical History Past Medical History:  Diagnosis Date   Allergy    Anemia    during pregnancy    Arthritis    Asthma    Depression    Edema    lower extremities    GERD (gastroesophageal reflux disease)    Headache    occasional migraine    Heart murmur    Hypertension    Morbid obesity (HCC)    Osteoarthritis    PONV (postoperative nausea and vomiting)    Postpartum care following cesarean delivery (12/2) 11/01/2013   Prediabetes    Shortness of breath dyspnea    only asthma related    Sleep apnea    not on CPAP, dx 01/2013 with Outpatietn overnight study at home.    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 Authorizing Provider  oxymetazoline (AFRIN NASAL SPRAY) 0.05 % nasal spray Place 1 spray into both nostrils 2 (two) times daily for 3 days. 01/12/24 01/15/24 Yes Sloan Leiter, DO  predniSONE (DELTASONE) 20 MG tablet Take 2 tablets (40 mg total) by mouth daily for 10 days. 01/12/24 01/22/24 Yes Sloan Leiter, DO  acetaminophen (TYLENOL) 500 MG tablet Take 1,500 mg by mouth every 6 (six) hours as needed. For pain.    [provider]  albuterol (VENTOLIN HFA) 108 (90 Base) MCG/ACT inhaler Inhale 2 puffs into the lungs every 6 (six) hours as needed for wheezing or shortness of breath. 05/06/19   Doristine Bosworth, MD  B Complex Vitamins (VITAMIN B-COMPLEX) TABS Take by mouth.    [provider]  beclomethasone (QVAR) 40 MCG/ACT inhaler Inhale 2 puffs into the lungs daily. 05/06/19   Doristine Bosworth, MD  cetirizine (ZYRTEC) 10 MG tablet Take 10 mg by mouth daily.    [provider]  citalopram (CELEXA) 40 MG tablet Take 0.5 tablets (20 mg total) by mouth daily. 06/21/19   Corum, Minerva Fester, MD  ELIQUIS 2.5 MG TABS tablet Take 2.5  mg by mouth 2 (two) times daily. Patient not taking: Reported on 02/27/2021 08/20/19   [provider]  fexofenadine (ALLEGRA) 180 MG tablet Take 180 mg by mouth daily.    [provider]  fluconazole (DIFLUCAN) 150 MG tablet Take 1 tablet (150 mg total) by mouth daily as needed (take at least 3 days between doses as needed for vaginal irritaiton). 06/21/19   Wandra Feinstein, MD  Fluocinolone Acetonide Scalp 0.01 % OIL Apply topically to affected areas 05/06/19   Collie Siad A, MD  furosemide (LASIX) 20 MG tablet Take 1 tablet (20 mg total) by mouth  daily as needed. 12/16/19   Doristine Bosworth, MD  HYDROcodone bit-homatropine (HYCODAN) 5-1.5 MG/5ML syrup Take 5 mLs by mouth every 6 (six) hours as needed for cough. 08/06/22   Mannie Stabile, PA-C  HYDROcodone-acetaminophen (HYCET) 7.5-325 mg/15 ml solution TAKE 15 ML (7.5 MG OF HYDROCODONE TOTAL) BY MOUTH EVERY 4 (FOUR) HOURS AS NEEDED FOR UP TO 7 DAYS. 08/17/19   [provider]  ketoconazole (NIZORAL) 2 % shampoo Apply 1 application topically 2 (two) times a week. 05/09/19   Doristine Bosworth, MD  levocetirizine (XYZAL) 5 MG tablet Take 5 mg by mouth every evening.    [provider]  LORazepam (ATIVAN) 1 MG tablet Take 1 tablet 30 minutes prior to leaving house on day of office surgery.  Bring second tablet with you to the office. Patient not taking: No sig reported 02/21/21   Sherren Kerns, MD  metroNIDAZOLE (FLAGYL) 500 MG tablet Take 1 tablet (500 mg total) by mouth 2 (two) times daily. 12/16/19   Doristine Bosworth, MD  metroNIDAZOLE (METROGEL) 0.75 % gel Apply 1 application topically 2 (two) times daily.    [provider]  montelukast (SINGULAIR) 10 MG tablet Take 1 tablet (10 mg total) by mouth at bedtime. 05/06/19   Collie Siad A, MD  ondansetron (ZOFRAN-ODT) 4 MG disintegrating tablet DISSOLVE 1 TABLET ON THE TONGUE EVERY 8 (EIGHT) HOURS AS NEEDED FOR NAUSEA FOR UP TO 7 DAYS. 08/17/19   [provider]  ondansetron (ZOFRAN-ODT) 4 MG disintegrating tablet Take 1 tablet (4 mg total) by mouth every 8 (eight) hours as needed for nausea or vomiting. 08/06/22   Mannie Stabile, PA-C  oseltamivir (TAMIFLU) 75 MG capsule Take 1 capsule (75 mg total) by mouth every 12 (twelve) hours. 12/05/21   Cardama, Amadeo Garnet, MD  Pancrelipase, Lip-Prot-Amyl, (CREON PO) Take by mouth daily.    [provider]  pantoprazole (PROTONIX) 20 MG tablet Take 1 tablet (20 mg total) by mouth daily. 11/28/22   Achille Rich, PA-C  pseudoephedrine (SUDAFED 12 HOUR) 120 MG  12 hr tablet Take 1 tablet (120 mg total) by mouth 2 (two) times daily. 02/03/18   Wallis Bamberg, PA-C  sucralfate (CARAFATE) 1 g tablet Take 1 tablet (1 g total) by mouth daily as needed. 12/16/19 02/14/20  Doristine Bosworth, MD  sucralfate (CARAFATE) 1 g tablet Take 1 tablet (1 g total) by mouth with breakfast, with lunch, and with evening meal for 14 days. 11/28/22 12/12/22  Achille Rich, PA-C  triamcinolone (NASACORT) 55 MCG/ACT AERO nasal inhaler Place 2 sprays into the nose daily.    [provider]  Vitamin D, Ergocalciferol, (DRISDOL) 1.25 MG (50000 UNIT) CAPS capsule TAKE 1 CAPSULE (50,000 UNITS TOTAL) BY MOUTH EVERY 7 (SEVEN) DAYS. 02/06/20   Doristine Bosworth, MD  Past Surgical History Past Surgical History:  Procedure Laterality Date   BREATH TEK H PYLORI N/A 02/22/2015   Procedure: BREATH TEK H PYLORI;  Surgeon: Luretha Murphy, MD;  Location: [facility];  Service: General;  Laterality: N/A;   CESAREAN SECTION     CESAREAN SECTION N/A 11/01/2013   Procedure: REPEAT CESAREAN SECTION;  Surgeon: Serita Kyle, MD;  Location: [facility];  Service: Obstetrics;  Laterality: N/A;  EDD: 11/04/13   CHOLECYSTECTOMY     LAPAROSCOPIC GASTRIC SLEEVE RESECTION N/A 06/25/2015   Procedure: LAPAROSCOPIC GASTRIC SLEEVE RESECTION;  Surgeon: Luretha Murphy, MD;  Location: [facility];  Service: General;  Laterality: N/A;   stab phlebcetomy  Left 01/31/2021   stab 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 by Fabienne Bruns MD    WISDOM TOOTH EXTRACTION     Family History Family History  Problem Relation Age of Onset   Diabetes Mother    Hypertension Mother    Depression Mother    Anxiety disorder Mother    Sleep apnea Mother    Obesity Mother    Diabetes Father    Hypertension Father    Hyperlipidemia Father    Heart disease Father        cardiomegaly, CHF   Anxiety  disorder Father    Obesity Father    Cancer Father 38       lung cancer   Mental illness Father    Hypertension Brother    Diabetes Brother    Hypertension Maternal Grandmother    Hypertension Maternal Grandfather    Hypertension Sister     Social History Social History   Tobacco Use   Smoking status: Never   Smokeless tobacco: Never  Vaping Use   Vaping status: Never Used  Substance Use Topics   Alcohol use: Yes    Alcohol/week: 7.0 standard drinks of alcohol    Types: 7 Glasses of wine per week    Comment: weekly   Drug use: No   Allergies Bee venom and Other  Review of Systems Review of Systems  Constitutional:  Negative for chills and fever.  Respiratory:  Positive for cough and shortness of breath.   Gastrointestinal:  Negative for abdominal pain and nausea.  Genitourinary:  Negative for dysuria.  Musculoskeletal:  Negative for arthralgias.  Neurological:  Negative for light-headedness.  All other systems reviewed and are negative.   Physical Exam Vital Signs  I have reviewed the triage vital signs BP 125/80 (BP Location: Right Arm)   Pulse 78   Temp 98.4 F (36.9 C) (Oral)   Resp 13   LMP  (LMP Unknown)   SpO2 100%  Physical Exam Vitals and nursing note reviewed.  Constitutional:      General: She is not in acute distress.    Appearance: Normal appearance. She is well-developed. She is obese. She is not ill-appearing or diaphoretic.  HENT:     Head: Normocephalic and atraumatic.     Right Ear: External ear normal.     Left Ear: External ear normal.     Nose: Nose normal.     Mouth/Throat:     Mouth: Mucous membranes are moist.  Eyes:     General: No scleral icterus.       Right eye: No discharge.        Left eye: No discharge.  Cardiovascular:     Rate and Rhythm: Normal rate and regular rhythm.     Pulses: Normal pulses.     Heart sounds:  Normal heart sounds.  Pulmonary:     Effort: Pulmonary effort is normal. No tachypnea, accessory  muscle usage or respiratory distress.     Breath sounds: No stridor. Wheezing present.  Abdominal:     General: Abdomen is flat. There is no distension.     Palpations: Abdomen is soft.     Tenderness: There is no abdominal tenderness.  Musculoskeletal:     Cervical back: No rigidity.     Right lower leg: No edema.     Left lower leg: No edema.  Skin:    General: Skin is warm and dry.     Capillary Refill: Capillary refill takes less than 2 seconds.  Neurological:     Mental Status: She is alert.  Psychiatric:        Mood and Affect: Mood normal.        Behavior: Behavior normal. Behavior is cooperative.     ED Results and Treatments Labs (all labs ordered are listed, but only abnormal results are displayed) Labs Reviewed  BASIC METABOLIC PANEL - Abnormal; Notable for the following components:      Result Value   Calcium 8.2 (*)    All other components within normal limits  CBC WITH DIFFERENTIAL/PLATELET - Abnormal; Notable for the following components:   WBC 3.0 (*)    Neutro Abs 1.6 (*)    All other components within normal limits  RESP PANEL BY RT-PCR (RSV, FLU A&B, COVID)  RVPGX2  GROUP A STREP BY PCR                                                                                                                          Radiology DG Chest Portable 1 View Result Date: 01/12/2024 CLINICAL DATA:  43 year old female with history of cough and chest pain. Shortness of breath. Sore throat. EXAM: PORTABLE CHEST 1 VIEW COMPARISON:  Chest x-ray 05/31/2023. FINDINGS: Lung volumes are normal. No consolidative airspace disease. No pleural effusions. No pneumothorax. No pulmonary nodule or mass noted. Pulmonary vasculature and the cardiomediastinal silhouette are within normal limits. IMPRESSION: No radiographic evidence of acute cardiopulmonary disease. Electronically Signed   By: Trudie Reed M.D.   On: 01/12/2024 07:46    Pertinent labs & imaging results that were available  during my care of the patient were reviewed by me and considered in my medical decision making (see MDM for details).  Medications Ordered in ED Medications  albuterol (PROVENTIL) (2.5 MG/3ML) 0.083% nebulizer solution (  Not Given 01/12/24 1008)  ipratropium-albuterol (DUONEB) 0.5-2.5 (3) MG/3ML nebulizer solution 3 mL (3 mLs Nebulization Given 01/12/24 0741)  magnesium sulfate IVPB 2 g 50 mL (0 g Intravenous Stopped 01/12/24 0925)  sodium chloride 0.9 % bolus 1,000 mL (0 mLs Intravenous Stopped 01/12/24 0920)  albuterol (PROVENTIL) (2.5 MG/3ML) 0.083% nebulizer solution 5 mg (5 mg Nebulization Given 01/12/24 0956)  Procedures Procedures  (including critical care time)  Medical Decision Making / ED Course    Medical Decision Making:    Reesa Gotschall is a 43 y.o. female with past medical history as below, significant for asthma, GERD, obesity, hypertension, OSA who presents to the ED with complaint of cough, URI symptoms. The complaint involves an extensive differential diagnosis and also carries with it a high risk of complications and morbidity.  Serious etiology was considered. Ddx includes but is not limited to: In my evaluation of this patient's dyspnea my DDx includes, but is not limited to, pneumonia, pulmonary embolism, pneumothorax, pulmonary edema, metabolic acidosis, asthma, COPD, cardiac cause, anemia, anxiety, etc.    Complete initial physical exam performed, notably the patient was in no distress, no hypoxia, wheezing noted.    Reviewed and confirmed nursing documentation for past medical history, family history, social history.  Vital signs reviewed.    Clinical Course as of 01/12/24 1139  Tue Jan 12, 2024  0901 Feeling better after nebs [SG]  [phone] Feels as though her lungs are "tight."  Minimal wheezing on auscultation, will repeat breathing  treatment [SG]  1058 Feeling better on recheck, no hypoxia, no wheezing [SG]    Clinical Course User Index [SG] Sloan Leiter, DO    Brief summary: 43 year old female history as above here with ongoing cough, dyspnea, URI symptoms.  Labs reviewed, these are stable, chest x-ray without acute infiltrate, EKG nonischemic.  She is not hypoxic, no ongoing tachypnea or increased work of breathing.  She has trace wheezing since improved.  She has ongoing nonproductive cough, favor postviral cough.    Recommend Afrin for the next 3 days, will do steroid as well.  Follow-up with PCP.  She has no hypoxia, tachypnea or increased work of breathing  The patient improved significantly and was discharged in stable condition. Detailed discussions were had with the patient/guardian regarding current findings, and need for close f/u with PCP or on call doctor. The patient/guardian has been instructed to return immediately if the symptoms worsen in any way for re-evaluation. Patient/guardian verbalized understanding and is in agreement with current care plan. All questions answered prior to discharge.                Additional history obtained: -Additional history obtained from na -External records from outside source obtained and reviewed including: Chart review including previous notes, labs, imaging, consultation notes including  Prior ED visits, home medications, prior labs and imaging   Lab Tests: -I ordered, reviewed, and interpreted labs.   The pertinent results include:   Labs Reviewed  BASIC METABOLIC PANEL - Abnormal; Notable for the following components:      Result Value   Calcium 8.2 (*)    All other components within normal limits  CBC WITH DIFFERENTIAL/PLATELET - Abnormal; Notable for the following components:   WBC 3.0 (*)    Neutro Abs 1.6 (*)    All other components within normal limits  RESP PANEL BY RT-PCR (RSV, FLU A&B, COVID)  RVPGX2  GROUP A STREP BY PCR     Notable for stable labs  EKG   EKG Interpretation Date/Time:  Tuesday January 12 2024 06:49:17 EST Ventricular Rate:  85 PR Interval:  181 QRS Duration:  101 QT Interval:  376 QTC Calculation: 448 R Axis:   82  Text Interpretation: Sinus rhythm Anterior infarct, old LOW VOLTAGE When compared with ECG of 03/16/2022, No significant change was found Confirmed by Dione Booze ([id number]) on 01/12/2024  6:53:12 AM         Imaging Studies ordered: I ordered imaging studies including CXR I independently visualized the following imaging with scope of interpretation limited to determining acute life threatening conditions related to emergency care; findings noted above I independently visualized and interpreted imaging. I agree with the radiologist interpretation   Medicines ordered and prescription drug management: Meds ordered this encounter  Medications   ipratropium-albuterol (DUONEB) 0.5-2.5 (3) MG/3ML nebulizer solution 3 mL   magnesium sulfate IVPB 2 g 50 mL   sodium chloride 0.9 % bolus 1,000 mL   albuterol (PROVENTIL) (2.5 MG/3ML) 0.083% nebulizer solution 5 mg   albuterol (PROVENTIL) (2.5 MG/3ML) 0.083% nebulizer solution    Evans, Tricia A: cabinet override   predniSONE (DELTASONE) 20 MG tablet    Sig: Take 2 tablets (40 mg total) by mouth daily for 10 days.    Dispense:  20 tablet    Refill:  0   oxymetazoline (AFRIN NASAL SPRAY) 0.05 % nasal spray    Sig: Place 1 spray into both nostrils 2 (two) times daily for 3 days.    Dispense:  15 mL    Refill:  0    -I have reviewed the patients home medicines and have made adjustments as needed   Consultations Obtained: na   Cardiac Monitoring: Continuous pulse oximetry interpreted by myself, 99% on RA.    Social Determinants of Health:  Diagnosis or treatment significantly limited by social determinants of health: obesity   Reevaluation: After the interventions noted above, I reevaluated the patient and found that  they have improved  Co morbidities that complicate the patient evaluation  Past Medical History:  Diagnosis Date   Allergy    Anemia    during pregnancy    Arthritis    Asthma    Depression    Edema    lower extremities    GERD (gastroesophageal reflux disease)    Headache    occasional migraine    Heart murmur    Hypertension    Morbid obesity (HCC)    Osteoarthritis    PONV (postoperative nausea and vomiting)    Postpartum care following cesarean delivery (12/2) 11/01/2013   Prediabetes    Shortness of breath dyspnea    only asthma related    Sleep apnea    not on CPAP, dx 01/2013 with Outpatietn overnight study at home.    Swelling       Dispostion: Disposition decision including need for hospitalization was considered, and patient discharged from emergency department.    Final Clinical Impression(s) / ED Diagnoses Final diagnoses:  Mild persistent asthma with exacerbation  Post-viral cough syndrome        Sloan Leiter, DO 01/12/24 1139

## 2024-01-12 NOTE — ED Provider Notes (Signed)
MSE was initiated and I personally evaluated the patient and placed orders (if any) at  6:53 AM on January 12, 2024.  The patient appears stable so that the remainder of the MSE may be completed by another provider.  She Zentz with ongoing chest pain, cough, shortness of breath.  Symptoms have been present for several months and have not improved despite several courses of antibiotics and steroids.  On exam, she has coarse expiratory wheezes throughout.  I have ordered nebulizer treatment as well as chest x-ray and screening labs.   Dione Booze, MD 01/12/24 [phone]

## 2024-01-12 NOTE — ED Triage Notes (Addendum)
Pt POV from home -  has been fighting a URI / "walking PNA" since December 20.  Pt has been on 2 ABX and still not better with congestion, Has, cough and Chest pain and sore throat.

## 2024-01-12 NOTE — Discharge Instructions (Addendum)
It was a pleasure caring for you today in the emergency department.  Consider using a humidifier in your bedroom.  Drink plenty of liquids, get plenty rest.  Continue using your home nebulizer and inhaler as directed.  Follow-up with your PCP.   Avoid tobacco or other asthma triggers.  Please return to the emergency department for any worsening or worrisome symptoms.

## 2024-01-12 NOTE — ED Notes (Signed)
Pt given discharge instructions and reviewed prescriptions. Opportunities given for questions. Pt verbalizes understanding. PIV removed x1. Jillyn Hidden, RN

## 2024-03-02 ENCOUNTER — Ambulatory Visit: Payer: 59 | Admitting: Pulmonary Disease

## 2024-03-02 ENCOUNTER — Encounter: Payer: Self-pay | Admitting: Pulmonary Disease

## 2024-03-02 VITALS — BP 107/75 | HR 64 | Temp 98.0°F | Ht 63.0 in | Wt 189.0 lb

## 2024-03-02 DIAGNOSIS — J4541 Moderate persistent asthma with (acute) exacerbation: Secondary | ICD-10-CM | POA: Diagnosis not present

## 2024-03-02 MED ORDER — BUDESONIDE-FORMOTEROL FUMARATE 160-4.5 MCG/ACT IN AERO: 2.0000 | INHALATION_SPRAY | Freq: Two times a day (BID) | RESPIRATORY_TRACT | 11 refills | Status: AC

## 2024-03-02 NOTE — Progress Notes (Signed)
 Amber Daniels    [id number]    1981/08/16  Primary Care Physician:[name], Jocelyn Lamer, MD  Referring Physician: Leilani Able, MD [address] [place],  [place] [address]  Chief complaint:  Consult for asthma  HPI: 43 y.o. who  has a past medical history of Allergy, Anemia, Arthritis, Asthma, Depression, Edema, GERD (gastroesophageal reflux disease), Headache, Heart murmur, Hypertension, Morbid obesity (HCC), Osteoarthritis, PONV (postoperative nausea and vomiting), Postpartum care following cesarean delivery (12/2) (11/01/2013), Prediabetes, Shortness of breath dyspnea, Sleep apnea, and Swelling.   Discussed the use of AI scribe software for clinical note transcription with the patient, who gave verbal consent to proceed.  History of Present Illness Amber Daniels is a 43 year old female with asthma who presents with worsening respiratory symptoms and congestion.  Since October 2024, she has experienced worsening respiratory symptoms, initially attributing them to allergies due to weather changes. Despite adjusting her allergy medications, her condition did not improve. In December, she underwent surgery and subsequently developed worsening symptoms after Christmas, leading to a diagnosis of walking pneumonia and an upper respiratory infection at [facility]. In February, she visited the emergency room at [facility] due to severe breathing difficulties and wheezing. She received breathing treatments every four hours, which provided temporary relief. However, her symptoms persisted, leading to a second round of prednisone and antibiotics. She remains congested, attributing some of her symptoms to pollen exposure.  Her asthma history dates back to the 44s, diagnosed around the age of 75 or 52. She was previously on a steroid inhaler but discontinued it when her asthma was under control. She has not been on a regular inhaler regimen recently, using medications as needed. She has been on  Singulair for years and uses Flonase for her allergies.  Her family history includes her father, who had COPD, emphysema, and died of lung cancer. 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 She has two children, aged 7 and 66, who also experience allergy symptoms.  She reports congestion and wheezing, but denies current wheezing during the visit. No current use of a steroid inhaler, previously used as needed.   Pets: No pets Occupation: Financial controller at [employer] Exposures: No mold, hot tub, Jacuzzi.  No feather pillows or comforters Smoking history: Used to smoke in high school Travel history: No significant travel history Relevant family history: Father had COPD and emphysema.  He was a smoker.  Outpatient Encounter Medications as of 03/02/2024  Medication Sig   acetaminophen (TYLENOL) 500 MG tablet Take 1,500 mg by mouth every 6 (six) hours as needed. For pain.   albuterol (VENTOLIN HFA) 108 (90 Base) MCG/ACT inhaler Inhale 2 puffs into the lungs every 6 (six) hours as needed for wheezing or shortness of breath.   B Complex Vitamins (VITAMIN B-COMPLEX) TABS Take by mouth.   budesonide-formoterol (SYMBICORT) 160-4.5 MCG/ACT inhaler Inhale 2 puffs into the lungs 2 times daily at 12 noon and 4 pm.   cetirizine (ZYRTEC) 10 MG tablet Take 10 mg by mouth daily.   fexofenadine (ALLEGRA) 180 MG tablet Take 180 mg by mouth daily.   fluconazole (DIFLUCAN) 150 MG tablet Take 1 tablet (150 mg total) by mouth daily as needed (take at least 3 days between doses as needed for vaginal irritaiton).   Fluocinolone Acetonide Scalp 0.01 % OIL Apply topically to affected areas   furosemide (LASIX) 20 MG tablet Take 1 tablet (20  mg total) by mouth daily as needed.   ketoconazole (NIZORAL) 2 % shampoo Apply 1 application topically 2 (two) times a week.   levocetirizine (XYZAL) 5 MG tablet Take 5 mg by mouth every evening.   LORazepam (ATIVAN) 1 MG  tablet Take 1 tablet 30 minutes prior to leaving house on day of office surgery.  Bring second tablet with you to the office.   montelukast (SINGULAIR) 10 MG tablet Take 1 tablet (10 mg total) by mouth at bedtime.   Multiple Vitamins-Minerals (BARIATRIC MULTIVITAMINS PO) Take 1 capsule by mouth daily.   ondansetron (ZOFRAN-ODT) 4 MG disintegrating tablet DISSOLVE 1 TABLET ON THE TONGUE EVERY 8 (EIGHT) HOURS AS NEEDED FOR NAUSEA FOR UP TO 7 DAYS.   ondansetron (ZOFRAN-ODT) 4 MG disintegrating tablet Take 1 tablet (4 mg total) by mouth every 8 (eight) hours as needed for nausea or vomiting.   oxymetazoline (AFRIN NASAL SPRAY) 0.05 % nasal spray Place 1 spray into both nostrils 2 (two) times daily for 3 days.   pantoprazole (PROTONIX) 20 MG tablet Take 1 tablet (20 mg total) by mouth daily.   pseudoephedrine (SUDAFED 12 HOUR) 120 MG 12 hr tablet Take 1 tablet (120 mg total) by mouth 2 (two) times daily.   Vitamin D, Ergocalciferol, (DRISDOL) 1.25 MG (50000 UNIT) CAPS capsule TAKE 1 CAPSULE (50,000 UNITS TOTAL) BY MOUTH EVERY 7 (SEVEN) DAYS.   citalopram (CELEXA) 40 MG tablet Take 0.5 tablets (20 mg total) by mouth daily. (Patient not taking: Reported on 03/02/2024)   ELIQUIS 2.5 MG TABS tablet Take 2.5 mg by mouth 2 (two) times daily. (Patient not taking: Reported on 03/02/2024)   HYDROcodone bit-homatropine (HYCODAN) 5-1.5 MG/5ML syrup Take 5 mLs by mouth every 6 (six) hours as needed for cough. (Patient not taking: Reported on 03/02/2024)   HYDROcodone-acetaminophen (HYCET) 7.5-325 mg/15 ml solution TAKE 15 ML (7.5 MG OF HYDROCODONE TOTAL) BY MOUTH EVERY 4 (FOUR) HOURS AS NEEDED FOR UP TO 7 DAYS. (Patient not taking: Reported on 03/02/2024)   metroNIDAZOLE (FLAGYL) 500 MG tablet Take 1 tablet (500 mg total) by mouth 2 (two) times daily. (Patient not taking: Reported on 03/02/2024)   metroNIDAZOLE (METROGEL) 0.75 % gel Apply 1 application topically 2 (two) times daily. (Patient not taking: Reported on 03/02/2024)    oseltamivir (TAMIFLU) 75 MG capsule Take 1 capsule (75 mg total) by mouth every 12 (twelve) hours. (Patient not taking: Reported on 03/02/2024)   Pancrelipase, Lip-Prot-Amyl, (CREON PO) Take by mouth daily. (Patient not taking: Reported on 03/02/2024)   sucralfate (CARAFATE) 1 g tablet Take 1 tablet (1 g total) by mouth daily as needed.   sucralfate (CARAFATE) 1 g tablet Take 1 tablet (1 g total) by mouth with breakfast, with lunch, and with evening meal for 14 days.   triamcinolone (NASACORT) 55 MCG/ACT AERO nasal inhaler Place 2 sprays into the nose daily. (Patient not taking: Reported on 03/02/2024)   [DISCONTINUED] beclomethasone (QVAR) 40 MCG/ACT inhaler Inhale 2 puffs into the lungs daily. (Patient not taking: Reported on 03/02/2024)   No facility-administered encounter medications on file as of 03/02/2024.    Allergies as of 03/02/2024 - Review Complete 03/02/2024  Allergen Reaction Noted   Bee venom  05/16/2019   Other  12/18/2014    Past Medical History:  Diagnosis Date   Allergy    Anemia    during pregnancy    Arthritis    Asthma    Depression    Edema    lower extremities  GERD (gastroesophageal reflux disease)    Headache    occasional migraine    Heart murmur    Hypertension    Morbid obesity (HCC)    Osteoarthritis    PONV (postoperative nausea and vomiting)    Postpartum care following cesarean delivery (12/2) 11/01/2013   Prediabetes    Shortness of breath dyspnea    only asthma related    Sleep apnea    not on CPAP, dx 01/2013 with Outpatietn overnight study at home.    Swelling     Past Surgical History:  Procedure Laterality Date   BREATH TEK H PYLORI N/A 02/22/2015   Procedure: BREATH TEK H PYLORI;  Surgeon: Luretha Murphy, MD;  Location: [facility];  Service: General;  Laterality: N/A;   CESAREAN SECTION     CESAREAN SECTION N/A 11/01/2013   Procedure: REPEAT CESAREAN SECTION;  Surgeon: Serita Kyle, MD;  Location: [facility];  Service: Obstetrics;   Laterality: N/A;  EDD: 11/04/13   CHOLECYSTECTOMY     LAPAROSCOPIC GASTRIC SLEEVE RESECTION N/A 06/25/2015   Procedure: LAPAROSCOPIC GASTRIC SLEEVE RESECTION;  Surgeon: Luretha Murphy, MD;  Location: [facility];  Service: General;  Laterality: N/A;   PANNICULECTOMY  11/20/2023   stab phlebcetomy  Left 01/31/2021   stab 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 by Fabienne Bruns MD    WISDOM TOOTH EXTRACTION      Family History  Problem Relation Age of Onset   Diabetes Mother    Hypertension Mother    Depression Mother    Anxiety disorder Mother    Sleep apnea Mother    Obesity Mother    Diabetes Father    Hypertension Father    Hyperlipidemia Father    Heart disease Father        cardiomegaly, CHF   Anxiety disorder Father    Obesity Father    Cancer Father 78       lung cancer   Mental illness Father    Hypertension Brother    Diabetes Brother    Hypertension Maternal Grandmother    Hypertension Maternal Grandfather    Hypertension Sister     Social History   Socioeconomic History   Marital status: Single    Spouse name: Not on file   Number of children: 2   Years of education: Not on file   Highest education level: Not on file  Occupational History   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  Other Topics Concern   Not on file  Social History Narrative   Caffeine none.  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     Social Drivers of Corporate investment banker Strain: Not on file  Food Insecurity: No Food Insecurity (11/26/2023)   Received from [facility]   Hunger Vital Sign    Worried About Running Out of Food in the  Last Year: Never true    Ran Out of Food in the Last Year: Never true  Transportation Needs: No Transportation Needs (11/26/2023)   Received from [facility]   PRAPARE -  Transportation    In the past 12 months, has lack of transportation kept you from medical appointments or from getting medications?: No    Lack of Transportation (Non-Medical): No  Physical Activity: Not on file  Stress: Not on file  Social Connections: Not on file  Intimate Partner Violence: Not on file    Review of systems: Review of Systems  Constitutional: Negative for fever and chills.  HENT: Negative.   Eyes: Negative for blurred vision.  Respiratory: as per HPI  Cardiovascular: Negative for chest pain and palpitations.  Gastrointestinal: Negative for vomiting, diarrhea, blood per rectum. Genitourinary: Negative for dysuria, urgency, frequency and hematuria.  Musculoskeletal: Negative for myalgias, back pain and joint pain.  Skin: Negative for itching and rash.  Neurological: Negative for dizziness, tremors, focal weakness, seizures and loss of consciousness.  Endo/Heme/Allergies: Negative for environmental allergies.  Psychiatric/Behavioral: Negative for depression, suicidal ideas and hallucinations.  All other systems reviewed and are negative.  Physical Exam: Blood pressure 107/75, pulse 64, temperature 98 F (36.7 C), temperature source Oral, height 5\' 3"  (1.6 m), weight 189 lb (85.7 kg), SpO2 99%. Gen:      No acute distress HEENT:  EOMI, sclera anicteric Neck:     No masses; no thyromegaly Lungs:    Clear to auscultation bilaterally; normal respiratory effort CV:         Regular rate and rhythm; no murmurs Abd:      + bowel sounds; soft, non-tender; no palpable masses, no distension Ext:    No edema; adequate peripheral perfusion Skin:      Warm and dry; no rash Neuro: alert and oriented x 3 Psych: normal mood and affect  Data Reviewed: Imaging: CT abdomen pelvis  11/28/2022-visualized lungs are unremarkable Chest x-ray 01/12/2024-no acute cardiopulmonary abnormality I reviewed the images personally.  PFTs: 04/04/2015 FVC 2.84, FEV1 2.47 [96%), F/F85 Normal test  Labs: CBC 01/11/2024-WBC 3.0, eos 7%, absolute eosinophil count 210 Assessment & Plan Asthma exacerbation Asthma, previously well-controlled without a daily steroid inhaler, has recently worsened due to seasonal allergies and an upper respiratory infection. She required ER visits for dyspnea and wheezing, treated with two courses of prednisone and antibiotics. Currently, she is not wheezing, so additional prednisone is unnecessary. Initiating a regular inhaler with a steroid and bronchodilator is recommended for long-term management. Symbicort is preferred, with alternatives considered if not covered by insurance.  - Prescribe Symbicort inhaler, 2 puffs in the morning and 2 puffs in the evening. - Instruct to rinse mouth after using the inhaler. - Order lung function test and blood test in 2-3 months to reassess asthma control and allergy status.  Allergic rhinitis Significant congestion and allergy symptoms are exacerbated by the current pollen season. She is on Singulair, Flonase, and Neoral D for allergy management. Continuing these medications during the pollen season is crucial. Environmental control measures to reduce allergen exposure were discussed, including wearing a mask during outdoor activities, washing sheets regularly, leaving shoes outside, and changing clothes after being outdoors. - Continue Singulair, Flonase, and Neoral D for allergy management. - Advise on environmental control measures: wear a mask during outdoor activities, wash sheets regularly, leave shoes outside, and change clothes after being outdoors.  Recommendations: Start Symbicort Continue Singulair, Flonase, antihistamine PFTs in 3 months.  Chilton Greathouse MD [facility] 03/02/2024,  9:12 AM  CC: Leilani Able, MD

## 2024-03-02 NOTE — Patient Instructions (Signed)
 VISIT SUMMARY:  Today, we discussed your worsening respiratory symptoms and congestion, which have been ongoing since October. You have a history of asthma and allergies, and recent events have exacerbated your condition. We reviewed your current medications and made adjustments to better manage your symptoms.  YOUR PLAN:  -ASTHMA EXACERBATION: Asthma is a condition where your airways become inflamed and narrow, making it hard to breathe. Your asthma has worsened recently due to allergies and an upper respiratory infection. We are starting you on a regular inhaler with a steroid and bronchodilator called Symbicort to help manage your symptoms long-term. Please take 2 puffs in the morning and 2 puffs in the evening, and remember to rinse your mouth after using the inhaler. We will reassess your asthma control with a lung function test and blood test in 2-3 months.  -ALLERGIC RHINITIS: Allergic rhinitis is an allergic reaction that causes sneezing, congestion, and a runny nose. Your symptoms are worse due to the current pollen season. Continue taking Singulair, Flonase, and Neoral D as prescribed. To help reduce your exposure to allergens, wear a mask during outdoor activities, wash your sheets regularly, leave your shoes outside, and change clothes after being outdoors.  INSTRUCTIONS:  Please follow up in 2-3 months for a lung function test and blood test to reassess your asthma control and allergy status.

## 2024-03-14 ENCOUNTER — Encounter: Payer: Self-pay | Admitting: Allergy

## 2024-03-14 ENCOUNTER — Ambulatory Visit (INDEPENDENT_AMBULATORY_CARE_PROVIDER_SITE_OTHER): Admitting: Allergy

## 2024-03-14 VITALS — BP 128/82 | HR 77 | Temp 98.3°F | Ht 63.0 in | Wt 186.0 lb

## 2024-03-14 DIAGNOSIS — J328 Other chronic sinusitis: Secondary | ICD-10-CM | POA: Diagnosis not present

## 2024-03-14 DIAGNOSIS — K219 Gastro-esophageal reflux disease without esophagitis: Secondary | ICD-10-CM

## 2024-03-14 DIAGNOSIS — B999 Unspecified infectious disease: Secondary | ICD-10-CM

## 2024-03-14 DIAGNOSIS — J3089 Other allergic rhinitis: Secondary | ICD-10-CM

## 2024-03-14 DIAGNOSIS — J4551 Severe persistent asthma with (acute) exacerbation: Secondary | ICD-10-CM | POA: Diagnosis not present

## 2024-03-14 DIAGNOSIS — R21 Rash and other nonspecific skin eruption: Secondary | ICD-10-CM

## 2024-03-14 MED ORDER — AIRSUPRA 90-80 MCG/ACT IN AERO: 2.0000 | INHALATION_SPRAY | RESPIRATORY_TRACT | 2 refills | Status: AC | PRN

## 2024-03-14 MED ORDER — METHYLPREDNISOLONE 4 MG PO TBPK: ORAL_TABLET | ORAL | 0 refills | Status: AC

## 2024-03-14 MED ORDER — XHANCE 93 MCG/ACT NA EXHU: INHALANT_SUSPENSION | NASAL | 5 refills | Status: AC

## 2024-03-14 MED ORDER — XHANCE 93 MCG/ACT NA EXHU: INHALANT_SUSPENSION | NASAL | Status: AC

## 2024-03-14 MED ORDER — LEVALBUTEROL TARTRATE 45 MCG/ACT IN AERO
4.0000 | INHALATION_SPRAY | Freq: Once | RESPIRATORY_TRACT | Status: AC
  Administered 2024-03-14: 4 via RESPIRATORY_TRACT

## 2024-03-14 NOTE — Progress Notes (Signed)
 New Patient Note  RE: Amber Daniels MRN: [id number] DOB: [birth year] Date of Office Visit: 03/14/2024  Consult requested by: Leilani Able, MD Primary care provider: Leilani Able, MD  Chief Complaint: Nasal Congestion (Had this nasal congestion since oct 2024,she has had prednisone, taking allergy that were given no relieve ,coughing , taking in otc cough medicine )  History of Present Illness: I had the pleasure of seeing Amber Daniels for initial evaluation [facility]  on 03/14/2024. She is a 43 y.o. female, who is self-referred here for the evaluation of nasal congestion.  Patient was followed in our office in the past for allergies and asthma. No visits for at least 5+ years.   Discussed the use of AI scribe software for clinical note transcription with the patient, who gave verbal consent to proceed.    Since October of last year, she has experienced worsening respiratory symptoms, particularly at night, coinciding with weather changes. These symptoms include difficulty breathing, chest tightness, shortness of breath, and coughing to the point of needing to expectorate. She has had multiple emergency room visits and was initially treated with over-the-counter medications like Mucinex and Aleve D, which provided minimal relief. In December, she was prescribed prednisone and antibiotics at [facility], followed by a second round in January. Despite these treatments, her symptoms persisted, prompting a visit to her primary care in March.  She recently started using Symbicort, two puffs twice a day, and has an albuterol nebulizer, which she uses daily. She has not been hospitalized for breathing problems but has had pneumonia in the past. She does not smoke and has not received a flu shot this year.  Her allergy symptoms include a stuffy nose, sneezing, coughing, sore throat, dry mouth, itchy and watery eyes, and anosmia. These symptoms have been the worst this  year compared to previous years. She is allergic to bees and environmental factors, with localized swelling from bee stings. She has not had recent allergy testing or shots.  She has experienced at least two sinus infections in the past twelve months. Despite treatments with prednisone, antibiotics, and Afrin nasal spray, her symptoms persist, particularly at night.  She experiences heartburn, managed with Tums or Prilosec as needed, and has a history of hives, eczema, and dermatitis. She takes Lasix for leg swelling and a potassium supplement. She also takes a bariatric vitamin due to malabsorption issues.      She reports symptoms of nasal congestion, wheezing, coughing, sore throat, dry mouth, rhinorrhea, itchy nose, slight itchy/watery eyes. Symptoms have been going on for many years but worsening. The symptoms are present all year around. Other triggers include exposure to unknown. Anosmia: yes. Headache: yes. She has used mucinex, aleve-D, Afrin, singulair, Flonase with minimal improvement in symptoms. Sinus infections: 3-4. Previous work up includes: many year ago and no prior AIT. Previous ENT evaluation: not recently, no prior sinus surgery. Last eye exam: 1-2 months ago. History of reflux: takes tums only as needed.  She reports symptoms of chest tightness, shortness of breath, coughing, wheezing, nocturnal awakenings for many years. Current medications include Symbicort 2 puffs twice a day which help. She reports using aerochamber with inhalers. She tried the following inhalers: albuterol nebulizer.   Main triggers are unknown.  In the last month, frequency of symptoms: daily. Frequency of SABA use: daily. Interference with physical activity: sometimes. Sleep is disturbed. In the last 12 months, emergency room visits/urgent care visits/doctor office visits or hospitalizations due to  respiratory issues: 3. In the last 12 months, oral steroids courses: 2. Lifetime history of  hospitalization for respiratory issues: no. Prior intubations: no. History of pneumonia: yes. She was evaluated by allergist/pulmonologist in the past. Smoking exposure: denies. Up to date with flu vaccine: no. Prior Covid-19 infection: yes.  03/02/2024 pulm visit: "Asthma exacerbation Asthma, previously well-controlled without a daily steroid inhaler, has recently worsened due to seasonal allergies and an upper respiratory infection. She required ER visits for dyspnea and wheezing, treated with two courses of prednisone and antibiotics. Currently, she is not wheezing, so additional prednisone is unnecessary. Initiating a regular inhaler with a steroid and bronchodilator is recommended for long-term management. Symbicort is preferred, with alternatives considered if not covered by insurance.   - Prescribe Symbicort inhaler, 2 puffs in the morning and 2 puffs in the evening. - Instruct to rinse mouth after using the inhaler. - Order lung function test and blood test in 2-3 months to reassess asthma control and allergy status.   Allergic rhinitis Significant congestion and allergy symptoms are exacerbated by the current pollen season. She is on Singulair, Flonase, and Neoral D for allergy management. Continuing these medications during the pollen season is crucial. Environmental control measures to reduce allergen exposure were discussed, including wearing a mask during outdoor activities, washing sheets regularly, leaving shoes outside, and changing clothes after being outdoors. - Continue Singulair, Flonase, and Neoral D for allergy management. - Advise on environmental control measures: wear a mask during outdoor activities, wash sheets regularly, leave shoes outside, and change clothes after being outdoors."  01/12/2024 CXR: "IMPRESSION: No radiographic evidence of acute cardiopulmonary disease."  Assessment and Plan: Annistyn is a 43 y.o. female with: Not well controlled severe persistent asthma  with acute exacerbation Symptoms well controlled until October 2024. Saw pulmonology recently and started on Symbicort 1 week ago. Today's spirometry showed mild obstructive disease with 24% improvement in FEV1 post bronchodilator treatment. Clinically feeling unchanged.  Start medrol pak. Use albuterol nebulizer twice a day before using the Symbicort for the next 1 week.  Daily controller medication(s): continue Symbicort 2 puffs twice a day with spacer and rinse mouth afterwards. Spacer given and demonstrated proper use with inhaler. Patient understood technique and all questions/concerned were addressed.  Continue Singulair (montelukast) 10mg  daily at night. May use Airsupra rescue inhaler 2 puffs every 4 to 6 hours as needed for shortness of breath, chest tightness, coughing, and wheezing. Do not use more than 12 puffs in 24 hours. May use Airsupra rescue inhaler 2 puffs 5 to 15 minutes prior to strenuous physical activities. Rinse mouth after each use.  Coupon given.  Monitor frequency of use - if you need to use it more than twice per week on a consistent basis let us know.  If Paulene Floor is not covered then: May use albuterol rescue inhaler 2 puffs or nebulizer every 4 to 6 hours as needed for shortness of breath, chest tightness, coughing, and wheezing. May use albuterol rescue inhaler 2 puffs 5 to 15 minutes prior to strenuous physical activities. Monitor frequency of use - if you need to use it more than twice per week on a consistent basis let us know.  Get bloodwork to see if you qualify for biologics.  Other allergic rhinitis Other chronic sinusitis 2006 skin testing positive to grass, ragweed, weed, trees, mold, dust mites, cat, dog and cockroach. Noted worsening rhinitis symptoms. Flonase ineffective.  Start Xhance (fluticasone) nasal spray 1-2 sprays per nostril twice a day as  needed for nasal congestion.  Sample given and demonstrated proper use. If this is not covered let  us know.  This will be mailed to you from [facility] pharmacy [phone]) Nasal saline spray (i.e., Simply Saline) or nasal saline lavage (i.e., NeilMed) is recommended as needed and prior to medicated nasal sprays.  Only use afrin when you are really congested for a few days in a row max. Continue Singulair (montelukast) 10mg  daily at night. Use over the counter antihistamines such as Zyrtec (cetirizine), Claritin (loratadine), Allegra (fexofenadine), or Xyzal (levocetirizine) daily as needed. May take twice a day during allergy flares. May switch antihistamines every few months. Get bloodwork If significant positives will recommend allergy injections next. If negative will refer to ENT.   Gastroesophageal reflux disease, unspecified whether esophagitis present Continue lifestyle and dietary modifications.  Recurrent infections Keep track of infections and antibiotics use. If persistent will get bloodwork next to look at immune system.   Rash  Keep track of rashes and take pictures. Write down what you had done/eaten during flares.  See below for proper skin care. Use fragrance free and dye free products. No dryer sheets or fabric softener.    Return in about 4 weeks (around 04/11/2024).  Meds ordered this encounter  Medications   Albuterol-Budesonide (AIRSUPRA) 90-80 MCG/ACT AERO    Sig: Inhale 2 puffs into the lungs every 4 (four) hours as needed (coughing, wheezing, chest tightness). Do not exceed 12 puffs in 24 hours.    Dispense:  10.7 g    Refill:  2    BIN: [id number], PCN: [id number], GRP: [id number], ID [id number]   methylPREDNISolone (MEDROL DOSEPAK) 4 MG TBPK tablet    Sig: Take 6 tablets on day 1, 5 tablets on day 2, 4 tabs on day 3, 3 tabs on day 4, 2 tabs on day 5, 1 tab on day 6.    Dispense:  21 tablet    Refill:  0   Fluticasone Propionate (XHANCE) 93 MCG/ACT EXHU    Sig: 1-2 sprays per nostril twice a day.    Dispense:  16 mL    Refill:  5   Fluticasone Propionate (XHANCE)  93 MCG/ACT EXHU    Sig: 1-2 sprays each nostril twice a day   levalbuterol (XOPENEX HFA) inhaler 4 puff   Lab Orders         Allergens w/Total IgE Area 2         CBC with Differential/Platelet      Other allergy screening: Food allergy: no Medication allergy: no Hymenoptera allergy:  Localized swelling. Urticaria: yes Eczema: yes History of recurrent infections suggestive of immunodeficency: no  Diagnostics: Spirometry:  Tracings reviewed. Her effort: It was hard to get consistent efforts and there is a question as to whether this reflects a maximal maneuver. FVC: 2.51L FEV1: 1.62L, 66% predicted FEV1/FVC ratio: 65% Interpretation: Spirometry consistent with mild obstructive disease with 24% improvement in FEV1 post bronchodilator treatment. Clinically feeling unchanged.   Please see scanned spirometry results for details.  Results discussed with patient/family.   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   Obesity hypoventilation syndrome (HCC) 12/18/2014   Snoring 12/18/2014   Asthma with acute exacerbation 12/18/2014   Morbid obesity (HCC) 11/17/2014   HTN (hypertension) 11/17/2014   Esophageal reflux 11/17/2014   Bilateral edema of lower extremity 11/17/2014   Past Medical History:  Diagnosis Date   Allergy    Anemia    during pregnancy    Arthritis    Asthma    Depression    Edema    lower extremities    GERD (gastroesophageal reflux disease)     Headache    occasional migraine    Heart murmur    Hypertension    Morbid obesity (HCC)    Osteoarthritis    PONV (postoperative nausea and vomiting)    Postpartum care following cesarean delivery (12/2) 11/01/2013   Prediabetes    Shortness of breath dyspnea    only asthma related    Sleep apnea    not on CPAP, dx 01/2013 with Outpatietn overnight study at home.    Swelling    Past Surgical History: Past Surgical History:  Procedure Laterality Date   BREATH TEK H PYLORI N/A 02/22/2015   Procedure: BREATH TEK H PYLORI;  Surgeon: Luretha Murphy, MD;  Location: [facility];  Service: General;  Laterality: N/A;   CESAREAN SECTION     CESAREAN SECTION N/A 11/01/2013   Procedure: REPEAT CESAREAN SECTION;  Surgeon: Serita Kyle, MD;  Location: [facility];  Service: Obstetrics;  Laterality: N/A;  EDD: 11/04/13   CHOLECYSTECTOMY     LAPAROSCOPIC GASTRIC SLEEVE RESECTION N/A 06/25/2015   Procedure: LAPAROSCOPIC GASTRIC SLEEVE RESECTION;  Surgeon: Luretha Murphy, MD;  Location: [facility];  Service: General;  Laterality: N/A;   PANNICULECTOMY  11/20/2023   stab phlebcetomy  Left 01/31/2021   stab 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 by Fabienne Bruns MD    WISDOM TOOTH EXTRACTION     Medication List:  Current Outpatient Medications  Medication Sig Dispense Refill   acetaminophen (TYLENOL) 500 MG tablet Take 1,500 mg by mouth every 6 (six) hours as needed. For pain.     Albuterol-Budesonide (AIRSUPRA) 90-80 MCG/ACT AERO Inhale 2 puffs into the lungs every 4 (four) hours as needed (coughing, wheezing, chest tightness). Do not exceed 12 puffs in 24 hours. 10.7 g 2   budesonide-formoterol (SYMBICORT) 160-4.5 MCG/ACT inhaler Inhale 2 puffs into the lungs 2 times daily at 12 noon and 4 pm. 10.2 g 11   cetirizine (ZYRTEC) 10 MG tablet Take 10 mg by mouth daily.     fexofenadine (ALLEGRA) 180 MG tablet Take 180 mg by mouth daily.     fluconazole (DIFLUCAN) 150 MG tablet Take 1 tablet (150 mg  total) by mouth daily as needed (take at least 3 days between doses as needed for vaginal irritaiton). 10 tablet 0   Fluocinolone Acetonide Scalp 0.01 % OIL Apply topically to affected areas 120 mL 1   Fluticasone Propionate (XHANCE) 93 MCG/ACT EXHU 1-2 sprays per nostril twice a day. 16 mL 5   Fluticasone Propionate (XHANCE) 93 MCG/ACT EXHU 1-2 sprays each nostril twice a day     furosemide (LASIX) 20 MG tablet Take 1 tablet (20 mg total) by mouth daily as needed. 30 tablet 0   HYDROcodone-acetaminophen (HYCET) 7.5-325 mg/15 ml solution      ketoconazole (NIZORAL) 2 % shampoo Apply 1 application topically 2 (two) times a week. 120 mL 1  LORazepam (ATIVAN) 1 MG tablet Take 1 tablet 30 minutes prior to leaving house on day of office surgery.  Bring second tablet with you to the office. 2 tablet 0   methylPREDNISolone (MEDROL DOSEPAK) 4 MG TBPK tablet Take 6 tablets on day 1, 5 tablets on day 2, 4 tabs on day 3, 3 tabs on day 4, 2 tabs on day 5, 1 tab on day 6. 21 tablet 0   metroNIDAZOLE (METROGEL) 0.75 % gel Apply 1 application  topically 2 (two) times daily.     montelukast (SINGULAIR) 10 MG tablet Take 1 tablet (10 mg total) by mouth at bedtime. 90 tablet 1   ondansetron (ZOFRAN-ODT) 4 MG disintegrating tablet DISSOLVE 1 TABLET ON THE TONGUE EVERY 8 (EIGHT) HOURS AS NEEDED FOR NAUSEA FOR UP TO 7 DAYS.     ondansetron (ZOFRAN-ODT) 4 MG disintegrating tablet Take 1 tablet (4 mg total) by mouth every 8 (eight) hours as needed for nausea or vomiting. 20 tablet 0   oxymetazoline (AFRIN NASAL SPRAY) 0.05 % nasal spray Place 1 spray into both nostrils 2 (two) times daily for 3 days. 30 mL 0   Pancrelipase, Lip-Prot-Amyl, (CREON PO) Take by mouth daily.     Vitamin D, Ergocalciferol, (DRISDOL) 1.25 MG (50000 UNIT) CAPS capsule TAKE 1 CAPSULE (50,000 UNITS TOTAL) BY MOUTH EVERY 7 (SEVEN) DAYS. 12 capsule 1   B Complex Vitamins (VITAMIN B-COMPLEX) TABS Take by mouth.     levocetirizine (XYZAL) 5 MG tablet  Take 5 mg by mouth every evening.     Multiple Vitamins-Minerals (BARIATRIC MULTIVITAMINS PO) Take 1 capsule by mouth daily.     pantoprazole (PROTONIX) 20 MG tablet Take 1 tablet (20 mg total) by mouth daily. 14 tablet 0   sucralfate (CARAFATE) 1 g tablet Take 1 tablet (1 g total) by mouth daily as needed. 240 tablet 0   sucralfate (CARAFATE) 1 g tablet Take 1 tablet (1 g total) by mouth with breakfast, with lunch, and with evening meal for 14 days. 42 tablet 0   No current facility-administered medications for this visit.   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  Other Topics Concern   Not on file  Social History Narrative   Caffeine none.  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     Social Drivers of Corporate investment banker Strain: Not on file  Food Insecurity: No Food Insecurity (11/26/2023)   [facility]   Hunger Vital Sign    Worried About Running Out of Food in the Last Year: Never true    Ran Out of Food in the Last Year: Never true  Transportation  Needs: No Transportation Needs (11/26/2023)   Received from [facility] - Transportation    In the past 12 months, has lack of transportation kept you from medical appointments or from  getting medications?: No    Lack of Transportation (Non-Medical): No  Physical Activity: Not on file  Stress: Not on file  Social Connections: Not on file   Lives in a house. Smoking: denies Occupation: Adult nurse HistorySurveyor, minerals in the house: no Engineer, civil (consulting) in the family room: yes Carpet in the bedroom: yes Heating: electric Cooling: central Pet: no  Family History: Family History  Problem Relation Age of Onset   Diabetes Mother    Hypertension Mother    Depression Mother    Anxiety disorder Mother    Sleep apnea Mother    Obesity Mother    Diabetes Father    Hypertension Father    Hyperlipidemia Father    Heart disease Father        cardiomegaly, CHF   Anxiety disorder Father    Obesity Father    Cancer Father 85       lung cancer   Mental illness Father    Hypertension Brother    Diabetes Brother    Hypertension Maternal Grandmother    Hypertension Maternal Grandfather    Hypertension Sister    Problem                               Relation Asthma                                   Daughter  Eczema                                daughter Food allergy                          Daughter  Allergic rhino conjunctivitis     daughter   Review of Systems  Constitutional:  Negative for appetite change, chills, fever and unexpected weight change.  HENT:  Positive for congestion and rhinorrhea.   Eyes:  Positive for itching.  Respiratory:  Positive for cough, chest tightness, shortness of breath and wheezing.   Cardiovascular:  Negative for chest pain.  Gastrointestinal:  Negative for abdominal pain.  Genitourinary:  Negative for difficulty urinating.  Skin:  Negative for rash.  Neurological:  Positive for headaches.    Objective: BP 128/82   Pulse 77   Temp 98.3 F (36.8 C) (Temporal)   Ht 5\' 3"  (1.6 m)   Wt 186 lb (84.4 kg)   SpO2 95%   BMI 32.95 kg/m  Body mass index is 32.95 kg/m. Physical Exam Vitals and nursing note reviewed.   Constitutional:      Appearance: Normal appearance. She is well-developed.  HENT:     Head: Normocephalic and atraumatic.     Right Ear: Tympanic membrane and external ear normal.     Left Ear: Tympanic membrane and external ear normal.     Nose: Nose normal.     Mouth/Throat:     Mouth: Mucous membranes are moist.     Pharynx: Oropharynx is clear.  Eyes:     Conjunctiva/sclera: Conjunctivae normal.  Cardiovascular:     Rate  and Rhythm: Normal rate and regular rhythm.     Heart sounds: Normal heart sounds. No murmur heard.    No friction rub. No gallop.  Pulmonary:     Effort: Pulmonary effort is normal.     Breath sounds: Normal breath sounds. No wheezing, rhonchi or rales.  Musculoskeletal:     Cervical back: Neck supple.  Skin:    General: Skin is warm.     Findings: No rash.  Neurological:     Mental Status: She is alert and oriented to person, place, and time.  Psychiatric:        Behavior: Behavior normal.    The plan was reviewed with the patient/family, and all questions/concerned were addressed.  It was my pleasure to see Loleta today and participate in her care. Please feel free to contact me with any questions or concerns.  Sincerely,  Eudelia Hero, DO Allergy & Immunology  [facility]   [facility] office: [phone] [facility] office: [phone]

## 2024-03-14 NOTE — Patient Instructions (Addendum)
 Breathing Start medrol pak. Use albuterol nebulizer twice a day before using the Symbicort for the next 1 week.  Daily controller medication(s): continue Symbicort 2 puffs twice a day with spacer and rinse mouth afterwards. Spacer given and demonstrated proper use with inhaler. Patient understood technique and all questions/concerned were addressed.   Continue Singulair (montelukast) 10mg  daily at night. May use Airsupra rescue inhaler 2 puffs every 4 to 6 hours as needed for shortness of breath, chest tightness, coughing, and wheezing. Do not use more than 12 puffs in 24 hours. May use Airsupra rescue inhaler 2 puffs 5 to 15 minutes prior to strenuous physical activities. Rinse mouth after each use.  Coupon given.  Monitor frequency of use - if you need to use it more than twice per week on a consistent basis let us know.  If Paulene Floor is not covered then: May use albuterol rescue inhaler 2 puffs or nebulizer every 4 to 6 hours as needed for shortness of breath, chest tightness, coughing, and wheezing. May use albuterol rescue inhaler 2 puffs 5 to 15 minutes prior to strenuous physical activities. Monitor frequency of use - if you need to use it more than twice per week on a consistent basis let us know.  Breathing control goals:  Full participation in all desired activities (may need albuterol before activity) Albuterol use two times or less a week on average (not counting use with activity) Cough interfering with sleep two times or less a month Oral steroids no more than once a year No hospitalizations  Get bloodwork to see if you qualify for biologics (an injection for asthma control).   Environmental allergies Start Xhance (fluticasone) nasal spray 1-2 sprays per nostril twice a day as needed for nasal congestion.  Sample given and demonstrated proper use. If this is not covered let us know.  This will be mailed to you from [facility] pharmacy [phone]) Nasal saline spray (i.e.,  Simply Saline) or nasal saline lavage (i.e., NeilMed) is recommended as needed and prior to medicated nasal sprays.  Only use afrin when you are really congested for a few days in a row max. Continue Singulair (montelukast) 10mg  daily at night. Use over the counter antihistamines such as Zyrtec (cetirizine), Claritin (loratadine), Allegra (fexofenadine), or Xyzal (levocetirizine) daily as needed. May take twice a day during allergy flares. May switch antihistamines every few months. Get bloodwork If significant positives will recommend allergy injections next. If negative will refer to ENT.  We are ordering labs, so please allow 1-2 weeks for the results to come back. With the newly implemented Cures Act, the labs might be visible to you at the same time that they become visible to me. However, I will not address the results until all of the results are back, so please be patient.  In the meantime, continue recommendations in your patient instructions, including avoidance measures (if applicable), until you hear from me.  Skin  Keep track of rashes and take pictures. Write down what you had done/eaten during flares.  See below for proper skin care. Use fragrance free and dye free products. No dryer sheets or fabric softener.    Reflux Continue lifestyle and dietary modifications.  Infections Keep track of infections and antibiotics use. If persistent will get bloodwork next to look at immune system.   Follow up in 4 weeks or sooner if needed.  Skin care recommendations  Bath time: Always use lukewarm water. AVOID very hot or cold water. Keep bathing time to 5-10  minutes. Do NOT use bubble bath. Use a mild soap and use just enough to wash the dirty areas. Do NOT scrub skin vigorously.  After bathing, pat dry your skin with a towel. Do NOT rub or scrub the skin.  Moisturizers and prescriptions:  ALWAYS apply moisturizers immediately after bathing (within 3 minutes). This helps to  lock-in moisture. Use the moisturizer several times a day over the whole body. Good summer moisturizers include: Aveeno, CeraVe, Cetaphil. Good winter moisturizers include: Aquaphor, Vaseline, Cerave, Cetaphil, Eucerin, Vanicream. When using moisturizers along with medications, the moisturizer should be applied about one hour after applying the medication to prevent diluting effect of the medication or moisturize around where you applied the medications. When not using medications, the moisturizer can be continued twice daily as maintenance.  Laundry and clothing: Avoid laundry products with added color or perfumes. Use unscented hypo-allergenic laundry products such as Tide free, Cheer free & gentle, and All free and clear.  If the skin still seems dry or sensitive, you can try double-rinsing the clothes. Avoid tight or scratchy clothing such as wool. Do not use fabric softeners or dyer sheets.   Buffered Isotonic Saline Irrigations:  Goal: When you irrigate with the isotonic saline (salt water) it washes mucous and other debris from your nose that could be contributing to your nasal symptoms.   Recipe: Obtain 1 quart jar that is clean Fill with clean (bottled, boiled or distilled) water Add 1-2 heaping teaspoons of salt without iodine If the solution with 2 teaspoons of salt is too strong, adjust the amount down until better tolerated Add 1 teaspoon of [employer] baking soda (pure bicarbonate) Mix ingredients together and store at room temperature and discard after 1 week * Alternatively you can buy pre made salt packets for the NeilMed bottle or there          are other over the counter brands available  Instructions: Warm  cup of the solution in the microwave if desired but be careful not to overheat as this will burn the inside of your nose Stand over a sink (or do it while you shower) and squirt the solution into one side of your nose aiming towards the back of your  head Sometimes saying "coca cola" while irrigating can be helpful to prevent fluid from going down your throat  The solution will travel to the back of your nose and then come out the other side Perform this again on the other side Try to do this twice a day If you are using a nasal spray in addition to the irrigation, irrigate first and then use the topical nasal spray otherwise you will wash the nasal spray out of your nose

## 2024-03-14 NOTE — Progress Notes (Signed)
 Medication Samples have been provided to the patient.  Drug name: Paddy Boas       Strength: 93mcg        Qty: 1  LOT: [id number]  Exp.Date: 02/28/2025  Dosing instructions: 1-2 sprays each nostril twice a day.  The patient has been instructed regarding the correct time, dose, and frequency of taking this medication, including desired effects and most common side effects.   Merwyn Achilles 12:34 PM 03/14/2024

## 2024-03-21 ENCOUNTER — Encounter: Payer: Self-pay | Admitting: Allergy

## 2024-03-21 ENCOUNTER — Telehealth: Payer: Self-pay | Admitting: Allergy

## 2024-03-21 LAB — CBC WITH DIFFERENTIAL/PLATELET
Basophils Absolute: 0 10*3/uL (ref 0.0–0.2)
Basos: 1 %
EOS (ABSOLUTE): 0.4 10*3/uL (ref 0.0–0.4)
Eos: 8 %
Hematocrit: 41.6 % (ref 34.0–46.6)
Hemoglobin: 13.7 g/dL (ref 11.1–15.9)
Immature Grans (Abs): 0 10*3/uL (ref 0.0–0.1)
Immature Granulocytes: 0 %
Lymphocytes Absolute: 1.2 10*3/uL (ref 0.7–3.1)
Lymphs: 22 %
MCH: 31.2 pg (ref 26.6–33.0)
MCHC: 32.9 g/dL (ref 31.5–35.7)
MCV: 95 fL (ref 79–97)
Monocytes Absolute: 0.5 10*3/uL (ref 0.1–0.9)
Monocytes: 9 %
Neutrophils Absolute: 3.3 10*3/uL (ref 1.4–7.0)
Neutrophils: 60 %
Platelets: 253 10*3/uL (ref 150–450)
RBC: 4.39 x10E6/uL (ref 3.77–5.28)
RDW: 12.4 % (ref 11.7–15.4)
WBC: 5.5 10*3/uL (ref 3.4–10.8)

## 2024-03-21 LAB — ALLERGENS W/TOTAL IGE AREA 2
Alternaria Alternata IgE: <0.1 kU/L
Aspergillus Fumigatus IgE: <0.1 kU/L
Bermuda Grass IgE: 0.68 kU/L — AB
Cat Dander IgE: <0.1 kU/L
Cedar, Mountain IgE: <0.1 kU/L
Cladosporium Herbarum IgE: <0.1 kU/L
Cockroach, German IgE: 0.13 kU/L — AB
Common Silver Birch IgE: 0.21 kU/L — AB
Cottonwood IgE: 0.1 kU/L — AB
D Farinae IgE: <0.1 kU/L
D Pteronyssinus IgE: 0.18 kU/L — AB
Dog Dander IgE: 0.64 kU/L — AB
Elm, American IgE: <0.1 kU/L
IgE (Immunoglobulin E), Serum: 356 [IU]/mL (ref 6–495)
Johnson Grass IgE: 0.71 kU/L — AB
Maple/Box Elder IgE: <0.1 kU/L
Mouse Urine IgE: <0.1 kU/L
Oak, White IgE: <0.1 kU/L
Pecan, Hickory IgE: <0.1 kU/L
Penicillium Chrysogen IgE: <0.1 kU/L
Pigweed, Rough IgE: <0.1 kU/L
Ragweed, Short IgE: <0.1 kU/L
Sheep Sorrel IgE Qn: <0.1 kU/L
Timothy Grass IgE: 1.13 kU/L — AB
White Mulberry IgE: <0.1 kU/L

## 2024-03-21 NOTE — Telephone Encounter (Signed)
 Please place referral to ENT for chronic sinusitis.  Environmental panel was positive to grass pollen, dog dander. Borderline to trees pollen and dust mites

## 2024-03-25 NOTE — Telephone Encounter (Signed)
 Krysta was scheduled with [facility] on 7/21 at 1:00 pm with Dr. Darlin Ehrlich

## 2024-04-17 NOTE — Progress Notes (Deleted)
 Follow Up Note  RE: Amber Daniels MRN: [id number] DOB: [birth year] Date of Office Visit: 04/18/2024  Referring provider: Danella Dunn, MD Primary care provider: Danella Dunn, MD  Chief Complaint: No chief complaint on file.  History of Present Illness: I had the pleasure of seeing Amber Daniels for a follow up visit at the [facility] on 04/17/2024. She is a 43 y.o. female, who is being followed for asthma, allergic rhinitis, chronic sinusitis, GERD, recurrent infections and rash. Her previous allergy office visit was on 03/14/2024 with Dr. Burdette Carolin. Today is a regular follow up visit.  Discussed the use of AI scribe software for clinical note transcription with the patient, who gave verbal consent to proceed.  History of Present Illness            2025 labs: "Your bloodwork was positive to grass pollen, dog dander. Borderline to trees pollen and dust mites. See below for environmental control measures. Your tests results didn't show as much allergens as I would have expected. I recommend that you see ENT as well - I'll place a referral. If unremarkable then recommend allergy shots but your asthma has to be in better control before starting.   Your blood count eosinophils were 400 so you would qualify for certain injections (biologics) to better control your asthma. Please keep appointment in May and meanwhile continue all your medications as discussed at the last visit."  Assessment and Plan: Amber Daniels is a 43 y.o. female with: Not well controlled severe persistent asthma with acute exacerbation Symptoms well controlled until October 2024. Saw pulmonology recently and started on Symbicort  1 week ago. Today's spirometry showed mild obstructive disease with 24% improvement in FEV1 post bronchodilator treatment. Clinically feeling unchanged.  Start medrol  pak. Use albuterol  nebulizer twice a day before using the Symbicort  for the next 1 week.  Daily controller  medication(s): continue Symbicort  160mcg 2 puffs twice a day with spacer and rinse mouth afterwards. Spacer given and demonstrated proper use with inhaler. Patient understood technique and all questions/concerned were addressed.  Continue Singulair  (montelukast ) 10mg  daily at night. May use Airsupra  rescue inhaler 2 puffs every 4 to 6 hours as needed for shortness of breath, chest tightness, coughing, and wheezing. Do not use more than 12 puffs in 24 hours. May use Airsupra  rescue inhaler 2 puffs 5 to 15 minutes prior to strenuous physical activities. Rinse mouth after each use.  Coupon given.  Monitor frequency of use - if you need to use it more than twice per week on a consistent basis let us  know.  If airsupra  is not covered then: May use albuterol  rescue inhaler 2 puffs or nebulizer every 4 to 6 hours as needed for shortness of breath, chest tightness, coughing, and wheezing. May use albuterol  rescue inhaler 2 puffs 5 to 15 minutes prior to strenuous physical activities. Monitor frequency of use - if you need to use it more than twice per week on a consistent basis let us  know.  Get bloodwork to see if you qualify for biologics.   Other allergic rhinitis Other chronic sinusitis 2006 skin testing positive to grass, ragweed, weed, trees, mold, dust mites, cat, dog and cockroach. Noted worsening rhinitis symptoms. Flonase  ineffective.  Start Xhance  (fluticasone ) nasal spray 1-2 sprays per nostril twice a day as needed for nasal congestion.  Sample given and demonstrated proper use. If this is not covered let us  know.  This will be mailed to you from [facility] pharmacy [phone]) Nasal saline spray (  i.e., Simply Saline) or nasal saline lavage (i.e., NeilMed) is recommended as needed and prior to medicated nasal sprays.  Only use afrin when you are really congested for a few days in a row max. Continue Singulair  (montelukast ) 10mg  daily at night. Use over the counter antihistamines such as Zyrtec   (cetirizine ), Claritin (loratadine), Allegra (fexofenadine), or Xyzal (levocetirizine) daily as needed. May take twice a day during allergy flares. May switch antihistamines every few months. Get bloodwork If significant positives will recommend allergy injections next. If negative will refer to ENT.    Gastroesophageal reflux disease, unspecified whether esophagitis present Continue lifestyle and dietary modifications.   Recurrent infections Keep track of infections and antibiotics use. If persistent will get bloodwork next to look at immune system.    Rash  Keep track of rashes and take pictures. Write down what you had done/eaten during flares.  See below for proper skin care. Use fragrance free and dye free products. No dryer sheets or fabric softener.   Assessment and Plan              No follow-ups on file.  No orders of the defined types were placed in this encounter.  Lab Orders  No laboratory test(s) ordered today    Diagnostics: Spirometry:  Tracings reviewed. Her effort: {Blank single:19197::"Good reproducible efforts.","It was hard to get consistent efforts and there is a question as to whether this reflects a maximal maneuver.","Poor effort, data can not be interpreted."} FVC: ***L FEV1: ***L, ***% predicted FEV1/FVC ratio: ***% Interpretation: {Blank single:19197::"Spirometry consistent with mild obstructive disease","Spirometry consistent with moderate obstructive disease","Spirometry consistent with severe obstructive disease","Spirometry consistent with possible restrictive disease","Spirometry consistent with mixed obstructive and restrictive disease","Spirometry uninterpretable due to technique","Spirometry consistent with normal pattern","No overt abnormalities noted given today's efforts"}.  Please see scanned spirometry results for details.  Skin Testing: {Blank single:19197::"Select foods","Environmental allergy panel","Environmental allergy panel and  select foods","Food allergy panel","None","Deferred due to recent antihistamines use"}. *** Results discussed with patient/family.   Medication List:  Current Outpatient Medications  Medication Sig Dispense Refill   acetaminophen  (TYLENOL ) 500 MG tablet Take 1,500 mg by mouth every 6 (six) hours as needed. For pain.     Albuterol -Budesonide  (AIRSUPRA ) 90-80 MCG/ACT AERO Inhale 2 puffs into the lungs every 4 (four) hours as needed (coughing, wheezing, chest tightness). Do not exceed 12 puffs in 24 hours. 10.7 g 2   B Complex Vitamins (VITAMIN B-COMPLEX) TABS Take by mouth.     budesonide -formoterol  (SYMBICORT ) 160-4.5 MCG/ACT inhaler Inhale 2 puffs into the lungs 2 times daily at 12 noon and 4 pm. 10.2 g 11   cetirizine  (ZYRTEC ) 10 MG tablet Take 10 mg by mouth daily.     fexofenadine (ALLEGRA) 180 MG tablet Take 180 mg by mouth daily.     fluconazole  (DIFLUCAN ) 150 MG tablet Take 1 tablet (150 mg total) by mouth daily as needed (take at least 3 days between doses as needed for vaginal irritaiton). 10 tablet 0   Fluocinolone  Acetonide Scalp 0.01 % OIL Apply topically to affected areas 120 mL 1   Fluticasone  Propionate (XHANCE ) 93 MCG/ACT EXHU 1-2 sprays per nostril twice a day. 16 mL 5   Fluticasone  Propionate (XHANCE ) 93 MCG/ACT EXHU 1-2 sprays each nostril twice a day     furosemide  (LASIX ) 20 MG tablet Take 1 tablet (20 mg total) by mouth daily as needed. 30 tablet 0   HYDROcodone -acetaminophen  (HYCET) 7.5-325 mg/15 ml solution      ketoconazole  (NIZORAL ) 2 % shampoo Apply  1 application topically 2 (two) times a week. 120 mL 1   levocetirizine (XYZAL) 5 MG tablet Take 5 mg by mouth every evening.     LORazepam  (ATIVAN ) 1 MG tablet Take 1 tablet 30 minutes prior to leaving house on day of office surgery.  Bring second tablet with you to the office. 2 tablet 0   methylPREDNISolone  (MEDROL  DOSEPAK) 4 MG TBPK tablet Take 6 tablets on day 1, 5 tablets on day 2, 4 tabs on day 3, 3 tabs on day 4, 2  tabs on day 5, 1 tab on day 6. 21 tablet 0   metroNIDAZOLE  (METROGEL ) 0.75 % gel Apply 1 application  topically 2 (two) times daily.     montelukast  (SINGULAIR ) 10 MG tablet Take 1 tablet (10 mg total) by mouth at bedtime. 90 tablet 1   Multiple Vitamins-Minerals (BARIATRIC MULTIVITAMINS PO) Take 1 capsule by mouth daily.     ondansetron  (ZOFRAN -ODT) 4 MG disintegrating tablet DISSOLVE 1 TABLET ON THE TONGUE EVERY 8 (EIGHT) HOURS AS NEEDED FOR NAUSEA FOR UP TO 7 DAYS.     ondansetron  (ZOFRAN -ODT) 4 MG disintegrating tablet Take 1 tablet (4 mg total) by mouth every 8 (eight) hours as needed for nausea or vomiting. 20 tablet 0   oxymetazoline  (AFRIN NASAL SPRAY) 0.05 % nasal spray Place 1 spray into both nostrils 2 (two) times daily for 3 days. 30 mL 0   Pancrelipase, Lip-Prot-Amyl, (CREON PO) Take by mouth daily.     pantoprazole  (PROTONIX ) 20 MG tablet Take 1 tablet (20 mg total) by mouth daily. 14 tablet 0   sucralfate  (CARAFATE ) 1 g tablet Take 1 tablet (1 g total) by mouth daily as needed. 240 tablet 0   sucralfate  (CARAFATE ) 1 g tablet Take 1 tablet (1 g total) by mouth with breakfast, with lunch, and with evening meal for 14 days. 42 tablet 0   Vitamin D , Ergocalciferol , (DRISDOL ) 1.25 MG (50000 UNIT) CAPS capsule TAKE 1 CAPSULE (50,000 UNITS TOTAL) BY MOUTH EVERY 7 (SEVEN) DAYS. 12 capsule 1   No current facility-administered medications for this visit.   Allergies: Allergies  Allergen Reactions   Bee Venom    Other     Seasonal and enviromental allergies (dust)   I reviewed her past medical history, social history, family history, and environmental history and no significant changes have been reported from her previous visit.  Review of Systems  Constitutional:  Negative for appetite change, chills, fever and unexpected weight change.  HENT:  Positive for congestion and rhinorrhea.   Eyes:  Positive for itching.  Respiratory:  Positive for cough, chest tightness, shortness of  breath and wheezing.   Cardiovascular:  Negative for chest pain.  Gastrointestinal:  Negative for abdominal pain.  Genitourinary:  Negative for difficulty urinating.  Skin:  Negative for rash.  Neurological:  Positive for headaches.    Objective: There were no vitals taken for this visit. There is no height or weight on file to calculate BMI. Physical Exam Vitals and nursing note reviewed.  Constitutional:      Appearance: Normal appearance. She is well-developed.  HENT:     Head: Normocephalic and atraumatic.     Right Ear: Tympanic membrane and external ear normal.     Left Ear: Tympanic membrane and external ear normal.     Nose: Nose normal.     Mouth/Throat:     Mouth: Mucous membranes are moist.     Pharynx: Oropharynx is clear.  Eyes:  Conjunctiva/sclera: Conjunctivae normal.  Cardiovascular:     Rate and Rhythm: Normal rate and regular rhythm.     Heart sounds: Normal heart sounds. No murmur heard.    No friction rub. No gallop.  Pulmonary:     Effort: Pulmonary effort is normal.     Breath sounds: Normal breath sounds. No wheezing, rhonchi or rales.  Musculoskeletal:     Cervical back: Neck supple.  Skin:    General: Skin is warm.     Findings: No rash.  Neurological:     Mental Status: She is alert and oriented to person, place, and time.  Psychiatric:        Behavior: Behavior normal.    Previous notes and tests were reviewed. The plan was reviewed with the patient/family, and all questions/concerned were addressed.  It was my pleasure to see Amber Daniels today and participate in her care. Please feel free to contact me with any questions or concerns.  Sincerely,  Eudelia Hero, DO Allergy & Immunology  [facility]  [place] office: [phone] [facility] office: [phone]

## 2024-04-18 ENCOUNTER — Ambulatory Visit: Admitting: Allergy

## 2024-05-10 NOTE — Progress Notes (Deleted)
 Follow Up Note  RE: Amber Daniels MRN: [id number] DOB: Oct 12, 1981 Date of Office Visit: 05/11/2024  Referring provider: Danella Dunn, MD Primary care provider: Danella Dunn, MD  Chief Complaint: No chief complaint on file.  History of Present Illness: I had the pleasure of seeing Amber Daniels for a follow up visit at the [facility] on 05/10/2024. She is a 43 y.o. female, who is being followed for asthma, allergic rhinitis, chronic sinusitis, GERD, recurrent infections and rash. Her previous allergy office visit was on 03/14/2024 with Dr. Burdette Carolin. Today is a regular follow up visit.  Discussed the use of AI scribe software for clinical note transcription with the patient, who gave verbal consent to proceed.  History of Present Illness            2025 labs: "Your bloodwork was positive to grass pollen, dog dander. Borderline to trees pollen and dust mites. See below for environmental control measures. Your tests results didn't show as much allergens as I would have expected. I recommend that you see ENT as well - I'll place a referral. If unremarkable then recommend allergy shots but your asthma has to be in better control before starting.   Your blood count eosinophils were 400 so you would qualify for certain injections (biologics) to better control your asthma. Please keep appointment in May and meanwhile continue all your medications as discussed at the last visit."  Assessment and Plan: Amber Daniels is a 43 y.o. female with: Not well controlled severe persistent asthma with acute exacerbation Symptoms well controlled until October 2024. Saw pulmonology recently and started on Symbicort  1 week ago. Today's spirometry showed mild obstructive disease with 24% improvement in FEV1 post bronchodilator treatment. Clinically feeling unchanged.  Start medrol  pak. Use albuterol  nebulizer twice a day before using the Symbicort  for the next 1 week.  Daily controller  medication(s): continue Symbicort  160mcg 2 puffs twice a day with spacer and rinse mouth afterwards. Spacer given and demonstrated proper use with inhaler. Patient understood technique and all questions/concerned were addressed.  Continue Singulair  (montelukast ) 10mg  daily at night. May use Airsupra  rescue inhaler 2 puffs every 4 to 6 hours as needed for shortness of breath, chest tightness, coughing, and wheezing. Do not use more than 12 puffs in 24 hours. May use Airsupra  rescue inhaler 2 puffs 5 to 15 minutes prior to strenuous physical activities. Rinse mouth after each use.  Coupon given.  Monitor frequency of use - if you need to use it more than twice per week on a consistent basis let us  know.  If airsupra  is not covered then: May use albuterol  rescue inhaler 2 puffs or nebulizer every 4 to 6 hours as needed for shortness of breath, chest tightness, coughing, and wheezing. May use albuterol  rescue inhaler 2 puffs 5 to 15 minutes prior to strenuous physical activities. Monitor frequency of use - if you need to use it more than twice per week on a consistent basis let us  know.  Get bloodwork to see if you qualify for biologics.   Other allergic rhinitis Other chronic sinusitis 2006 skin testing positive to grass, ragweed, weed, trees, mold, dust mites, cat, dog and cockroach. Noted worsening rhinitis symptoms. Flonase  ineffective.  Start Xhance  (fluticasone ) nasal spray 1-2 sprays per nostril twice a day as needed for nasal congestion.  Sample given and demonstrated proper use. If this is not covered let us  know.  This will be mailed to you from [facility] pharmacy [phone]) Nasal saline spray (  i.e., Simply Saline) or nasal saline lavage (i.e., NeilMed) is recommended as needed and prior to medicated nasal sprays.  Only use afrin when you are really congested for a few days in a row max. Continue Singulair  (montelukast ) 10mg  daily at night. Use over the counter antihistamines such as Zyrtec   (cetirizine ), Claritin (loratadine), Allegra (fexofenadine), or Xyzal (levocetirizine) daily as needed. May take twice a day during allergy flares. May switch antihistamines every few months. Get bloodwork If significant positives will recommend allergy injections next. If negative will refer to ENT.    Gastroesophageal reflux disease, unspecified whether esophagitis present Continue lifestyle and dietary modifications.   Recurrent infections Keep track of infections and antibiotics use. If persistent will get bloodwork next to look at immune system.    Rash  Keep track of rashes and take pictures. Write down what you had done/eaten during flares.  See below for proper skin care. Use fragrance free and dye free products. No dryer sheets or fabric softener.   Assessment and Plan              No follow-ups on file.  No orders of the defined types were placed in this encounter.  Lab Orders  No laboratory test(s) ordered today    Diagnostics: Spirometry:  Tracings reviewed. Her effort: {Blank single:19197::"Good reproducible efforts.","It was hard to get consistent efforts and there is a question as to whether this reflects a maximal maneuver.","Poor effort, data can not be interpreted."} FVC: ***L FEV1: ***L, ***% predicted FEV1/FVC ratio: ***% Interpretation: {Blank single:19197::"Spirometry consistent with mild obstructive disease","Spirometry consistent with moderate obstructive disease","Spirometry consistent with severe obstructive disease","Spirometry consistent with possible restrictive disease","Spirometry consistent with mixed obstructive and restrictive disease","Spirometry uninterpretable due to technique","Spirometry consistent with normal pattern","No overt abnormalities noted given today's efforts"}.  Please see scanned spirometry results for details.  Skin Testing: {Blank single:19197::"Select foods","Environmental allergy panel","Environmental allergy panel and  select foods","Food allergy panel","None","Deferred due to recent antihistamines use"}. *** Results discussed with patient/family.   Medication List:  Current Outpatient Medications  Medication Sig Dispense Refill   acetaminophen  (TYLENOL ) 500 MG tablet Take 1,500 mg by mouth every 6 (six) hours as needed. For pain.     Albuterol -Budesonide  (AIRSUPRA ) 90-80 MCG/ACT AERO Inhale 2 puffs into the lungs every 4 (four) hours as needed (coughing, wheezing, chest tightness). Do not exceed 12 puffs in 24 hours. 10.7 g 2   B Complex Vitamins (VITAMIN B-COMPLEX) TABS Take by mouth.     budesonide -formoterol  (SYMBICORT ) 160-4.5 MCG/ACT inhaler Inhale 2 puffs into the lungs 2 times daily at 12 noon and 4 pm. 10.2 g 11   cetirizine  (ZYRTEC ) 10 MG tablet Take 10 mg by mouth daily.     fexofenadine (ALLEGRA) 180 MG tablet Take 180 mg by mouth daily.     fluconazole  (DIFLUCAN ) 150 MG tablet Take 1 tablet (150 mg total) by mouth daily as needed (take at least 3 days between doses as needed for vaginal irritaiton). 10 tablet 0   Fluocinolone  Acetonide Scalp 0.01 % OIL Apply topically to affected areas 120 mL 1   Fluticasone  Propionate (XHANCE ) 93 MCG/ACT EXHU 1-2 sprays per nostril twice a day. 16 mL 5   Fluticasone  Propionate (XHANCE ) 93 MCG/ACT EXHU 1-2 sprays each nostril twice a day     furosemide  (LASIX ) 20 MG tablet Take 1 tablet (20 mg total) by mouth daily as needed. 30 tablet 0   HYDROcodone -acetaminophen  (HYCET) 7.5-325 mg/15 ml solution      ketoconazole  (NIZORAL ) 2 % shampoo Apply  1 application topically 2 (two) times a week. 120 mL 1   levocetirizine (XYZAL) 5 MG tablet Take 5 mg by mouth every evening.     LORazepam  (ATIVAN ) 1 MG tablet Take 1 tablet 30 minutes prior to leaving house on day of office surgery.  Bring second tablet with you to the office. 2 tablet 0   methylPREDNISolone  (MEDROL  DOSEPAK) 4 MG TBPK tablet Take 6 tablets on day 1, 5 tablets on day 2, 4 tabs on day 3, 3 tabs on day 4, 2  tabs on day 5, 1 tab on day 6. 21 tablet 0   metroNIDAZOLE  (METROGEL ) 0.75 % gel Apply 1 application  topically 2 (two) times daily.     montelukast  (SINGULAIR ) 10 MG tablet Take 1 tablet (10 mg total) by mouth at bedtime. 90 tablet 1   Multiple Vitamins-Minerals (BARIATRIC MULTIVITAMINS PO) Take 1 capsule by mouth daily.     ondansetron  (ZOFRAN -ODT) 4 MG disintegrating tablet DISSOLVE 1 TABLET ON THE TONGUE EVERY 8 (EIGHT) HOURS AS NEEDED FOR NAUSEA FOR UP TO 7 DAYS.     ondansetron  (ZOFRAN -ODT) 4 MG disintegrating tablet Take 1 tablet (4 mg total) by mouth every 8 (eight) hours as needed for nausea or vomiting. 20 tablet 0   oxymetazoline  (AFRIN NASAL SPRAY) 0.05 % nasal spray Place 1 spray into both nostrils 2 (two) times daily for 3 days. 30 mL 0   Pancrelipase, Lip-Prot-Amyl, (CREON PO) Take by mouth daily.     pantoprazole  (PROTONIX ) 20 MG tablet Take 1 tablet (20 mg total) by mouth daily. 14 tablet 0   sucralfate  (CARAFATE ) 1 g tablet Take 1 tablet (1 g total) by mouth daily as needed. 240 tablet 0   sucralfate  (CARAFATE ) 1 g tablet Take 1 tablet (1 g total) by mouth with breakfast, with lunch, and with evening meal for 14 days. 42 tablet 0   Vitamin D , Ergocalciferol , (DRISDOL ) 1.25 MG (50000 UNIT) CAPS capsule TAKE 1 CAPSULE (50,000 UNITS TOTAL) BY MOUTH EVERY 7 (SEVEN) DAYS. 12 capsule 1   No current facility-administered medications for this visit.   Allergies: Allergies  Allergen Reactions   Bee Venom    Other     Seasonal and enviromental allergies (dust)   I reviewed her past medical history, social history, family history, and environmental history and no significant changes have been reported from her previous visit.  Review of Systems  Constitutional:  Negative for appetite change, chills, fever and unexpected weight change.  HENT:  Positive for congestion and rhinorrhea.   Eyes:  Positive for itching.  Respiratory:  Positive for cough, chest tightness, shortness of  breath and wheezing.   Cardiovascular:  Negative for chest pain.  Gastrointestinal:  Negative for abdominal pain.  Genitourinary:  Negative for difficulty urinating.  Skin:  Negative for rash.  Neurological:  Positive for headaches.    Objective: There were no vitals taken for this visit. There is no height or weight on file to calculate BMI. Physical Exam Vitals and nursing note reviewed.  Constitutional:      Appearance: Normal appearance. She is well-developed.  HENT:     Head: Normocephalic and atraumatic.     Right Ear: Tympanic membrane and external ear normal.     Left Ear: Tympanic membrane and external ear normal.     Nose: Nose normal.     Mouth/Throat:     Mouth: Mucous membranes are moist.     Pharynx: Oropharynx is clear.  Eyes:  Conjunctiva/sclera: Conjunctivae normal.  Cardiovascular:     Rate and Rhythm: Normal rate and regular rhythm.     Heart sounds: Normal heart sounds. No murmur heard.    No friction rub. No gallop.  Pulmonary:     Effort: Pulmonary effort is normal.     Breath sounds: Normal breath sounds. No wheezing, rhonchi or rales.  Musculoskeletal:     Cervical back: Neck supple.  Skin:    General: Skin is warm.     Findings: No rash.  Neurological:     Mental Status: She is alert and oriented to person, place, and time.  Psychiatric:        Behavior: Behavior normal.    Previous notes and tests were reviewed. The plan was reviewed with the patient/family, and all questions/concerned were addressed.  It was my pleasure to see Amber Daniels today and participate in her care. Please feel free to contact me with any questions or concerns.  Sincerely,  Eudelia Hero, DO Allergy & Immunology  [facility]  [place] office: [phone] [facility] office: [phone]

## 2024-05-11 ENCOUNTER — Ambulatory Visit: Admitting: Allergy

## 2024-05-11 DIAGNOSIS — J309 Allergic rhinitis, unspecified: Secondary | ICD-10-CM

## 2024-05-23 ENCOUNTER — Encounter: Payer: Self-pay | Admitting: Pulmonary Disease

## 2024-06-01 ENCOUNTER — Encounter: Payer: Self-pay | Admitting: Pulmonary Disease

## 2024-06-02 NOTE — Telephone Encounter (Signed)
**Note De-identified  [place] Obfuscation** Please advise 

## 2024-06-20 ENCOUNTER — Encounter: Payer: Self-pay | Admitting: Allergy

## 2024-06-20 ENCOUNTER — Institutional Professional Consult (permissible substitution) (INDEPENDENT_AMBULATORY_CARE_PROVIDER_SITE_OTHER): Admitting: Otolaryngology

## 2024-06-21 NOTE — Telephone Encounter (Signed)
 FYI

## 2024-06-27 ENCOUNTER — Encounter: Payer: Self-pay | Admitting: Pulmonary Disease

## 2024-06-27 ENCOUNTER — Ambulatory Visit (INDEPENDENT_AMBULATORY_CARE_PROVIDER_SITE_OTHER): Admitting: Pulmonary Disease

## 2024-06-27 ENCOUNTER — Ambulatory Visit: Admitting: Pulmonary Disease

## 2024-06-27 VITALS — BP 100/80 | HR 78 | Ht 63.0 in | Wt 188.0 lb

## 2024-06-27 DIAGNOSIS — J4541 Moderate persistent asthma with (acute) exacerbation: Secondary | ICD-10-CM

## 2024-06-27 DIAGNOSIS — J4551 Severe persistent asthma with (acute) exacerbation: Secondary | ICD-10-CM | POA: Diagnosis not present

## 2024-06-27 LAB — PULMONARY FUNCTION TEST
DL/VA % pred: 129 %
DL/VA: 5.73 ml/min/mmHg/L
DLCO unc % pred: 124 %
DLCO unc: 25.97 ml/min/mmHg
FEF 25-75 Post: 2.52 L/s
FEF 25-75 Pre: 1.58 L/s
FEF2575-%Change-Post: 59 %
FEF2575-%Pred-Post: 84 %
FEF2575-%Pred-Pre: 53 %
FEV1-%Change-Post: 12 %
FEV1-%Pred-Post: 91 %
FEV1-%Pred-Pre: 81 %
FEV1-Post: 2.63 L
FEV1-Pre: 2.33 L
FEV1FVC-%Change-Post: 11 %
FEV1FVC-%Pred-Pre: 88 %
FEV6-%Change-Post: 1 %
FEV6-%Pred-Post: 93 %
FEV6-%Pred-Pre: 92 %
FEV6-Post: 3.26 L
FEV6-Pre: 3.22 L
FEV6FVC-%Change-Post: 0 %
FEV6FVC-%Pred-Post: 102 %
FEV6FVC-%Pred-Pre: 101 %
FVC-%Change-Post: 0 %
FVC-%Pred-Post: 92 %
FVC-%Pred-Pre: 91 %
FVC-Post: 3.26 L
FVC-Pre: 3.23 L
Post FEV1/FVC ratio: 81 %
Post FEV6/FVC ratio: 100 %
Pre FEV1/FVC ratio: 72 %
Pre FEV6/FVC Ratio: 100 %
RV % pred: 147 %
RV: 2.36 L
TLC % pred: 113 %
TLC: 5.58 L

## 2024-06-27 NOTE — Patient Instructions (Signed)
 Full pft performed today.

## 2024-06-27 NOTE — Progress Notes (Signed)
 Full pft performed today.

## 2024-06-27 NOTE — Patient Instructions (Signed)
 VISIT SUMMARY:  Today, we discussed your ongoing asthma symptoms and nasal congestion. You have been experiencing difficulty breathing, especially after physical activity, and have been using your rescue inhaler more frequently. We reviewed your current medications and made some adjustments to better manage your symptoms.  YOUR PLAN:  -ASTHMA: Asthma is a condition where your airways become inflamed and narrow, making it hard to breathe. You have had frequent asthma attacks requiring prednisone . Continue taking Symbicort  as prescribed (two puffs in the morning and two puffs in the evening) and Singulair . We will start the process for you to begin using Dupixent , an injection every two weeks, to help control your symptoms better. You will receive your first injection in the clinic with training on how to use it. Also, continue using your rescue inhaler and nebulizer as needed, and we will provide further education on their proper use.  -NASAL CONGESTION WITH POSSIBLE SINUS INVOLVEMENT: Chronic nasal congestion can be due to inflammation in your nasal passages and sinuses. You have been experiencing symptoms like ear fullness and blood-tinged mucus. Continue using the Xhance  nasal spray as prescribed. You have an appointment with an ENT specialist on September 4th for further evaluation.  INSTRUCTIONS:  Please ensure you take Symbicort  as prescribed: two puffs in the morning and two puffs in the evening. Continue taking Singulair  as directed. We will start the paperwork for Dupixent , and you will receive your first injection in the clinic with training. Continue using Xhance  nasal spray as prescribed and attend your ENT appointment on September 4th for further evaluation.

## 2024-06-27 NOTE — Progress Notes (Signed)
 [name]    [id number]    Dec 24, 1980  Primary Care Physician:[name], [name], MD  Referring Physician: Gaelle Adriance, MD [address] [place],  [place] [address]  Chief complaint:  Follow up asthma  HPI: 43 y.o. who  has a past medical history of Allergy, Anemia, Arthritis, Asthma, Depression, Edema, GERD (gastroesophageal reflux disease), Headache, Heart murmur, Hypertension, Morbid obesity (HCC), Osteoarthritis, PONV (postoperative nausea and vomiting), Postpartum care following cesarean delivery (12/2) (11/01/2013), Prediabetes, Shortness of breath dyspnea, Sleep apnea, and Swelling.   Discussed the use of AI scribe software for clinical note transcription with the patient, who gave verbal consent to proceed.  History of Present Illness Amber Daniels is a 43 year old female with asthma who presents with worsening respiratory symptoms and congestion.  Since October 2024, she has experienced worsening respiratory symptoms, initially attributing them to allergies due to weather changes. Despite adjusting her allergy medications, her condition did not improve. In December, she underwent surgery and subsequently developed worsening symptoms after Christmas, leading to a diagnosis of walking pneumonia and an upper respiratory infection at [facility]. In February, she visited the emergency room at [facility] due to severe breathing difficulties and wheezing. She received breathing treatments every four hours, which provided temporary relief. However, her symptoms persisted, leading to a second round of prednisone  and antibiotics. She remains congested, attributing some of her symptoms to pollen exposure.  Her asthma history dates back to the 89s, diagnosed around the age of 66 or 58. She was previously on a steroid inhaler but discontinued it when her asthma was under control. She has not been on a regular inhaler regimen recently, using medications as needed. She has  been on Singulair  for years and uses Flonase  for her allergies.  Her family history includes her father, who had COPD, emphysema, and died of lung cancer. 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 She has two children, aged 2 and 58, who also experience allergy symptoms.  She reports congestion and wheezing, but denies current wheezing during the visit. No current use of a steroid inhaler, previously used as needed.  Interim History  Dyspnea and exercise intolerance - Difficulty breathing with frequent episodes of being winded, especially after physical activity such as walking upstairs - Increased use of rescue inhaler and nebulizer due to breathing difficulties  Asthma exacerbations and medication use - Asthma with significant exacerbation approximately four weeks ago, requiring urgent care visit on July 3rd - Treated with prednisone  and nebulizer at urgent care - Three courses of prednisone  in the past year - Current maintenance therapy includes Symbicort , used once or twice daily, with some confusion regarding dosing - Initially prescribed Wiixela due to insurance, later switched to Symbicort  - Airsupra  used as rescue inhaler, particularly during episodes of breathing difficulty - Current use of Singulair   Upper airway symptoms - Significant nasal congestion - Sensation of clogged ears - Mucus production with occasional blood, attributed to nasal issues - Transitioned from Flonase  to Xhance  (fluticasone  nasal spray)  Relevant pulmonary history: Pets: No pets Occupation: High school Software engineer at [employer] Exposures: No mold, hot tub, Jacuzzi.  No feather pillows or comforters Smoking history: Used to smoke in high school Travel history: No significant travel history Relevant family history: Father had COPD and emphysema.  He was a smoker.  Outpatient Encounter Medications as of 06/27/2024  Medication Sig   acetaminophen   (  TYLENOL ) 500 MG tablet Take 1,500 mg by mouth every 6 (six) hours as needed. For pain.   Albuterol -Budesonide  (AIRSUPRA ) 90-80 MCG/ACT AERO Inhale 2 puffs into the lungs every 4 (four) hours as needed (coughing, wheezing, chest tightness). Do not exceed 12 puffs in 24 hours.   B Complex Vitamins (VITAMIN B-COMPLEX) TABS Take by mouth.   budesonide -formoterol  (SYMBICORT ) 160-4.5 MCG/ACT inhaler Inhale 2 puffs into the lungs 2 times daily at 12 noon and 4 pm.   cetirizine  (ZYRTEC ) 10 MG tablet Take 10 mg by mouth daily. (Patient taking differently: Take 10 mg by mouth daily. As needed)   fexofenadine (ALLEGRA) 180 MG tablet Take 180 mg by mouth daily.   fluconazole  (DIFLUCAN ) 150 MG tablet Take 1 tablet (150 mg total) by mouth daily as needed (take at least 3 days between doses as needed for vaginal irritaiton).   Fluocinolone  Acetonide Scalp 0.01 % OIL Apply topically to affected areas   Fluticasone  Propionate (XHANCE ) 93 MCG/ACT EXHU 1-2 sprays per nostril twice a day.   furosemide  (LASIX ) 20 MG tablet Take 1 tablet (20 mg total) by mouth daily as needed.   HYDROcodone -acetaminophen  (HYCET) 7.5-325 mg/15 ml solution    ketoconazole  (NIZORAL ) 2 % shampoo Apply 1 application topically 2 (two) times a week.   levocetirizine (XYZAL) 5 MG tablet Take 5 mg by mouth every evening.   LORazepam  (ATIVAN ) 1 MG tablet Take 1 tablet 30 minutes prior to leaving house on day of office surgery.  Bring second tablet with you to the office.   methylPREDNISolone  (MEDROL  DOSEPAK) 4 MG TBPK tablet Take 6 tablets on day 1, 5 tablets on day 2, 4 tabs on day 3, 3 tabs on day 4, 2 tabs on day 5, 1 tab on day 6. (Patient taking differently: Take 6 tablets on day 1, 5 tablets on day 2, 4 tabs on day 3, 3 tabs on day 4, 2 tabs on day 5, 1 tab on day 6. As needed)   metroNIDAZOLE  (METROGEL ) 0.75 % gel Apply 1 application  topically 2 (two) times daily.   montelukast  (SINGULAIR ) 10 MG tablet Take 1 tablet (10 mg total) by mouth  at bedtime.   Multiple Vitamins-Minerals (BARIATRIC MULTIVITAMINS PO) Take 1 capsule by mouth daily.   ondansetron  (ZOFRAN -ODT) 4 MG disintegrating tablet DISSOLVE 1 TABLET ON THE TONGUE EVERY 8 (EIGHT) HOURS AS NEEDED FOR NAUSEA FOR UP TO 7 DAYS.   ondansetron  (ZOFRAN -ODT) 4 MG disintegrating tablet Take 1 tablet (4 mg total) by mouth every 8 (eight) hours as needed for nausea or vomiting.   Pancrelipase, Lip-Prot-Amyl, (CREON PO) Take by mouth daily.   pantoprazole  (PROTONIX ) 20 MG tablet Take 1 tablet (20 mg total) by mouth daily.   Vitamin D , Ergocalciferol , (DRISDOL ) 1.25 MG (50000 UNIT) CAPS capsule TAKE 1 CAPSULE (50,000 UNITS TOTAL) BY MOUTH EVERY 7 (SEVEN) DAYS.   Fluticasone  Propionate (XHANCE ) 93 MCG/ACT EXHU 1-2 sprays each nostril twice a day (Patient not taking: Reported on 06/27/2024)   sucralfate  (CARAFATE ) 1 g tablet Take 1 tablet (1 g total) by mouth daily as needed. (Patient not taking: Reported on 06/27/2024)   sucralfate  (CARAFATE ) 1 g tablet Take 1 tablet (1 g total) by mouth with breakfast, with lunch, and with evening meal for 14 days. (Patient not taking: Reported on 06/27/2024)   No facility-administered encounter medications on file as of 06/27/2024.   Physical Exam: Blood pressure 100/80, pulse 78, height 5' 3 (1.6 m), weight 188 lb (85.3 kg), SpO2 100%. Gen:  No acute distress HEENT:  EOMI, sclera anicteric Neck:     No masses; no thyromegaly Lungs:    Clear to auscultation bilaterally; normal respiratory effort CV:         Regular rate and rhythm; no murmurs Abd:      + bowel sounds; soft, non-tender; no palpable masses, no distension Ext:    No edema; adequate peripheral perfusion Neuro: alert and oriented x 3 Psych: normal mood and affect   Data Reviewed: Imaging: CT abdomen pelvis 11/28/2022-visualized lungs are unremarkable Chest x-ray 01/12/2024-no acute cardiopulmonary abnormality I reviewed the images personally.  PFTs: 04/04/2015 FVC 2.84, FEV1  2.47 [96%), F/F85 Normal test  06/27/2024 FVC 3.26 [92%], FEV1 2.63 [91%], F/F81, TLC 5.58 [113%], DLCO 25.79 [124%] Minimal obstructive airway disease with bronchodilator response  Labs: CBC  01/11/2024-WBC 3.0, eos 7%, absolute eosinophil count 210 03/14/2024-WBC 5.5, eos 8%, absolute eosinophil count 440  RAST panel/14/2025-IgE 356, sensitive to grass pollen, dog dander. Borderline to trees pollen and dust mites  Assessment & Plan Severe persistent asthma Asthma with frequent exacerbations requiring multiple courses of prednisone  in the past year. Despite current treatment with Symbicort , Singulair , and rescue inhalers, symptoms are not well controlled. Recent visit to urgent care on July 3rd due to breathing difficulties. Lung function test today shows improvement post-rescue inhaler, consistent with asthma. Considering escalation of care due to persistent symptoms. - Ensure Symbicort  is taken as prescribed: two puffs in the morning and two puffs in the evening. - Continue Singulair  as prescribed. - Initiate paperwork for Dupixent , a self-administered injection every two weeks, to better control asthma symptoms. - Educate on proper use of rescue inhaler and nebulizer. - First Dupixent  injection to be administered in clinic with training provided.  Nasal congestion with possible sinus involvement Chronic nasal congestion with associated symptoms of ear fullness and blood-tinged mucus. Currently using Xhance , a fluticasone  nasal spray, as prescribed by asthma and allergy specialist. Referral to ENT scheduled for further evaluation on September 4th. - Continue Xhance  nasal spray as prescribed. - Attend ENT appointment on September 4th for further evaluation.  Recommendations: Continue Symbicort , start Dupixent  Continue Singulair , fluticasone  nasal spray, OTC antihistamine PFTs in 3 months.  Lonna Coder MD [facility] 06/27/2024, 10:22 AM  CC: Yacine Droz,  Anyelina Claycomb, MD

## 2024-06-29 ENCOUNTER — Telehealth: Payer: Self-pay

## 2024-06-29 ENCOUNTER — Other Ambulatory Visit (HOSPITAL_COMMUNITY): Payer: Self-pay

## 2024-06-29 NOTE — Telephone Encounter (Signed)
 Called patient in regards to her Dupixent  new start appointment. Will start 06/30/2024 at 1:30 pm.   Deleta Colt PharmD Candidate 2026  [facility]

## 2024-06-29 NOTE — Telephone Encounter (Signed)
 Patient scheduled for DPX new start on 06/30/2024. Will use sample  Tahari Clabaugh, PharmD, MPH, BCPS, CPP Clinical Pharmacist (Rheumatology and Pulmonology)

## 2024-06-29 NOTE — Telephone Encounter (Signed)
 Received New start paperwork for DUPIXENT . Will update as we work through the benefits process.  Submitted a Prior Authorization request to [facility] for DUPIXENT  via CoverMyMeds. Will update once we receive a response.  Key: [employer]

## 2024-06-29 NOTE — Telephone Encounter (Signed)
 Received notification from [facility] regarding a prior authorization for DUPIXENT . Authorization has been APPROVED from 06/29/2024 to 12/26/2024. Approval letter sent to scan center.  Patient must fill through [facility]: [phone]  Authorization # [id number]   Enrolled pt into the copay card program via hotline.  BIN: [id number] PCN: Loyalty GRP: [id number] ID: [id number]  Copy of copay card will be sent to scan center for reference.

## 2024-06-30 ENCOUNTER — Ambulatory Visit (INDEPENDENT_AMBULATORY_CARE_PROVIDER_SITE_OTHER): Admitting: Pharmacist

## 2024-06-30 DIAGNOSIS — J4550 Severe persistent asthma, uncomplicated: Secondary | ICD-10-CM

## 2024-06-30 DIAGNOSIS — Z7189 Other specified counseling: Secondary | ICD-10-CM | POA: Diagnosis not present

## 2024-06-30 MED ORDER — DUPIXENT 300 MG/2ML ~~LOC~~ SOAJ: 300.0000 mg | SUBCUTANEOUS | 1 refills | Status: DC

## 2024-06-30 NOTE — Progress Notes (Signed)
 HPI Patient presents today to [facility] to see pharmacy team for Dupixent  new start.  Past medical history includes Allergic Asthma, GERD, Hypertension, Shortness of breath dyspnea, and Sleep apnea.  Respiratory Medications Current regimen: Symbicort  160-4.5 mcg/act inhaler. Inhale two puffs in the morning and two puffs in the evening. Singulair  10 mg tablets. Take 1 tablet by mouth at bedtime. Tried in past: [place] Patient reports no known adherence challenges  OBJECTIVE Allergies  Allergen Reactions   Bee Venom    Other     Seasonal and enviromental allergies (dust)    Outpatient Encounter Medications as of 06/30/2024  Medication Sig Note   acetaminophen  (TYLENOL ) 500 MG tablet Take 1,500 mg by mouth every 6 (six) hours as needed. For pain.    Albuterol -Budesonide  (AIRSUPRA ) 90-80 MCG/ACT AERO Inhale 2 puffs into the lungs every 4 (four) hours as needed (coughing, wheezing, chest tightness). Do not exceed 12 puffs in 24 hours.    B Complex Vitamins (VITAMIN B-COMPLEX) TABS Take by mouth.    budesonide -formoterol  (SYMBICORT ) 160-4.5 MCG/ACT inhaler Inhale 2 puffs into the lungs 2 times daily at 12 noon and 4 pm.    cetirizine  (ZYRTEC ) 10 MG tablet Take 10 mg by mouth daily. (Patient taking differently: Take 10 mg by mouth daily. As needed)    fexofenadine (ALLEGRA) 180 MG tablet Take 180 mg by mouth daily.    fluconazole  (DIFLUCAN ) 150 MG tablet Take 1 tablet (150 mg total) by mouth daily as needed (take at least 3 days between doses as needed for vaginal irritaiton).    Fluocinolone  Acetonide Scalp 0.01 % OIL Apply topically to affected areas    Fluticasone  Propionate (XHANCE ) 93 MCG/ACT EXHU 1-2 sprays per nostril twice a day.    Fluticasone  Propionate (XHANCE ) 93 MCG/ACT EXHU 1-2 sprays each nostril twice a day (Patient not taking: Reported on 06/27/2024)    furosemide  (LASIX ) 20 MG tablet Take 1 tablet (20 mg total) by mouth daily as needed.    HYDROcodone -acetaminophen   (HYCET) 7.5-325 mg/15 ml solution     ketoconazole  (NIZORAL ) 2 % shampoo Apply 1 application topically 2 (two) times a week.    levocetirizine (XYZAL) 5 MG tablet Take 5 mg by mouth every evening.    LORazepam  (ATIVAN ) 1 MG tablet Take 1 tablet 30 minutes prior to leaving house on day of office surgery.  Bring second tablet with you to the office.    methylPREDNISolone  (MEDROL  DOSEPAK) 4 MG TBPK tablet Take 6 tablets on day 1, 5 tablets on day 2, 4 tabs on day 3, 3 tabs on day 4, 2 tabs on day 5, 1 tab on day 6. (Patient taking differently: Take 6 tablets on day 1, 5 tablets on day 2, 4 tabs on day 3, 3 tabs on day 4, 2 tabs on day 5, 1 tab on day 6. As needed)    metroNIDAZOLE  (METROGEL ) 0.75 % gel Apply 1 application  topically 2 (two) times daily. 12/16/2019: As needed   montelukast  (SINGULAIR ) 10 MG tablet Take 1 tablet (10 mg total) by mouth at bedtime.    Multiple Vitamins-Minerals (BARIATRIC MULTIVITAMINS PO) Take 1 capsule by mouth daily.    ondansetron  (ZOFRAN -ODT) 4 MG disintegrating tablet DISSOLVE 1 TABLET ON THE TONGUE EVERY 8 (EIGHT) HOURS AS NEEDED FOR NAUSEA FOR UP TO 7 DAYS.    ondansetron  (ZOFRAN -ODT) 4 MG disintegrating tablet Take 1 tablet (4 mg total) by mouth every 8 (eight) hours as needed for nausea or vomiting.    Pancrelipase, Lip-Prot-Amyl, (  CREON PO) Take by mouth daily. 12/16/2019: Take 36000-114000-180000   pantoprazole  (PROTONIX ) 20 MG tablet Take 1 tablet (20 mg total) by mouth daily.    sucralfate  (CARAFATE ) 1 g tablet Take 1 tablet (1 g total) by mouth daily as needed. (Patient not taking: Reported on 06/27/2024)    sucralfate  (CARAFATE ) 1 g tablet Take 1 tablet (1 g total) by mouth with breakfast, with lunch, and with evening meal for 14 days. (Patient not taking: Reported on 06/27/2024)    Vitamin D , Ergocalciferol , (DRISDOL ) 1.25 MG (50000 UNIT) CAPS capsule TAKE 1 CAPSULE (50,000 UNITS TOTAL) BY MOUTH EVERY 7 (SEVEN) DAYS.    No facility-administered encounter  medications on file as of 06/30/2024.     Immunization History  Administered Date(s) Administered   Influenza,inj,Quad PF,6+ Mos 10/20/2014, 08/20/2019   Influenza-Unspecified 08/02/2013, 09/04/2018, 08/18/2019   Pneumococcal Polysaccharide-23 11/04/2013   Tdap 08/19/2013     PFTs    Latest Ref Rng & Units 06/27/2024    8:43 AM  PFT Results  FVC-Pre L 3.23   FVC-Predicted Pre % 91   FVC-Post L 3.26   FVC-Predicted Post % 92   Pre FEV1/FVC % % 72   Post FEV1/FCV % % 81   FEV1-Pre L 2.33   FEV1-Predicted Pre % 81   FEV1-Post L 2.63   DLCO uncorrected ml/min/mmHg 25.97   DLCO UNC% % 124   DLVA Predicted % 129   TLC L 5.58   TLC % Predicted % 113   RV % Predicted % 147      Eosinophils Most recent blood eosinophil count was 440 taken on 03/14/2024.   IgE: 356 on 03/14/2024   Assessment   Biologics training for dupilumab  (Dupixent )  Goals of therapy: Mechanism: human monoclonal IgG4 antibody that inhibits interleukin-4 and interleukin-13 cytokine-induced responses, including release of proinflammatory cytokines, chemokines, and IgE Reviewed that Dupixent  is add-on medication and patient must continue maintenance inhaler regimen. Response to therapy: may take 4 months to determine efficacy. Discussed that patients generally feel improvement sooner than 4 months.  Side effects: injection site reaction (6-18%), antibody development (5-16%), ophthalmic conjunctivitis (2-16%), transient blood eosinophilia (1-2%)  Dose: 600mg  at Week 0 (administered today in clinic) followed by 300mg  every 14 days thereafter  Administration/Storage:  Reviewed administration sites of thigh or abdomen (at least 2-3 inches away from abdomen). Reviewed the upper arm is only appropriate if caregiver is administering injection  Do not shake pen/syringe as this could lead to product foaming or precipitation. Do not use if solution is discolored or contains particulate matter or if window on  prefilled pen is yellow (indicates pen has been used).  Reviewed storage of medication in refrigerator. Reviewed that Dupixent  can be stored at room temperature in unopened carton for up to 14 days.  Access: Approval of Dupixent  through: insurance Patient enrolled into copay card program to help with copay assistance.  Patient self-administered Dupixent  300mg /44ml x 2 (total dose 600mg ) in right lower abdomen and left lower abdomen using sample Dupixent  300mg /61mL autoinjector pen NDC: 9975-4084-79 Lot: [id number] Expiration: 06/30/2026  Patient monitored for 30 minutes for adverse reaction.  Patient tolerated injection well. Noted some burning on injection. Injection site noted. Patient denies itchiness and irritation at injection., No swelling or redness noted., and Reviewed injection site reaction management with patient verbally and printed information for review in AVS  Medication Reconciliation  A drug regimen assessment was performed, including review of allergies, interactions, disease-state management, dosing and immunization history. Medications were reviewed  with the patient, including name, instructions, indication, goals of therapy, potential side effects, importance of adherence, and safe use.  Drug interaction(s): none  PLAN  Continue Dupixent  300 mg every 14 days.  Next dose is due 07/14/2024 and every 14 days thereafter. [facility]: [phone].  Patient provided with pharmacy phone number and advised to call later this week to schedule shipment to home. Patient provided with copay card information to provide to pharmacy if quoted copay exceeds $5 per month. Continue maintenance inhaler regimen of: Symbicort  160-4.5 mcg/act inhaler. Inhale two puffs in the morning and two puffs in the evening. Singulair  10 mg tablets. Take 1 tablet by mouth at bedtime.  All questions encouraged and answered.  Instructed patient to reach out with any further questions or  concerns.  Thank you for allowing pharmacy to participate in this patient's care.  This appointment required 45 minutes of patient care (this includes precharting, chart review, review of results, face-to-face care, etc.).

## 2024-06-30 NOTE — Patient Instructions (Addendum)
 Your next Dupixent  dose is due on 07/14/2024, 07/28/2024, and every 14 days thereafter  CONTINUE Symbicort  160-4.5 mcg/act inhaler. Inhale two puffs in the morning and two puffs in the evening. Singulair  10 mg tablets. Take 1 tablet by mouth at bedtime.  Your prescription will be shipped from [facility]. Their phone number is [phone] Please call to schedule shipment and confirm address. They will mail your medication to your home.  Your copay should be affordable. If you call the pharmacy and it is not affordable, please double-check that they are billing through your copay card as secondary coverage. That copay card information is: ID: [id number] BIN: [id number] PCN: [employer]ty Group Number: [id number]  You will need to be seen by your provider in 3 to 4 months to assess how Dupixent  is working for you. Please ensure you have a follow-up appointment scheduled in 3 months. Call our clinic if you need to make this appointment.  Stay up to date on all routine vaccines: influenza, pneumonia, COVID19, Shingles.  How to manage an injection site reaction: Remember the 5 C's: COUNTER - leave on the counter at least 30 minutes but up to overnight to bring medication to room temperature. This may help prevent stinging COLD - place something cold (like an ice gel pack or cold water bottle) on the injection site just before cleansing with alcohol. This may help reduce pain CLARITIN - use Claritin (generic name is loratadine) for the first two weeks of treatment or the day of, the day before, and the day after injecting. This will help to minimize injection site reactions CORTISONE CREAM - apply if injection site is irritated and itching CALL ME - if injection site reaction is bigger than the size of your fist, looks infected, blisters, or if you develop hives

## 2024-07-05 ENCOUNTER — Ambulatory Visit (INDEPENDENT_AMBULATORY_CARE_PROVIDER_SITE_OTHER): Admitting: Family

## 2024-07-05 ENCOUNTER — Other Ambulatory Visit: Payer: Self-pay

## 2024-07-05 ENCOUNTER — Ambulatory Visit: Payer: Self-pay | Admitting: Family

## 2024-07-05 ENCOUNTER — Encounter: Payer: Self-pay | Admitting: Pulmonary Disease

## 2024-07-05 ENCOUNTER — Ambulatory Visit (HOSPITAL_COMMUNITY)
Admission: RE | Admit: 2024-07-05 | Discharge: 2024-07-05 | Disposition: A | Discharge status: Home / Self Care | Source: Ambulatory Visit | Attending: Family | Admitting: Family

## 2024-07-05 ENCOUNTER — Encounter: Payer: Self-pay | Admitting: Family

## 2024-07-05 ENCOUNTER — Encounter: Payer: Self-pay | Admitting: Allergy

## 2024-07-05 VITALS — BP 100/66 | HR 76 | Temp 98.2°F

## 2024-07-05 DIAGNOSIS — R0602 Shortness of breath: Secondary | ICD-10-CM

## 2024-07-05 DIAGNOSIS — R042 Hemoptysis: Secondary | ICD-10-CM | POA: Diagnosis present

## 2024-07-05 DIAGNOSIS — J328 Other chronic sinusitis: Secondary | ICD-10-CM

## 2024-07-05 DIAGNOSIS — J4550 Severe persistent asthma, uncomplicated: Secondary | ICD-10-CM | POA: Diagnosis not present

## 2024-07-05 DIAGNOSIS — J019 Acute sinusitis, unspecified: Secondary | ICD-10-CM | POA: Diagnosis not present

## 2024-07-05 DIAGNOSIS — R21 Rash and other nonspecific skin eruption: Secondary | ICD-10-CM

## 2024-07-05 DIAGNOSIS — B999 Unspecified infectious disease: Secondary | ICD-10-CM

## 2024-07-05 DIAGNOSIS — J3089 Other allergic rhinitis: Secondary | ICD-10-CM

## 2024-07-05 MED ORDER — BUDESONIDE 0.5 MG/2ML IN SUSP: RESPIRATORY_TRACT | 2 refills | Status: AC

## 2024-07-05 MED ORDER — AMOXICILLIN-POT CLAVULANATE 875-125 MG PO TABS: 1.0000 | ORAL_TABLET | Freq: Two times a day (BID) | ORAL | 0 refills | Status: AC

## 2024-07-05 NOTE — Telephone Encounter (Signed)
 I called the patient to come in to be evaluated. Appt has been scheduled.

## 2024-07-05 NOTE — Telephone Encounter (Signed)
**Note De-identified  [place] Obfuscation** Please advise 

## 2024-07-05 NOTE — Progress Notes (Signed)
 Please let Amber Daniels know that her chest xray is normal. This is good new!

## 2024-07-05 NOTE — Patient Instructions (Addendum)
 Severe persistent asthma Your breathing test looked good today and did not improve much after 4 puffs of Xopenex  If symptoms not get better please go to the emergency room Will get STAT chest x-ray due to hemoptysis. We will call you with results once they are back. Continue to follow up with pulmonology Daily controller medication(s): continue Symbicort  160mcg 2 puffs twice a day with spacer and rinse mouth afterwards. Reviewed proper technique.  Continue Singulair  (montelukast ) 10mg  daily at night. During upper respiratory infection/asthma flares and now start Pulmicort  (budesonide ) 0.5 mg twice a day via nebulizer for 1 to 2 weeks.  While Pulmicort  in nebulizer do not use Airsupra  for rescue inhaler.  You may use DuoNeb (albuterol /ipratropium bromide) or albuterol  as needed. May use Airsupra  rescue inhaler 2 puffs every 4 to 6 hours as needed for shortness of breath, chest tightness, coughing, and wheezing. Do not use more than 12 puffs in 24 hours. May use Airsupra  rescue inhaler 2 puffs 5 to 15 minutes prior to strenuous physical activities. Rinse mouth after each use.  Monitor frequency of use - if you need to use it more than twice per week on a consistent basis let us  know.  Breathing control goals:  Full participation in all desired activities (may need albuterol  before activity) Albuterol  use two times or less a week on average (not counting use with activity) Cough interfering with sleep two times or less a month Oral steroids no more than once a year No hospitalizations  Paper work for  Dupixent  initiated at her pulmonologist office. First Dupixent  injection on 06/30/24.  Continue Dupixent  injections If symptoms persist after starting Dupixent  consider imaging and lab work for hard to control asthma  Allergic rhinitis/chronic sinusitis with acute infection Start Augmentin  875 mg taking 1 tablet twice a day for 7 days Continue Xhance  (fluticasone ) nasal spray 1-2 sprays per nostril  twice a day as needed for nasal congestion.  Nasal saline spray (i.e., Simply Saline) or nasal saline lavage (i.e., NeilMed) is recommended as needed and prior to medicated nasal sprays.  Only use afrin when you are really congested for a few days in a row max. Continue Singulair  (montelukast ) 10mg  daily at night. Use over the counter antihistamines such as Zyrtec  (cetirizine ), Claritin (loratadine), Allegra (fexofenadine), or Xyzal (levocetirizine) daily as needed. May take twice a day during allergy flares. May switch antihistamines every few months. Blood work to environmental allergies was positive to grass pollen, dog dander. Borderline to trees pollen and dust mites.  Recommended appointment with ENT. Keep upcoming appointment with Dr. Karis on 08/04/24   Rash  Keep track of rashes and take pictures. Write down what you had done/eaten during flares.  See below for proper skin care. Use fragrance free and dye free products. No dryer sheets or fabric softener.    Reflux Continue lifestyle and dietary modifications.  Recurrent infections Keep track of infections and antibiotics use. If persistent will get bloodwork next to look at immune system.   Follow up in 2-4 weeks with Dr. Luke or sooner if needed.  Skin care recommendations  Bath time: Always use lukewarm water. AVOID very hot or cold water. Keep bathing time to 5-10 minutes. Do NOT use bubble bath. Use a mild soap and use just enough to wash the dirty areas. Do NOT scrub skin vigorously.  After bathing, pat dry your skin with a towel. Do NOT rub or scrub the skin.  Moisturizers and prescriptions:  ALWAYS apply moisturizers immediately after bathing (within  3 minutes). This helps to lock-in moisture. Use the moisturizer several times a day over the whole body. Good summer moisturizers include: Aveeno, CeraVe, Cetaphil. Good winter moisturizers include: Aquaphor, Vaseline, Cerave, Cetaphil, Eucerin, Vanicream. When using  moisturizers along with medications, the moisturizer should be applied about one hour after applying the medication to prevent diluting effect of the medication or moisturize around where you applied the medications. When not using medications, the moisturizer can be continued twice daily as maintenance.  Laundry and clothing: Avoid laundry products with added color or perfumes. Use unscented hypo-allergenic laundry products such as Tide free, Cheer free & gentle, and All free and clear.  If the skin still seems dry or sensitive, you can try double-rinsing the clothes. Avoid tight or scratchy clothing such as wool. Do not use fabric softeners or dyer sheets.   Buffered Isotonic Saline Irrigations:  Goal: When you irrigate with the isotonic saline (salt water) it washes mucous and other debris from your nose that could be contributing to your nasal symptoms.   Recipe: Obtain 1 quart jar that is clean Fill with clean (bottled, boiled or distilled) water Add 1-2 heaping teaspoons of salt without iodine If the solution with 2 teaspoons of salt is too strong, adjust the amount down until better tolerated Add 1 teaspoon of [employer] baking soda (pure bicarbonate) Mix ingredients together and store at room temperature and discard after 1 week * Alternatively you can buy pre made salt packets for the NeilMed bottle or there          are other over the counter brands available  Instructions: Warm  cup of the solution in the microwave if desired but be careful not to overheat as this will burn the inside of your nose Stand over a sink (or do it while you shower) and squirt the solution into one side of your nose aiming towards the back of your head Sometimes saying "coca cola" while irrigating can be helpful to prevent fluid from going down your throat  The solution will travel to the back of your nose and then come out the other side Perform this again on the other side Try to do this twice  a day If you are using a nasal spray in addition to the irrigation, irrigate first and then use the topical nasal spray otherwise you will wash the nasal spray out of your nose

## 2024-07-05 NOTE — Progress Notes (Signed)
 [address] [place] [address] Dept: [phone]  FOLLOW UP NOTE  Patient ID: Amber Daniels, female    DOB: 27-Jun-1981  Age: 43 y.o. MRN: [id number] Date of Office Visit: 07/05/2024  Assessment  Chief Complaint: Nasal Congestion (Fluid in both ears /Cold chills body pains ), Breathing Problem, Wheezing, Cough (With yellow with blood phlegm sore throat/Hoarseness in throat), and Headache  HPI Amber Daniels is a 43 year old female who presents today for acute visit.  She was last seen on March 14, 2024 by Dr. Luke for not well-controlled severe persistent asthma with acute exacerbation, allergic rhinitis, chronic sinusitis, gastroesophageal reflux disease, recurrent infections, and rash.  She did not follow-up in 4 weeks as recommended.  She denies any new diagnosis or surgery since her last office visit.  She reports that since July 22 she has been having a dry cough during the day, stuffy nose with yellow-brown snot with blood in it , coughing up phlegm the same color and it also has blood in the mucus.  She also reports headaches, body aches and chills.  She denies fevers or known sick contacts.  She reports that she frequently checks for COVID and it has been negative.  She has not checked for influenza.  She also reports her ears feel underwater and if she holds her nose to clear her ears it will help some.  Her voice will come and go.  She does not have an upcoming appointment with ENT, Dr. Karis, until September.  Asthma: She started her first Dupixent  injection on June 30, 2024 that was initiated by her pulmonologist.  She continues to take Symbicort  160/4.5 mcg 2 puffs twice a day the spacer, Singulair  10 mg at night, and DuoNeb as needed.  She also has Airsupra  that she has to use as needed.  She reports a dry cough, plenty of wheezing, shortness of breath/winded, and a little tightness in her chest.  She has been on 1 round of steroids since we last saw her on July 3.  She  reports that it helps while she is on it, but then her symptoms come back once she is off.  She has made 1 trip to the emergency room on July 3 due to breathing problems.  She last used her nebulizer with DuoNeb around last night.  She finds that this helps better.  She last used her Airsupra  around noon today.  She denies any recent long distance travel or surgeries.  She does not have any history of blood clots, but mentions her brother does.  She takes Lasix  every day for swelling.  Allergic rhinitis: She is currently taking Xhance  2 sprays each nostril once a day, Singulair  10 mg once a day, and Xyzal 5 mg once a day.  She alternates between antihistamines.  She reports nasal congestion with yellow-brown snot with blood in it.  She also has a sensation of her ears feel like they are underwater.  If she holds her nose to clear her ears it helps some.  Gastroesophageal reflux disease is reported as under control for the most part.  She does not take any medications for reflux.  Recurrent infections: She reports that she has not had any infections since we last saw her.  She does however mention that she will get boils on her skin and her primary care physician has sent in a prescription for amoxicillin  for her to take should they occur again.  Rash: She reports that she always has a  rash due to her loose skin.  She has tried all the creams.   Drug Allergies:  Allergies  Allergen Reactions   Bee Venom    Other     Seasonal and enviromental allergies (dust)    Review of Systems: Negative except as per HPI   Physical Exam: BP 100/66   Pulse 76   Temp 98.2 F (36.8 C)   SpO2 100%    Physical Exam Constitutional:      Appearance: Normal appearance.  HENT:     Head: Normocephalic and atraumatic.     Comments: Pharynx normal, eyes normal, ears normal, nose: Bilateral lower turbinates moderately edematous and pale with clear drainage noted    Right Ear: Tympanic membrane, ear canal  and external ear normal.     Left Ear: Tympanic membrane, ear canal and external ear normal.     Mouth/Throat:     Mouth: Mucous membranes are moist.     Pharynx: Oropharynx is clear.  Eyes:     Conjunctiva/sclera: Conjunctivae normal.  Cardiovascular:     Rate and Rhythm: Regular rhythm.     Heart sounds: Normal heart sounds.  Pulmonary:     Breath sounds: Normal breath sounds.     Comments: Lungs clear to auscultation Musculoskeletal:     Cervical back: Neck supple.  Skin:    General: Skin is warm.     Comments: 1 small hyperpigmented area noted on the lower abdomen  Neurological:     Mental Status: She is alert and oriented to person, place, and time.  Psychiatric:        Mood and Affect: Mood normal.        Behavior: Behavior normal.        Thought Content: Thought content normal.        Judgment: Judgment normal.     Diagnostics: FVC 3.10 L (104%), FEV1 2.44 L (100%), FEV1/FVC 0.79 L.  Spirometry indicates normal spirometry.  4 puffs of Xopenex  given.  Postbronchodilator response shows FVC 3.26 L (109%), FEV1 2.60 L (107%).  There is a 6.5% change in FEV1.  Spirometry indicates normal spirometry.  She reports that her breathing feels a little bit better after the 4 puffs of Xopenex   Assessment and Plan: 1. Acute non-recurrent sinusitis, unspecified location   2. Not well controlled severe persistent asthma   3. Hemoptysis   4. Shortness of breath   5. Other allergic rhinitis   6. Recurrent infections   7. Other chronic sinusitis   8. Rash and other nonspecific skin eruption     Meds ordered this encounter  Medications   amoxicillin -clavulanate (AUGMENTIN ) 875-125 MG tablet    Sig: Take 1 tablet by mouth 2 (two) times daily.    Dispense:  14 tablet    Refill:  0   budesonide  (PULMICORT ) 0.5 MG/2ML nebulizer solution    Sig: During upper respiratory infection/asthma flares use 1 vial via nebulizer twice a day for 1 to 2 weeks    Dispense:  120 mL    Refill:  2     Patient Instructions   Severe persistent asthma Your breathing test looked good today and did not improve much after 4 puffs of Xopenex  If symptoms not get better please go to the emergency room Will get STAT chest x-ray due to hemoptysis. We will call you with results once they are back. Continue to follow up with pulmonology Daily controller medication(s): continue Symbicort  160mcg 2 puffs twice a day with spacer and rinse  mouth afterwards. Reviewed proper technique.  Continue Singulair  (montelukast ) 10mg  daily at night. During upper respiratory infection/asthma flares and now start Pulmicort  (budesonide ) 0.5 mg twice a day via nebulizer for 1 to 2 weeks.  While Pulmicort  in nebulizer do not use Airsupra  for rescue inhaler.  You may use DuoNeb (albuterol /ipratropium bromide) or albuterol  as needed. May use Airsupra  rescue inhaler 2 puffs every 4 to 6 hours as needed for shortness of breath, chest tightness, coughing, and wheezing. Do not use more than 12 puffs in 24 hours. May use Airsupra  rescue inhaler 2 puffs 5 to 15 minutes prior to strenuous physical activities. Rinse mouth after each use.  Monitor frequency of use - if you need to use it more than twice per week on a consistent basis let us  know.  Breathing control goals:  Full participation in all desired activities (may need albuterol  before activity) Albuterol  use two times or less a week on average (not counting use with activity) Cough interfering with sleep two times or less a month Oral steroids no more than once a year No hospitalizations  Paper work for  Dupixent  initiated at her pulmonologist office. First Dupixent  injection on 06/30/24.  Continue Dupixent  injections If symptoms persist after starting Dupixent  consider imaging and lab work for hard to control asthma  Allergic rhinitis/chronic sinusitis with acute infection Start Augmentin  875 mg taking 1 tablet twice a day for 7 days Continue Xhance  (fluticasone ) nasal  spray 1-2 sprays per nostril twice a day as needed for nasal congestion.  Nasal saline spray (i.e., Simply Saline) or nasal saline lavage (i.e., NeilMed) is recommended as needed and prior to medicated nasal sprays.  Only use afrin when you are really congested for a few days in a row max. Continue Singulair  (montelukast ) 10mg  daily at night. Use over the counter antihistamines such as Zyrtec  (cetirizine ), Claritin (loratadine), Allegra (fexofenadine), or Xyzal (levocetirizine) daily as needed. May take twice a day during allergy flares. May switch antihistamines every few months. Blood work to environmental allergies was positive to grass pollen, dog dander. Borderline to trees pollen and dust mites.  Recommended appointment with ENT. Keep upcoming appointment with Dr. Karis on 08/04/24   Rash  Keep track of rashes and take pictures. Write down what you had done/eaten during flares.  See below for proper skin care. Use fragrance free and dye free products. No dryer sheets or fabric softener.    Reflux Continue lifestyle and dietary modifications.  Recurrent infections Keep track of infections and antibiotics use. If persistent will get bloodwork next to look at immune system.   Follow up in 2-4 weeks with Dr. Luke or sooner if needed.  Skin care recommendations  Bath time: Always use lukewarm water. AVOID very hot or cold water. Keep bathing time to 5-10 minutes. Do NOT use bubble bath. Use a mild soap and use just enough to wash the dirty areas. Do NOT scrub skin vigorously.  After bathing, pat dry your skin with a towel. Do NOT rub or scrub the skin.  Moisturizers and prescriptions:  ALWAYS apply moisturizers immediately after bathing (within 3 minutes). This helps to lock-in moisture. Use the moisturizer several times a day over the whole body. Good summer moisturizers include: Aveeno, CeraVe, Cetaphil. Good winter moisturizers include: Aquaphor, Vaseline, Cerave, Cetaphil,  Eucerin, Vanicream. When using moisturizers along with medications, the moisturizer should be applied about one hour after applying the medication to prevent diluting effect of the medication or moisturize around where you applied the medications.  When not using medications, the moisturizer can be continued twice daily as maintenance.  Laundry and clothing: Avoid laundry products with added color or perfumes. Use unscented hypo-allergenic laundry products such as Tide free, Cheer free & gentle, and All free and clear.  If the skin still seems dry or sensitive, you can try double-rinsing the clothes. Avoid tight or scratchy clothing such as wool. Do not use fabric softeners or dyer sheets.   Buffered Isotonic Saline Irrigations:  Goal: When you irrigate with the isotonic saline (salt water) it washes mucous and other debris from your nose that could be contributing to your nasal symptoms.   Recipe: Obtain 1 quart jar that is clean Fill with clean (bottled, boiled or distilled) water Add 1-2 heaping teaspoons of salt without iodine If the solution with 2 teaspoons of salt is too strong, adjust the amount down until better tolerated Add 1 teaspoon of [employer] baking soda (pure bicarbonate) Mix ingredients together and store at room temperature and discard after 1 week * Alternatively you can buy pre made salt packets for the NeilMed bottle or there          are other over the counter brands available  Instructions: Warm  cup of the solution in the microwave if desired but be careful not to overheat as this will burn the inside of your nose Stand over a sink (or do it while you shower) and squirt the solution into one side of your nose aiming towards the back of your head Sometimes saying "coca cola" while irrigating can be helpful to prevent fluid from going down your throat  The solution will travel to the back of your nose and then come out the other side Perform this again on the  other side Try to do this twice a day If you are using a nasal spray in addition to the irrigation, irrigate first and then use the topical nasal spray otherwise you will wash the nasal spray out of your nose  Return in about 4 weeks (around 08/02/2024), or if symptoms worsen or fail to improve.    Thank you for the opportunity to care for this patient.  Please do not hesitate to contact me with questions.  Wanda Craze, FNP [facility] 

## 2024-07-06 NOTE — Addendum Note (Signed)
 Addended by: NANCEE JON SAILOR on: 07/06/2024 06:02 PM   Modules accepted: Orders

## 2024-07-12 ENCOUNTER — Encounter: Payer: Self-pay | Admitting: Allergy

## 2024-07-12 NOTE — Telephone Encounter (Signed)
 Please get clarification about the amount of blood the patient is coughing up and if there are any other symptoms like shortness of breath dyspnea, fevers.  Please make an acute visit if possible for further evaluation

## 2024-07-18 ENCOUNTER — Other Ambulatory Visit: Payer: Self-pay | Admitting: Family Medicine

## 2024-07-18 DIAGNOSIS — Z1231 Encounter for screening mammogram for malignant neoplasm of breast: Secondary | ICD-10-CM

## 2024-07-26 ENCOUNTER — Other Ambulatory Visit: Payer: Self-pay | Admitting: Family

## 2024-07-28 ENCOUNTER — Encounter: Payer: Self-pay | Admitting: Allergy

## 2024-07-29 ENCOUNTER — Ambulatory Visit
Admission: RE | Admit: 2024-07-29 | Discharge: 2024-07-29 | Disposition: A | Discharge status: Home / Self Care | Source: Ambulatory Visit

## 2024-07-29 DIAGNOSIS — Z1231 Encounter for screening mammogram for malignant neoplasm of breast: Secondary | ICD-10-CM

## 2024-08-04 ENCOUNTER — Encounter (INDEPENDENT_AMBULATORY_CARE_PROVIDER_SITE_OTHER): Payer: Self-pay | Admitting: Otolaryngology

## 2024-08-04 ENCOUNTER — Ambulatory Visit (INDEPENDENT_AMBULATORY_CARE_PROVIDER_SITE_OTHER): Admitting: Otolaryngology

## 2024-08-04 VITALS — BP 118/80 | HR 68

## 2024-08-04 DIAGNOSIS — R0981 Nasal congestion: Secondary | ICD-10-CM

## 2024-08-04 DIAGNOSIS — J343 Hypertrophy of nasal turbinates: Secondary | ICD-10-CM

## 2024-08-04 DIAGNOSIS — J310 Chronic rhinitis: Secondary | ICD-10-CM | POA: Diagnosis not present

## 2024-08-04 DIAGNOSIS — J324 Chronic pansinusitis: Secondary | ICD-10-CM | POA: Insufficient documentation

## 2024-08-04 DIAGNOSIS — J342 Deviated nasal septum: Secondary | ICD-10-CM | POA: Diagnosis not present

## 2024-08-04 MED ORDER — PREDNISONE 10 MG (48) PO TBPK: ORAL_TABLET | ORAL | 0 refills | Status: AC

## 2024-08-04 NOTE — Progress Notes (Unsigned)
 CC: Recurrent sinusitis, chronic nasal obstruction  HPI:  Amber Daniels is a 43 y.o. female who presents today complaining of recurrent sinusitis and chronic nasal obstruction.  She has been symptomatic for more than 1 year.  She complains of frequent facial pressure, nasal congestion, and nasal drainage.  She has a history of environmental allergies and asthma.  She was treated with at least 4 courses of steroids and antibiotics.  She is currently on Dupixent , Xhance , Xyzal, and Singulair .  Despite the treatment, she continues to be symptomatic.  Past Medical History:  Diagnosis Date   Allergy    Anemia    during pregnancy    Arthritis    Asthma    Depression    Edema    lower extremities    GERD (gastroesophageal reflux disease)    Headache    occasional migraine    Heart murmur    Hypertension    Morbid obesity (HCC)    Osteoarthritis    PONV (postoperative nausea and vomiting)    Postpartum care following cesarean delivery (12/2) 11/01/2013   Prediabetes    Shortness of breath dyspnea    only asthma related    Sleep apnea    not on CPAP, dx 01/2013 with Outpatietn overnight study at home.    Swelling     Past Surgical History:  Procedure Laterality Date   BREATH TEK H PYLORI N/A 02/22/2015   Procedure: BREATH TEK H PYLORI;  Surgeon: Donnice Lunger, MD;  Location: [facility];  Service: General;  Laterality: N/A;   CESAREAN SECTION     CESAREAN SECTION N/A 11/01/2013   Procedure: REPEAT CESAREAN SECTION;  Surgeon: Dickie DELENA Carder, MD;  Location: [facility];  Service: Obstetrics;  Laterality: N/A;  EDD: 11/04/13   CHOLECYSTECTOMY     LAPAROSCOPIC GASTRIC SLEEVE RESECTION N/A 06/25/2015   Procedure: LAPAROSCOPIC GASTRIC SLEEVE RESECTION;  Surgeon: Donnice Lunger, MD;  Location: [facility];  Service: General;  Laterality: N/A;   PANNICULECTOMY  11/20/2023   stab phlebcetomy  Left 01/31/2021   stab 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 by Carlin Haddock MD    WISDOM TOOTH  EXTRACTION      Family History  Problem Relation Age of Onset   Diabetes Mother    Hypertension Mother    Depression Mother    Anxiety disorder Mother    Sleep apnea Mother    Obesity Mother    Diabetes Father    Hypertension Father    Hyperlipidemia Father    Heart disease Father        cardiomegaly, CHF   Anxiety disorder Father    Obesity Father    Cancer Father 36       lung cancer   Mental illness Father    Hypertension Brother    Diabetes Brother    Hypertension Maternal Grandmother    Hypertension Maternal Grandfather    Hypertension Sister     Social History:  reports that she has never smoked. She has never used smokeless tobacco. She reports current alcohol use of about 7.0 standard drinks of alcohol per week. She reports that she does not use drugs.  Allergies:  Allergies  Allergen Reactions   Bee Venom    Other     Seasonal and enviromental allergies (dust)    Prior to Admission medications   Medication Sig Start Date End Date Taking? Authorizing Provider  acetaminophen  (TYLENOL ) 500 MG tablet Take 1,500 mg by mouth every 6 (six) hours as needed. For pain.  Yes [provider]  Albuterol -Budesonide  (AIRSUPRA ) 90-80 MCG/ACT AERO Inhale 2 puffs into the lungs every 4 (four) hours as needed (coughing, wheezing, chest tightness). Do not exceed 12 puffs in 24 hours. 03/14/24  Yes Luke Orlan HERO, DO  amoxicillin -clavulanate (AUGMENTIN ) 875-125 MG tablet Take 1 tablet by mouth 2 (two) times daily. 07/05/24  Yes Cheryl Reusing, FNP  B Complex Vitamins (VITAMIN B-COMPLEX) TABS Take by mouth.   Yes [provider]  budesonide  (PULMICORT ) 0.5 MG/2ML nebulizer solution During upper respiratory infection/asthma flares use 1 vial via nebulizer twice a day for 1 to 2 weeks 07/05/24  Yes Cheryl Reusing, FNP  budesonide -formoterol  (SYMBICORT ) 160-4.5 MCG/ACT inhaler Inhale 2 puffs into the lungs 2 times daily at 12 noon and 4 pm. 03/02/24  Yes Mannam, Praveen, MD   cetirizine  (ZYRTEC ) 10 MG tablet Take 10 mg by mouth daily. Patient taking differently: Take 10 mg by mouth daily. As needed   Yes [provider]  Dupilumab  (DUPIXENT ) 300 MG/2ML SOAJ Inject 300 mg into the skin every 14 (fourteen) days. **loading dose completed in office on 06/30/2024** 06/30/24  Yes Mannam, Praveen, MD  fexofenadine (ALLEGRA) 180 MG tablet Take 180 mg by mouth daily.   Yes [provider]  fluconazole  (DIFLUCAN ) 150 MG tablet Take 1 tablet (150 mg total) by mouth daily as needed (take at least 3 days between doses as needed for vaginal irritaiton). 06/21/19  Yes Corum, Olam CROME, MD  Fluocinolone  Acetonide Scalp 0.01 % OIL Apply topically to affected areas 05/06/19  Yes Stallings, Zoe A, MD  Fluticasone  Propionate (XHANCE ) 93 MCG/ACT EXHU 1-2 sprays per nostril twice a day. 03/14/24  Yes Luke Orlan HERO, DO  Fluticasone  Propionate (XHANCE ) 93 MCG/ACT EXHU 1-2 sprays each nostril twice a day 03/14/24  Yes Luke Orlan HERO, DO  furosemide  (LASIX ) 20 MG tablet Take 1 tablet (20 mg total) by mouth daily as needed. 12/16/19  Yes Stallings, Zoe A, MD  HYDROcodone -acetaminophen  (HYCET) 7.5-325 mg/15 ml solution  08/17/19  Yes [provider]  ketoconazole  (NIZORAL ) 2 % shampoo Apply 1 application topically 2 (two) times a week. 05/09/19  Yes Stallings, Zoe A, MD  levocetirizine (XYZAL) 5 MG tablet Take 5 mg by mouth every evening.   Yes [provider]  LORazepam  (ATIVAN ) 1 MG tablet Take 1 tablet 30 minutes prior to leaving house on day of office surgery.  Bring second tablet with you to the office. 02/21/21  Yes Fields, Carlin BRAVO, MD  methylPREDNISolone  (MEDROL  DOSEPAK) 4 MG TBPK tablet Take 6 tablets on day 1, 5 tablets on day 2, 4 tabs on day 3, 3 tabs on day 4, 2 tabs on day 5, 1 tab on day 6. Patient taking differently: Take 6 tablets on day 1, 5 tablets on day 2, 4 tabs on day 3, 3 tabs on day 4, 2 tabs on day 5, 1 tab on day 6. As needed 03/14/24  Yes Luke Orlan HERO, DO   metroNIDAZOLE  (METROGEL ) 0.75 % gel Apply 1 application  topically 2 (two) times daily.   Yes [provider]  montelukast  (SINGULAIR ) 10 MG tablet Take 1 tablet (10 mg total) by mouth at bedtime. 05/06/19  Yes Stallings, Zoe A, MD  Multiple Vitamins-Minerals (BARIATRIC MULTIVITAMINS PO) Take 1 capsule by mouth daily.   Yes [provider]  ondansetron  (ZOFRAN -ODT) 4 MG disintegrating tablet DISSOLVE 1 TABLET ON THE TONGUE EVERY 8 (EIGHT) HOURS AS NEEDED FOR NAUSEA FOR UP TO 7 DAYS. 08/17/19  Yes [provider]  ondansetron  (ZOFRAN -ODT) 4 MG disintegrating tablet Take 1 tablet (4 mg total) by mouth every 8 (eight) hours as needed for nausea or vomiting. 08/06/22  Yes Aberman, Caroline C, PA-C  Pancrelipase, Lip-Prot-Amyl, (CREON PO) Take by mouth daily.   Yes [provider]  pantoprazole  (PROTONIX ) 20 MG tablet Take 1 tablet (20 mg total) by mouth daily. 11/28/22  Yes Bernis Ernst, PA-C  Vitamin D , Ergocalciferol , (DRISDOL ) 1.25 MG (50000 UNIT) CAPS capsule TAKE 1 CAPSULE (50,000 UNITS TOTAL) BY MOUTH EVERY 7 (SEVEN) DAYS. 02/06/20  Yes Stallings, Zoe A, MD  sucralfate  (CARAFATE ) 1 g tablet Take 1 tablet (1 g total) by mouth daily as needed. Patient not taking: Reported on 08/04/2024 12/16/19 02/14/20  Stallings, Zoe A, MD  sucralfate  (CARAFATE ) 1 g tablet Take 1 tablet (1 g total) by mouth with breakfast, with lunch, and with evening meal for 14 days. Patient not taking: Reported on 08/04/2024 11/28/22 12/12/22  Bernis Ernst, PA-C    Blood pressure 118/80, pulse 68, SpO2 98%. Exam: General: Communicates without difficulty, well nourished, no acute distress. Head: Normocephalic, no evidence injury, no tenderness, facial buttresses intact without stepoff. Face/sinus: No tenderness to palpation and percussion. Facial movement is normal and symmetric. Eyes: PERRL, EOMI. No scleral icterus, conjunctivae clear. Neuro: CN II exam reveals vision grossly intact.  No nystagmus at  any point of gaze. Ears: Auricles well formed without lesions.  Ear canals are intact without mass or lesion.  No erythema or edema is appreciated.  The TMs are intact without fluid. Nose: External evaluation reveals normal support and skin without lesions.  Dorsum is intact.  Anterior rhinoscopy reveals congested mucosa over anterior aspect of inferior turbinates and intact septum.  No purulence noted. Oral:  Oral cavity and oropharynx are intact, symmetric, without erythema or edema.  Mucosa is moist without lesions. Neck: Full range of motion without pain.  There is no significant lymphadenopathy.  No masses palpable.  Thyroid bed within normal limits to palpation.  Parotid glands and submandibular glands equal bilaterally without mass.  Trachea is midline. Neuro:  CN 2-12 grossly intact.   Procedure:  Flexible Nasal Endoscopy: Description: Risks, benefits, and alternatives of flexible endoscopy were explained to the patient.  Specific mention was made of the risk of throat numbness with difficulty swallowing, possible bleeding from the nose and mouth, and pain from the procedure.  The patient gave oral consent to proceed.  The flexible scope was inserted into the right nasal cavity.  Endoscopy of the interior nasal cavity, superior, inferior, and middle meatus was performed. The sphenoid-ethmoid recess was examined. Edematous mucosa was noted.  No polyp, mass, or lesion was appreciated. Nasal septal deviation with septal spurs noted. Olfactory cleft was clear.  Nasopharynx was clear.  Turbinates were hypertrophied but without mass.  The procedure was repeated on the contralateral side with similar findings.  The patient tolerated the procedure well.   Assessment: 1.  Recurrent rhinosinusitis, with nasal mucosal congestion, nasal septal deviation, and bilateral inferior turbinate hypertrophy. 2.  The patient was treated with multiple courses of antibiotics, systemic steroids, Dupixent , Xhance , Singulair ,  and Xyzal.  Plan: 1.  The physical exam and nasal endoscopy findings are reviewed with the patient. 2.  Continue with her treatment regimen of Dupixent  injections, Xhance , Xyzal, and Singulair . 3.  High-dose prednisone  Dosepak for 12 days. 4.  Sinus CT scan to evaluate the extent of her chronic rhinosinusitis. 5.  The patient will return for reevaluation in 3 weeks.  Aristeo Hankerson W Alrick Cubbage 08/04/2024, 1:20 PM

## 2024-08-05 DIAGNOSIS — J343 Hypertrophy of nasal turbinates: Secondary | ICD-10-CM | POA: Insufficient documentation

## 2024-08-05 DIAGNOSIS — J342 Deviated nasal septum: Secondary | ICD-10-CM | POA: Insufficient documentation

## 2024-08-31 ENCOUNTER — Telehealth (INDEPENDENT_AMBULATORY_CARE_PROVIDER_SITE_OTHER): Payer: Self-pay | Admitting: Otolaryngology

## 2024-08-31 ENCOUNTER — Ambulatory Visit (INDEPENDENT_AMBULATORY_CARE_PROVIDER_SITE_OTHER): Admitting: Otolaryngology

## 2024-08-31 NOTE — Telephone Encounter (Signed)
 Left patient a voice mail. She needs a sinus CT before her f/u appointment with Dr. Karis.

## 2024-09-06 ENCOUNTER — Encounter: Payer: Self-pay | Admitting: Pulmonary Disease

## 2024-09-06 ENCOUNTER — Ambulatory Visit: Admitting: Pulmonary Disease

## 2024-10-05 ENCOUNTER — Encounter: Payer: Self-pay | Admitting: Pulmonary Disease

## 2024-10-05 ENCOUNTER — Ambulatory Visit: Admitting: Pulmonary Disease

## 2024-10-05 VITALS — BP 113/79 | HR 76 | Temp 98.0°F | Ht 63.0 in | Wt 188.0 lb

## 2024-10-05 DIAGNOSIS — J329 Chronic sinusitis, unspecified: Secondary | ICD-10-CM | POA: Diagnosis not present

## 2024-10-05 DIAGNOSIS — J4550 Severe persistent asthma, uncomplicated: Secondary | ICD-10-CM

## 2024-10-05 DIAGNOSIS — Z87891 Personal history of nicotine dependence: Secondary | ICD-10-CM

## 2024-10-05 NOTE — Progress Notes (Signed)
 Amber Daniels    [id number]    1981-06-24  Primary Care Physician:Daniels, [name], MD  Referring Physician: [name], MD [address] [place],  [place] [address]  Chief complaint:   Follow up asthma Started Dupixent  August 2025  HPI: 43 y.o. who  has a past medical history of Allergy, Anemia, Arthritis, Asthma, Depression, Edema, GERD (gastroesophageal reflux disease), Headache, Heart murmur, Hypertension, Morbid obesity (HCC), Osteoarthritis, PONV (postoperative nausea and vomiting), Postpartum care following cesarean delivery (12/2) (11/01/2013), Prediabetes, Shortness of breath dyspnea, Sleep apnea, and Swelling.   Discussed the use of AI scribe software for clinical note transcription with the patient, who gave verbal consent to proceed.  History of Present Illness Amber Daniels is a 43 year old female with asthma who presents with worsening respiratory symptoms and congestion.  Since October 2024, she has experienced worsening respiratory symptoms, initially attributing them to allergies due to weather changes. Despite adjusting her allergy medications, her condition did not improve. In December, she underwent surgery and subsequently developed worsening symptoms after Christmas, leading to a diagnosis of walking pneumonia and an upper respiratory infection at [facility]. In February, she visited the emergency room at [facility] due to severe breathing difficulties and wheezing. She received breathing treatments every four hours, which provided temporary relief. However, her symptoms persisted, leading to a second round of prednisone  and antibiotics. She remains congested, attributing some of her symptoms to pollen exposure.  Her asthma history dates back to the 78s, diagnosed around the age of 27 or 30. She was previously on a steroid inhaler but discontinued it when her asthma was under control. She has not been on a regular inhaler regimen recently, using medications  as needed. She has been on Singulair  for years and uses Flonase  for her allergies.  Her family history includes her father, who had COPD, emphysema, and died of lung cancer. 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 She has two children, aged 65 and 65, who also experience allergy symptoms.  She reports congestion and wheezing, but denies current wheezing during the visit. No current use of a steroid inhaler, previously used as needed.  Dupixent  initiated in August 2025 with significant improvement in symptoms  Interim History Amber Daniels is a 43 year old female with severe persistent asthma who presents for follow-up. She is accompanied by her ten-year-old daughter.  Asthma symptoms and control - Severe persistent asthma with improvement on Dupixent  therapy which was started in August 2025 - Missed Dupixent  dose on October 23rd due to prescription issue; currently out of medication and expects refill on November 11th - Experienced congestion and required breathing treatments for three days with a weather change - No current cough or wheezing - Post-surgical dyspnea with exertion, particularly when using stairs, following surgery on October 20th - Used rescue inhaler (asparaginase) twice in the past week  Upper respiratory symptoms - Chronic sinus congestion - Previous episodes of blood in sputum, attributed to dryness - Dupixent  has reduced sinus congestion - Following up with ENT specialist and awaiting CT scan of sinuses  Current asthma and allergy medications - Symbicort , two puffs twice daily - Singulair  - Asparaginase as rescue inhaler - Dupixent  (currently out of medication)  Relevant pulmonary history: Pets: No pets Occupation: High school software engineer at [employer] Exposures: No mold, hot tub, Jacuzzi.  No feather pillows or comforters Smoking history: Used to smoke in high school  Travel history: No significant travel  history Relevant family history: Father had COPD and emphysema.  He was a smoker.  Outpatient Encounter Medications as of 10/05/2024  Medication Sig   acetaminophen  (TYLENOL ) 500 MG tablet Take 1,500 mg by mouth every 6 (six) hours as needed. For pain.   Albuterol -Budesonide  (AIRSUPRA ) 90-80 MCG/ACT AERO Inhale 2 puffs into the lungs every 4 (four) hours as needed (coughing, wheezing, chest tightness). Do not exceed 12 puffs in 24 hours.   B Complex Vitamins (VITAMIN B-COMPLEX) TABS Take by mouth.   budesonide  (PULMICORT ) 0.5 MG/2ML nebulizer solution During upper respiratory infection/asthma flares use 1 vial via nebulizer twice a day for 1 to 2 weeks   budesonide -formoterol  (SYMBICORT ) 160-4.5 MCG/ACT inhaler Inhale 2 puffs into the lungs 2 times daily at 12 noon and 4 pm.   Dupilumab  (DUPIXENT ) 300 MG/2ML SOAJ Inject 300 mg into the skin every 14 (fourteen) days. **loading dose completed in office on 06/30/2024**   Fluocinolone  Acetonide Scalp 0.01 % OIL Apply topically to affected areas   Fluticasone  Propionate (XHANCE ) 93 MCG/ACT EXHU 1-2 sprays per nostril twice a day.   furosemide  (LASIX ) 20 MG tablet Take 1 tablet (20 mg total) by mouth daily as needed.   HYDROcodone -acetaminophen  (HYCET) 7.5-325 mg/15 ml solution  (Patient taking differently: prn)   ketoconazole  (NIZORAL ) 2 % shampoo Apply 1 application topically 2 (two) times a week.   levocetirizine (XYZAL) 5 MG tablet Take 5 mg by mouth every evening.   LORazepam  (ATIVAN ) 1 MG tablet Take 1 tablet 30 minutes prior to leaving house on day of office surgery.  Bring second tablet with you to the office.   metroNIDAZOLE  (METROGEL ) 0.75 % gel Apply 1 application  topically 2 (two) times daily. (Patient taking differently: Apply 1 application  topically 2 (two) times daily. prn)   montelukast  (SINGULAIR ) 10 MG tablet Take 1 tablet (10 mg total) by mouth at bedtime.   Multiple Vitamins-Minerals (BARIATRIC MULTIVITAMINS PO) Take 1 capsule by  mouth daily.   ondansetron  (ZOFRAN -ODT) 4 MG disintegrating tablet Take 1 tablet (4 mg total) by mouth every 8 (eight) hours as needed for nausea or vomiting.   Pancrelipase, Lip-Prot-Amyl, (CREON PO) Take by mouth daily. (Patient taking differently: Take by mouth daily. prn)   pantoprazole  (PROTONIX ) 20 MG tablet Take 1 tablet (20 mg total) by mouth daily.   sucralfate  (CARAFATE ) 1 g tablet Take 1 tablet (1 g total) by mouth daily as needed.   sucralfate  (CARAFATE ) 1 g tablet Take 1 tablet (1 g total) by mouth with breakfast, with lunch, and with evening meal for 14 days. (Patient taking differently: Take 1 g by mouth with breakfast, with lunch, and with evening meal. prn)   Vitamin D , Ergocalciferol , (DRISDOL ) 1.25 MG (50000 UNIT) CAPS capsule TAKE 1 CAPSULE (50,000 UNITS TOTAL) BY MOUTH EVERY 7 (SEVEN) DAYS.   amoxicillin -clavulanate (AUGMENTIN ) 875-125 MG tablet Take 1 tablet by mouth 2 (two) times daily. (Patient not taking: Reported on 10/05/2024)   cetirizine  (ZYRTEC ) 10 MG tablet Take 10 mg by mouth daily. (Patient not taking: Reported on 10/05/2024)   fexofenadine (ALLEGRA) 180 MG tablet Take 180 mg by mouth daily. (Patient not taking: Reported on 10/05/2024)   fluconazole  (DIFLUCAN ) 150 MG tablet Take 1 tablet (150 mg total) by mouth daily as needed (take at least 3 days between doses as needed for vaginal irritaiton).   Fluticasone  Propionate (XHANCE ) 93 MCG/ACT EXHU 1-2 sprays each nostril twice a day (Patient not taking: Reported on 10/05/2024)  methylPREDNISolone  (MEDROL  DOSEPAK) 4 MG TBPK tablet Take 6 tablets on day 1, 5 tablets on day 2, 4 tabs on day 3, 3 tabs on day 4, 2 tabs on day 5, 1 tab on day 6. (Patient not taking: Reported on 10/05/2024)   ondansetron  (ZOFRAN -ODT) 4 MG disintegrating tablet DISSOLVE 1 TABLET ON THE TONGUE EVERY 8 (EIGHT) HOURS AS NEEDED FOR NAUSEA FOR UP TO 7 DAYS. (Patient not taking: Reported on 10/05/2024)   predniSONE  (STERAPRED UNI-PAK 48 TAB) 10 MG (48) TBPK  tablet Per instructions (6,6,6,6,4,4,4,4,2,2,2,2) (Patient not taking: Reported on 10/05/2024)   No facility-administered encounter medications on file as of 10/05/2024.   Vitals:   10/05/24 1529  BP: 113/79  Pulse: 76  Temp: 98 F (36.7 C)  Height: 5' 3 (1.6 m)  Weight: 188 lb (85.3 kg)  SpO2: 100%  TempSrc: Oral  BMI (Calculated): 33.31     Physical Exam GEN: No acute distress CV: Regular rate and rhythm no murmurs LUNGS: Clear to auscultation bilaterally normal respiratory effort SKIN JOINTS: Warm and dry no rash    Data Reviewed: Imaging: CT abdomen pelvis 11/28/2022-visualized lungs are unremarkable Chest x-ray 01/12/2024-no acute cardiopulmonary abnormality Chest x-ray 07/05/2024-no active cardiopulmonary disease I reviewed the images personally.  PFTs: 04/04/2015 FVC 2.84, FEV1 2.47 [96%), F/F85 Normal test  06/27/2024 FVC 3.26 [92%], FEV1 2.63 [91%], F/F81, TLC 5.58 [113%], DLCO 25.79 [124%] Minimal obstructive airway disease with bronchodilator response  Labs: CBC  01/11/2024-WBC 3.0, eos 7%, absolute eosinophil count 210 03/14/2024-WBC 5.5, eos 8%, absolute eosinophil count 440  RAST panel/14/2025-IgE 356, sensitive to grass pollen, dog dander. Borderline to trees pollen and dust mites  Assessment & Plan Severe persistent asthma Asthma is well-controlled with Dupixent , Symbicort , Singulair , and asparaginase. Recent exacerbation due to weather change and post-surgery activity required a breathing treatment and increased use of asparaginase. Missed one Dupixent  dose due to prescription refill delay, but medication is expected to arrive soon. No recent wheezing or coughing, only mild congestion. Lung function tests show minimal obstruction with a bronchodilator response, consistent with asthma. - Continue Dupixent , Symbicort , Singulair , and asparaginase as prescribed. - Ensure timely refill of Dupixent . - Use asparaginase as needed for acute symptoms. - Scheduled  follow-up in six months.  Chronic sinusitis Previous episodes of blood-tinged mucus. Symptoms have improved with Dupixent , which is effective for both asthma and sinusitis. Awaiting CT scan of sinuses for further evaluation. - Follow up with ENT for CT scan of sinuses. - Continue Dupixent  for sinusitis management.  Recommendations: Continue Symbicort , Dupixent  Continue Singulair , fluticasone  nasal spray, OTC antihistamine  I personally spent a total of 32 minutes in the care of the patient today including preparing to see the patient, getting/reviewing separately obtained history, performing a medically appropriate exam/evaluation, and coordinating care.   Lonna Coder MD [facility] 10/05/2024, 3:45 PM  CC: Amber Crigler, MD

## 2024-10-05 NOTE — Patient Instructions (Signed)
  VISIT SUMMARY: Today, we reviewed your severe persistent asthma and chronic sinusitis. Your asthma is well-controlled with your current medications, although you experienced a recent exacerbation due to a missed Dupixent  dose and post-surgery activity. Your sinus symptoms have improved with Dupixent , and you are awaiting a CT scan for further evaluation.  YOUR PLAN: SEVERE PERSISTENT ASTHMA: Your asthma is well-controlled with your current medications, but you had a recent flare-up due to a missed Dupixent  dose and post-surgery activity. -Continue taking Dupixent , Symbicort , Singulair , and airsupra  as prescribed. -Make sure to get your Dupixent  refill on time. -Use your airsupra  inhaler as needed for acute symptoms. -We will see you again in six months for a follow-up.  CHRONIC SINUSITIS: You have chronic sinus congestion, which has improved with Dupixent . You are awaiting a CT scan for further evaluation. -Continue taking Dupixent  for sinusitis management. -Follow up with your ENT specialist for the CT scan of your sinuses.

## 2024-12-06 ENCOUNTER — Telehealth: Payer: Self-pay

## 2024-12-06 NOTE — Telephone Encounter (Signed)
 Received Dupixent  pa form. Submitted a Prior Authorization request to [facility] for DUPIXENT  via CoverMyMeds. Will update once we receive a response.  Key: [id number]

## 2024-12-12 ENCOUNTER — Other Ambulatory Visit: Payer: Self-pay | Admitting: Pulmonary Disease

## 2024-12-12 DIAGNOSIS — J4550 Severe persistent asthma, uncomplicated: Secondary | ICD-10-CM

## 2024-12-15 NOTE — Telephone Encounter (Signed)
 Received notification from [facility] regarding a prior authorization for DUPIXENT . Authorization has been APPROVED from 12/06/24 to 12/06/25. Approval letter sent to scan center.  Patient must continue to fill through [facility]: [phone]  Authorization # [id number] Phone # [phone]

## 2025-03-27 ENCOUNTER — Encounter

## 2025-03-27 ENCOUNTER — Ambulatory Visit (HOSPITAL_COMMUNITY)
